# Patient Record
Sex: Male | Born: 1940 | Race: White | Hispanic: No | Marital: Married | Smoking: Former smoker
Health system: Southern US, Community
[De-identification: ages and names within clinical notes are randomized; demographics above are authoritative.]

## PROBLEM LIST (undated history)

## (undated) DIAGNOSIS — K219 Gastro-esophageal reflux disease without esophagitis: Secondary | ICD-10-CM

## (undated) DIAGNOSIS — Z85828 Personal history of other malignant neoplasm of skin: Secondary | ICD-10-CM

## (undated) DIAGNOSIS — Z9109 Other allergy status, other than to drugs and biological substances: Secondary | ICD-10-CM

## (undated) DIAGNOSIS — E785 Hyperlipidemia, unspecified: Secondary | ICD-10-CM

## (undated) DIAGNOSIS — H269 Unspecified cataract: Secondary | ICD-10-CM

## (undated) DIAGNOSIS — F329 Major depressive disorder, single episode, unspecified: Secondary | ICD-10-CM

## (undated) DIAGNOSIS — F32A Depression, unspecified: Secondary | ICD-10-CM

## (undated) DIAGNOSIS — I2510 Atherosclerotic heart disease of native coronary artery without angina pectoris: Secondary | ICD-10-CM

## (undated) DIAGNOSIS — M5430 Sciatica, unspecified side: Secondary | ICD-10-CM

## (undated) DIAGNOSIS — T7840XA Allergy, unspecified, initial encounter: Secondary | ICD-10-CM

## (undated) DIAGNOSIS — D649 Anemia, unspecified: Secondary | ICD-10-CM

## (undated) DIAGNOSIS — G587 Mononeuritis multiplex: Secondary | ICD-10-CM

## (undated) DIAGNOSIS — I10 Essential (primary) hypertension: Secondary | ICD-10-CM

## (undated) DIAGNOSIS — C61 Malignant neoplasm of prostate: Secondary | ICD-10-CM

## (undated) DIAGNOSIS — H3530 Unspecified macular degeneration: Secondary | ICD-10-CM

## (undated) DIAGNOSIS — F419 Anxiety disorder, unspecified: Secondary | ICD-10-CM

## (undated) DIAGNOSIS — G4730 Sleep apnea, unspecified: Secondary | ICD-10-CM

## (undated) DIAGNOSIS — M1990 Unspecified osteoarthritis, unspecified site: Secondary | ICD-10-CM

## (undated) HISTORY — PX: BACK SURGERY: SHX140

## (undated) HISTORY — PX: COLONOSCOPY: SHX174

## (undated) HISTORY — DX: Essential (primary) hypertension: I10

## (undated) HISTORY — DX: Anxiety disorder, unspecified: F41.9

## (undated) HISTORY — DX: Atherosclerotic heart disease of native coronary artery without angina pectoris: I25.10

## (undated) HISTORY — DX: Mononeuritis multiplex: G58.7

## (undated) HISTORY — DX: Hyperlipidemia, unspecified: E78.5

## (undated) HISTORY — DX: Unspecified macular degeneration: H35.30

## (undated) HISTORY — DX: Anemia, unspecified: D64.9

## (undated) HISTORY — DX: Allergy, unspecified, initial encounter: T78.40XA

## (undated) HISTORY — DX: Other allergy status, other than to drugs and biological substances: Z91.09

## (undated) HISTORY — PX: POLYPECTOMY: SHX149

## (undated) HISTORY — DX: Unspecified cataract: H26.9

---

## 1996-04-10 HISTORY — PX: ELBOW SURGERY: SHX618

## 1997-09-10 ENCOUNTER — Ambulatory Visit (HOSPITAL_COMMUNITY): Admission: RE | Admit: 1997-09-10 | Discharge: 1997-09-10 | Payer: Self-pay | Admitting: Neurosurgery

## 1997-11-09 ENCOUNTER — Inpatient Hospital Stay (HOSPITAL_COMMUNITY): Admission: RE | Admit: 1997-11-09 | Discharge: 1997-11-12 | Payer: Self-pay | Admitting: Neurosurgery

## 1997-11-18 ENCOUNTER — Inpatient Hospital Stay (HOSPITAL_COMMUNITY): Admission: EM | Admit: 1997-11-18 | Discharge: 1997-11-21 | Payer: Self-pay | Admitting: Emergency Medicine

## 1998-02-01 ENCOUNTER — Encounter: Admission: RE | Admit: 1998-02-01 | Discharge: 1998-05-02 | Payer: Self-pay | Admitting: Neurosurgery

## 2000-04-10 DIAGNOSIS — I2510 Atherosclerotic heart disease of native coronary artery without angina pectoris: Secondary | ICD-10-CM

## 2000-04-10 HISTORY — PX: ANGIOPLASTY: SHX39

## 2000-04-10 HISTORY — DX: Atherosclerotic heart disease of native coronary artery without angina pectoris: I25.10

## 2000-09-24 ENCOUNTER — Encounter: Payer: Self-pay | Admitting: Emergency Medicine

## 2000-09-24 ENCOUNTER — Inpatient Hospital Stay (HOSPITAL_COMMUNITY): Admission: EM | Admit: 2000-09-24 | Discharge: 2000-09-26 | Payer: Self-pay | Admitting: Emergency Medicine

## 2000-09-27 ENCOUNTER — Observation Stay (HOSPITAL_COMMUNITY): Admission: EM | Admit: 2000-09-27 | Discharge: 2000-09-28 | Payer: Self-pay | Admitting: Emergency Medicine

## 2000-09-27 ENCOUNTER — Encounter: Payer: Self-pay | Admitting: Emergency Medicine

## 2000-12-15 ENCOUNTER — Encounter: Payer: Self-pay | Admitting: Internal Medicine

## 2000-12-15 ENCOUNTER — Inpatient Hospital Stay (HOSPITAL_COMMUNITY): Admission: EM | Admit: 2000-12-15 | Discharge: 2000-12-18 | Payer: Self-pay | Admitting: Emergency Medicine

## 2001-01-01 ENCOUNTER — Encounter (HOSPITAL_COMMUNITY): Admission: RE | Admit: 2001-01-01 | Discharge: 2001-04-01 | Payer: Self-pay | Admitting: Cardiology

## 2001-01-02 ENCOUNTER — Ambulatory Visit (HOSPITAL_COMMUNITY): Admission: RE | Admit: 2001-01-02 | Discharge: 2001-01-02 | Payer: Self-pay | Admitting: Cardiology

## 2001-02-05 ENCOUNTER — Encounter: Payer: Self-pay | Admitting: Emergency Medicine

## 2001-02-05 ENCOUNTER — Observation Stay (HOSPITAL_COMMUNITY): Admission: EM | Admit: 2001-02-05 | Discharge: 2001-02-06 | Payer: Self-pay | Admitting: Emergency Medicine

## 2001-07-10 ENCOUNTER — Encounter: Payer: Self-pay | Admitting: Neurosurgery

## 2001-07-10 ENCOUNTER — Encounter: Admission: RE | Admit: 2001-07-10 | Discharge: 2001-07-10 | Payer: Self-pay | Admitting: Neurosurgery

## 2001-07-24 ENCOUNTER — Ambulatory Visit (HOSPITAL_COMMUNITY): Admission: RE | Admit: 2001-07-24 | Discharge: 2001-07-25 | Payer: Self-pay | Admitting: Neurosurgery

## 2001-07-24 ENCOUNTER — Encounter: Payer: Self-pay | Admitting: Neurosurgery

## 2001-08-28 ENCOUNTER — Encounter: Admission: RE | Admit: 2001-08-28 | Discharge: 2001-09-12 | Payer: Self-pay | Admitting: Neurosurgery

## 2001-12-01 ENCOUNTER — Emergency Department (HOSPITAL_COMMUNITY): Admission: EM | Admit: 2001-12-01 | Discharge: 2001-12-01 | Payer: Self-pay | Admitting: Emergency Medicine

## 2001-12-01 ENCOUNTER — Encounter: Payer: Self-pay | Admitting: Emergency Medicine

## 2002-03-13 ENCOUNTER — Ambulatory Visit (HOSPITAL_COMMUNITY): Admission: RE | Admit: 2002-03-13 | Discharge: 2002-03-13 | Payer: Self-pay | Admitting: Family Medicine

## 2002-03-13 ENCOUNTER — Encounter: Payer: Self-pay | Admitting: Family Medicine

## 2002-03-29 ENCOUNTER — Encounter: Payer: Self-pay | Admitting: Emergency Medicine

## 2002-03-29 ENCOUNTER — Inpatient Hospital Stay (HOSPITAL_COMMUNITY): Admission: EM | Admit: 2002-03-29 | Discharge: 2002-03-30 | Payer: Self-pay | Admitting: Emergency Medicine

## 2002-03-31 ENCOUNTER — Ambulatory Visit (HOSPITAL_COMMUNITY): Admission: RE | Admit: 2002-03-31 | Discharge: 2002-03-31 | Payer: Self-pay | Admitting: Neurology

## 2002-03-31 ENCOUNTER — Encounter: Payer: Self-pay | Admitting: Neurology

## 2002-04-20 ENCOUNTER — Inpatient Hospital Stay (HOSPITAL_COMMUNITY): Admission: EM | Admit: 2002-04-20 | Discharge: 2002-04-21 | Payer: Self-pay

## 2003-01-09 ENCOUNTER — Emergency Department (HOSPITAL_COMMUNITY): Admission: EM | Admit: 2003-01-09 | Discharge: 2003-01-09 | Payer: Self-pay | Admitting: Emergency Medicine

## 2003-10-23 ENCOUNTER — Ambulatory Visit (HOSPITAL_COMMUNITY): Admission: RE | Admit: 2003-10-23 | Discharge: 2003-10-23 | Payer: Self-pay | Admitting: Family Medicine

## 2003-11-11 ENCOUNTER — Inpatient Hospital Stay (HOSPITAL_COMMUNITY): Admission: RE | Admit: 2003-11-11 | Discharge: 2003-11-14 | Payer: Self-pay | Admitting: Neurosurgery

## 2004-03-08 ENCOUNTER — Observation Stay (HOSPITAL_COMMUNITY): Admission: EM | Admit: 2004-03-08 | Discharge: 2004-03-09 | Payer: Self-pay | Admitting: Emergency Medicine

## 2005-01-08 ENCOUNTER — Ambulatory Visit (HOSPITAL_COMMUNITY): Admission: RE | Admit: 2005-01-08 | Discharge: 2005-01-08 | Payer: Self-pay | Admitting: *Deleted

## 2005-01-17 ENCOUNTER — Ambulatory Visit (HOSPITAL_COMMUNITY): Admission: RE | Admit: 2005-01-17 | Discharge: 2005-01-18 | Payer: Self-pay | Admitting: Neurosurgery

## 2006-10-08 ENCOUNTER — Inpatient Hospital Stay (HOSPITAL_COMMUNITY): Admission: EM | Admit: 2006-10-08 | Discharge: 2006-10-10 | Payer: Self-pay | Admitting: Emergency Medicine

## 2006-10-08 ENCOUNTER — Ambulatory Visit: Payer: Self-pay | Admitting: *Deleted

## 2009-09-09 ENCOUNTER — Emergency Department (HOSPITAL_COMMUNITY): Admission: EM | Admit: 2009-09-09 | Discharge: 2009-09-09 | Payer: Self-pay | Admitting: Emergency Medicine

## 2010-05-01 ENCOUNTER — Encounter: Payer: Self-pay | Admitting: Family Medicine

## 2010-08-23 NOTE — H&P (Signed)
NAME:  Mark [name], Mark [name] NO.:  [id number]   MEDICAL RECORD NO.:  [id number]          PATIENT TYPE:  EMS   LOCATION:  MAJO                         FACILITY:  [facility]   PHYSICIAN:  Elmore Guise., M.D.DATE OF BIRTH:  05-06-1940   DATE OF ADMISSION:  10/08/2006  DATE OF DISCHARGE:                              HISTORY & PHYSICAL   INDICATION FOR ADMISSION:  Chest pain.   PRIMARY CARDIOLOGIST:  Armanda Magic, M.D.   HISTORY OF PRESENT ILLNESS:  [name] is a very pleasant 70 year old  white male with a past medical history of coronary artery disease,  status post PCI of his mid LAD in 2002 (hypertension, dyslipidemia); who  presents with chest pain.  The patient reports that his symptoms started  this morning and continued throughout the day.  He initially describes  it as an ache in the left chest area, associated with shortness of  breath and diaphoresis.  This progressed to a burning in the left chest,  also associated with shortness of breath, diaphoresis and fatigue.  He  states that the burning was similar to what he has had in the past.   He denies any orthopnea or PND; no palpitations.  He typically tries to  exercise daily, by either walking on the treadmill or exercise bike.  However, lately he has been too tired and too short of breath to do his  normal activities.  He does report increased stress at home.  He denies  any problems with his medications.  No recent fever.  He has had a  nonproductive cough.  He also reports mild cramping in his lower  extremities, and he thinks this is secondary to decreased fluid intake.  He has had no nausea, vomiting or diarrhea.  Review of systems are  otherwise negative.   CURRENT MEDICATIONS:  1. Simcor once daily.  2. Diovan once daily.  3. Aspirin once daily.  4. Zetia once daily.  5. Prozac 40 mg daily.  6. Xanax p.r.n.  7. Hydrochlorothiazide 25 mg daily.   ALLERGIES:  CODEINE.   FAMILY HISTORY:   Positive for heart disease, diabetes mellitus and  hypertension -- all in his mother and brother.   SOCIAL HISTORY:  He is married.  He has a remote history of tobacco,  quit 28 years ago.  He has an occasional alcohol use.   PHYSICAL EXAMINATION:  VITAL SIGNS:  He is afebrile, temperature 97.3;  blood pressure 105/60, heart rate 76 and regular.  He is satting 96% on  room air.  GENERAL:  He is a very pleasant middle-aged white male; alert and  oriented x4 and in no acute distress.  HEENT:  Appears normal  NECK:  Supple, no lymphadenopathy.  Two-plus carotids.  No JVD and no  bruits.  LUNGS:  Clear.  HEART:  Regular with a normal S1, S2.  No significant murmur, gallop or  rub.  ABDOMEN:  Soft and nontender, nondistended.  No rebound or guarding.  EXTREMITIES:  Warm with 2+ pulses and no edema.   ECHOCARDIOGRAM:  Shows normal sinus rhythm.  Normal axis.  Normal  intervals, with no ST or T-wave changes.  This is unchanged from his  prior tracings.   CHEST X-RAY:  Pending at the time of dictation.   LABORATORY STUDIES:  Coags are normal, with a PT 14.1, INR 1.1, PTT 29.  White count 7.1, hemoglobin 13.4, platelet count 269.  Myoglobin 79, MB  1.2, troponin I less than 0.05.  Potassium 3.7, BUN 23, creatinine 1.2.  His last stress test was done over 3 years ago, per patient.  At that  time he was referred for catheterization.   His last catheterization was done April 21, 2002.  The left main was  normal.  The LAD is large and wraps around the left ventricular apex.  There is 20% narrowing of the proximal third of the LAD stent.  The  stent, however, is widely patent.  There is a small diagonal which  arises proximal to the stent and contains an ostial 30-40% narrowing.  Circumflex is widely patent, giving an origin of 4 obtuse marginals;  without any significant obstructive disease.  His RCA has no significant  obstructive disease, just with mild luminal irregularities.  His EF  was  calculated greater than 60%.   IMPRESSION:  1. Chest pain, with typical and atypical qualities.  2. Fatigue.  3. Shortness of breath.  4. Dyslipidemia.  5. History of coronary artery disease, status post percutaneous      coronary intervention of his mid left anterior descending artery.   PLAN:  The patient will be admitted to telemetry bed for rule out  myocardial infarction.  He will be given Lovenox 1 mg/kg subcutaneously  b.i.d.  Continue nitroglycerin drip.  Start low-dose metoprolol, as  blood pressure and heart rate tolerate; as well as continue his statin  at this time.  We will check a TSH, D-dimer and chest x-ray.  Further  measures per Dr. Mayford Knife.      Elmore Guise., M.D.  Electronically Signed     TWK/MEDQ  D:  10/08/2006  T:  10/08/2006  Job:  [id number]   cc:   Armanda Magic, M.D.

## 2010-08-26 NOTE — Discharge Summary (Signed)
NAME:  Mark [name], Mark [name]             ACCOUNT NO.:  [id number]   MEDICAL RECORD NO.:  [id number]          PATIENT TYPE:  INP   LOCATION:  3709                         FACILITY:  [facility]   PHYSICIAN:  Armanda Magic, M.D.     DATE OF BIRTH:  1940/11/30   DATE OF ADMISSION:  10/08/2006  DATE OF DISCHARGE:  10/10/2006                               DISCHARGE SUMMARY   DISCHARGE DIAGNOSES:  Dictation ended at this point.      Guy Franco, P.A.      Armanda Magic, M.D.  Electronically Signed    LB/MEDQ  D:  11/21/2006  T:  11/21/2006  Job:  [id number]

## 2010-08-26 NOTE — Discharge Summary (Signed)
NAME:  Mark [name], Mark [name]                       ACCOUNT NO.:  [id number]   MEDICAL RECORD NO.:  [id number]                   PATIENT TYPE:  INP   LOCATION:  2020                                 FACILITY:  [facility]   PHYSICIAN:  Armanda Magic, M.D.                  DATE OF BIRTH:  1940/04/26   DATE OF ADMISSION:  04/19/2002  DATE OF DISCHARGE:  04/21/2002                                 DISCHARGE SUMMARY   ADMISSION DIAGNOSES:  1. Atypical chest pain, rule out myocardial infarction.  2. Known coronary artery disease, status post LAD stenting 2002.  3. Anxiety disorder.  4. Hypertension.  5. Benign prostatic hypertrophy.  6. Gastroesophageal reflux disease with negative EGD August 2002.  7. Hypertriglyceridemia.   DISCHARGE DIAGNOSES:  1. Atypical chest pain resolved, myocardial infarction ruled out with     negative enzymes.  2. Cardiac catheterization on 04/21/2002 -  widely patent coronary arteries,     patent LAD stent.  3. Known coronary artery disease, status post LAD stenting 2002.  4. Anxiety disorder.  5. Hypertension.  6. Benign prostatic hypertrophy.  7. Gastroesophageal reflux disease with negative EGD August 2002.  8. Hypertriglyceridemia.   HISTORY OF PRESENT ILLNESS:  Mark [name] is 70 year old gentleman with a  history of coronary artery disease, status post mid-LAD stenting 12/17/00 by  Dr. Katrinka Blazing.  He has done well since that time.  However, shortly before  Christmas, he began experiencing left precordial aching discomfort which is  almost continuous.  He saw Dr. Mayford Knife and a Cardiolite was performed at  [facility] which was reportedly abnormal.  As best can be told from  the discussion, it demonstrated abnormality in the bottom wall of the heart.  He had improved again and scheduled to have a cardiac catheterization by Dr.  Fraser Din on 04/22/02.   The patient came to the emergency room early this morning because of  continued and worsening discomfort in the  chest.  He was admitted to rule  out MI.  He was still having some mild discomfort although he states it has  improved on nitroglycerin.  There is no nausea, vomiting or diaphoresis.  Mild aching in the left arm.   The patient will be admitted for atypical chest discomfort.  EKG  unremarkable.  He was placed on heparin per pharmacy and nitroglycerin.   PROCEDURES:  Cardiac catheterization 04/21/02 by Dr. Katrinka Blazing.   COMPLICATIONS:  None.   CONSULTATIONS:  None.   COURSE IN THE HOSPITAL:  Mark [name] was admitted to [facility]  through the emergency room on 04/20/02 for symptoms of atypical chest pain.  EKG was negative.  Initial cardiac enzymes negative.  The only significant  laboratory finding was a potassium of 3.5 which was repleted.   Early on the morning of 04/21/02 the patient had a drop in heart rate to 34 -  not sustained and returned to the  60s.  The patient was asymptomatic and was  in the bathroom at this time.  No further workup was recommended at this  point.   Lipid profile showed a total cholesterol of 164, triglycerides 233, HDL 45  and LDL 72.  Dr. Mayford Knife recommended increasing omega-3 fatty acids to 3  grams t.i.d.  Recheck liver profile  in eight weeks.   On 04/21/02 Dr. Katrinka Blazing performed a cardiac catheterization.  This revealed a  normal LV.  Left main normal, LAD with a 25% in-stent restenosis, circumflex  normal, and RCA without any significant disease.  He recommended continued  medical therapy.  Noncardiac etiology of chest pain may be reflux or  musculoskeletal.  Recommend follow up with primary care Viridiana Spaid.   On 04/21/02 the patient was felt to able to be discharged to home  postcatheterization.  Groin remained stable.   MEDICATIONS:  1. Omega-3 fatty acids 3 grams t.i.d.  2. Zocor 20 mg.  3. Norvasc 7.5 mg.  4. Prozac 20 mg.  5. Diovan.  6. HCT 12.5 mg.  7. Bextra 10 mg.  8. Xanax as needed.  Take as directed.  9. Multivitamins.  10.       Fibercon.  11.      Vitamin E.  12.      Coated aspirin 81 mg a day.   DISCHARGE ACTIVITIES:  No strenuous activity, lifting more than 5 pounds or  driving for two days.  May return to work Thursday.   DIET:  Low-fat, low-cholesterol, low-salt, low-sugar, low-carb diet.   DISCHARGE INSTRUCTIONS:  No shower.   He is asked to call the office with any problems or questions.   FOLLOW UP:  We would like to have the patient see Dr. Mayford Knife back in 2-3  weeks and this will be scheduled before the patient is discharged.  He will  need an eight week followup with a profile.     Georgiann Cocker Jernejcic, P.A.                   Armanda Magic, M.D.    TCJ/MEDQ  D:  04/21/2002  T:  04/21/2002  Job:  [id number]

## 2010-08-26 NOTE — H&P (Signed)
NAME:  Mark [name], Mark [name]                       ACCOUNT NO.:  [id number]   MEDICAL RECORD NO.:  [id number]                   PATIENT TYPE:  INP   LOCATION:  2020                                 FACILITY:  [facility]   PHYSICIAN:  Lesleigh Noe, M.D.            DATE OF BIRTH:  [birth year]   DATE OF ADMISSION:  04/19/2002  DATE OF DISCHARGE:                                HISTORY & PHYSICAL   REASON FOR ADMISSION:  Chest pain.   HISTORY OF PRESENT ILLNESS:  The patient is a very pleasant 70 year old  gentleman who has a history of coronary artery disease. He is status post  mid LAD stent on December 17, 2000, by Dr. Verdis Prime. He has done well  since that time. His symptom related to the LAD was a substernal burning  discomfort.   Since shortly before Christmas he has been experiencing left precordial  aching discomfort that is nearly continuous. He saw Dr. Mayford Knife, his primary  cardiologist about this, and a Cardiolite was performed in the [facility] office was abnormal. As best I can tell from discussion with the  patient, it demonstrated abnormality on the bottom wall of his heart.  He  has been previously scheduled to have cardiac catheterization by Dr. Meade Maw on April 22, 2002.   He came to the emergency room earlier this morning became continued and  worsening discomfort in his chest. He was admitted to rule out myocardial  infarction. He is still having some mild discomfort, although he states it  is improved on nitroglycerin. There is no nausea or vomiting or diaphoresis.  There is some mild aching into his left arm.   ALLERGIES:  None.   PAST MEDICAL HISTORY:  1. Coronary atherosclerotic heart disease, status post mid LAD stent in     September 2002.  2. Anxiety disorder.  3. Hypertension.  4. History of back surgery in 1998 and also in April 2003.  5. Benign prostatic hypertrophy.  6. Gastroesophageal reflux disease with unremarkable  esophagogastroduodenoscopy in August 2002.   CURRENT MEDICATIONS:  1. Norvasc 7.5 mg q.d.  2. Prozac 20 mg q.d.  3. Diovan dose unknown; I believe it is 80 mg q.d.  4. Hydrochlorothiazide 12.5 mg q.d.  5. Zocor 20 mg q.d.  6. Bextra 10 mg q.d.  7. Xanax 0.25 mg p.o. b.i.d. p.r.n. anxiety.  8. Vitamin E.  9. Multiple vitamins.  10.      Fibercon.  11.      Fish oil tablets over-the-counter.   FAMILY HISTORY:  Father died of lung cancer. His mother has multiple medical  problems, including COPD, hypertension, breast cancer and thyroid disease.  His brother had a heart attack at age 31.   SOCIAL HISTORY:  The patient is married. He has 2 sons. He is retired from  [employer].   HABITS:  He discontinued smoking 25 years ago. He drinks alcohol socially.  REVIEW OF SYSTEMS:  He has noticed no impairment in his exertional  tolerance. He states that when he was  on the treadmill in our office, there  was no real change in the discomfort he had been having in his chest. He has  not had orthopnea or PND, no neurological symptoms, no claudication. There  has been no pleuritic component to the chest pain. His appetite has been  good. No hemoptysis. No abdominal discomfort. No blood in his urine or  surgical. Neurological review of systems is also negative.   PHYSICAL EXAMINATION:  GENERAL:  The patient was in no acute distress.  VITAL SIGNS:  Blood pressure 108/60, heart rate 60, respirations 16.  SKIN:  Clear.  HEENT:  Unremarkable.  NECK:  No JVD or carotid bruits.  LUNGS:  Clear.  CARDIAC:  No click, no gallop, no rub, no murmur.  ABDOMEN:  Soft, no tenderness. The liver is not palpable.  EXTREMITIES:  No edema. Pulses 2+ but symmetric in upper and lower  extremities.  NEUROLOGIC:  Examination was normal.   LABORATORY DATA:  An EKG is normal. Laboratory data is basically normal. A  chest x-ray has not yet been reviewed personally.   ASSESSMENT:  Atypical chest discomfort in  this patient with a history of an  anxiety disorder. Recent Cardiolite demonstrates a perfusion abnormality.  Considering his prior history of coronary artery disease, repeat diagnostic  cardiac catheterization seems indicated.   PLAN:  Diagnostic cardiac catheterization April 21, 2002. Continue home  medications, IV nitroglycerin, IV heparin.                                               Lesleigh Noe, M.D.    HWS/MEDQ  D:  04/20/2002  T:  04/20/2002  Job:  [id number]   cc:   Armanda Magic, M.D.  [address]  [place], [place] [address]  Fax: [phone]

## 2010-08-26 NOTE — Op Note (Signed)
NAME:  Mark [name], Mark [name]                       ACCOUNT NO.:  [id number]   MEDICAL RECORD NO.:  [id number]                   PATIENT TYPE:  INP   LOCATION:  3734                                 FACILITY:  [facility]   PHYSICIAN:  Coletta Memos, M.D.                  DATE OF BIRTH:  1940-08-11   DATE OF PROCEDURE:  11/11/2003  DATE OF DISCHARGE:                                 OPERATIVE REPORT   PREOPERATIVE DIAGNOSES:  1. Recurrent disk herniation, left L4-5.  2. Left L5 radiculopathy.   POSTOPERATIVE DIAGNOSES:  1. Recurrent disk herniation, left L4-5.  2. Left L5 radiculopathy.   OPERATION PERFORMED:  1. Redo diskectomy L4-5.  2. Posterolateral arthrodesis, L4-5 with morcelized autograft and morcelized     allograft.  3. Pedicle screw fixation, L4-5, three 6.5 x 45 mm Legacy screws, one 6.5 x     50 mm screw.  4. Transverse lumbar interbody fusion with 9 mm Synthes allograft.  Infuse     also used for posterolateral arthrodesis.   SURGEON:  Coletta Memos, M.D.   ASSISTANT:  Hewitt Shorts, M.D.   ANESTHESIA:  General endotracheal.   INDICATIONS FOR PROCEDURE:  Mark [name] is a 70 year old gentleman who  is a patient well known to me.  He has had two previous operations by me at  L4-5 for disk herniations.  He has also had one at L3-4.  He has a recurrent  disk at L4-5 two years after his last operation.  I therefore recommended  and he agreed to undergo a fusion secondary to the multiple recurrences.  He  has no history of back pain.  He did develop a foot drop on the left side  after an epidural injection prior to his second operation.   DESCRIPTION OF PROCEDURE:  Mark [name] was brought to the operating room  intubated and placed under general anesthetic without difficulty.  He was  rolled prone on a Wilson frame after Foley catheter placement under sterile  conditions.  All pressure points were properly padded on body rolls. His  back was prepped and he was draped  in a sterile fashion.  I infiltrated 20  mL 0.5% lidocaine 1:200,000 strength epinephrine  into the lumbar region and  paraspinous musculature.  I opened the old incision using that as a guide  and identified the L4-L5 interlaminar space.  I then exposed the transverse  processes of L4 and L5 bilaterally.  Working from the left side, we already  had a foot drop.  I performed a diskectomy and inferior facetectomy of L4 on  the left.  I drilled out a good portion of the superior facet of L5 in order  to gain lateral access to the disk space.  Both the L4 and L5 nerve roots  were very well and aggressively decompressed.  I sized the disk space after  preparing it with rasps, curets and other instruments  for a 9 mm graft.  I  placed a 9 mm graft without difficulty.  I then took an AP fluoroscopic film  that showed the plug in good position.  I then started with the pedicle  screw fixation using fluoroscopic guidance.  Each screw was done in a  similar fashion.  First a small drill hole was performed.  Then I probed the  pedicle with a pedicle probe and then I tapped and placed a screw in between  each instrument going into the pedicle.  I used a small pedicle finder to  ensure that I had no breech of the pedicle itself.  Two screws were placed  at L5, two screws were placed at L4 without difficulty.  With Dr. Earl Gala  assistance, we placed the rods and locked the screws into position.  Allograft mixed with InFuse was placed laterally over the transverse  processes after they had been decorticated at L4 and L5 and also the lamina  of L4 and L5 on the right side since no laminectomy had been performed.  I  then reapproximated the thoracolumbar fascia, subcutaneous tissue, skin  edges.  Dermabond used for a sterile dressing.  The patient tolerated the  procedure well, moving all extremities postoperatively.                                               Coletta Memos, M.D.    KC/MEDQ  D:   11/11/2003  T:  11/12/2003  Job:  [id number]

## 2010-08-26 NOTE — Cardiovascular Report (Signed)
[place]  Patient:    Mark, [name] Visit Number: [id number] MRN: [id number]          Service Type: MED Location: 3700 [phone] Attending Physician:  Armanda Magic Dictated by:   Armanda Magic, M.D. Proc. Date: 12/17/00 Admit Date:  12/15/2000                          Cardiac Catheterization  PRIMARY CARDIOLOGIST:  Armanda Magic, M.D.  PROCEDURE PERFORMED:  A left heart catheterization, coronary angiography and left ventriculography.  INDICATIONS: Chest pain and coronary disease.  COMPLICATIONS: None.  INTRAVENOUS ACCESS: Via right femoral artery, 6 French sheath.  HISTORY OF PRESENT ILLNESS:  This is a 70 year old, white male, with a previous history of atypical chest pain, status post cardiac catheterization in June of 2002 revealing a 60-70% mid LAD at the takeoff of the first diagonal.  Cardiolite revealed no inducible ischemia and the patient was discharged to home, felt to be secondary to a combination of anxiety, as well as GI etiology. He underwent endoscopy later with no evidence of esophagitis or gastritis and no GI etiology of chest pain. He now presents with recurrent chest pain and ruled out for myocardial infarction with serial enzymes.  He now presents for cardiac catheterization.  DESCRIPTION OF PROCEDURE: The patient is brought to the cardiac catheterization laboratory in a fasting, nonsedated state.  Informed consent was obtained.  The patient was connected to continuous heart rate and pulse oximetry monitoring, and intermittent blood pressure monitoring.  The right groin was prepped and draped in a sterile fashion.  Lidocaine 1% was used for local anesthesia.  Using the modified Seldinger technique, a 6 French sheath was placed in the right femoral artery.  Under fluoroscopic guidance, a 6 French Judkins JL4 catheter was placed in the left coronary artery.  Multiple cine films were taken in the RAO 30 degree, LAO 40  degree views.  This catheter was then exchanged out over a guide wire for a 6 [place] JR4 catheter which was placed in the right coronary artery under fluoroscopic guidance. Multiple cine films were taken in a 30 degree, 40 degree LAO views.  This catheter was then exchanged out over a guide wire for a 6 French angled pigtail catheter which was placed in the left ventricular cavity.  Left ventriculography was performed in a 30 degree RAO view using a total of 30 cc of contrast at 13 cc/sec. The catheter was then pulled back across the aortic valve with no significant gradient noted.  The catheter was then removed over a guide wire.  The sheath was then placed to arterial pressure monitoring. The patient subsequently went on to PCI per Dr. Katrinka Blazing.   RESULTS: 1. The left main coronary artery is widely patent throughout its course and    bifurcates into the left anterior descending artery and left circumflex    artery. 2. The left anterior descending artery is widely patent in its proximal    portion and then gives rise to a first diagonal branch which is widely    patent.  Immediately after the takeoff of the first diagonal branch there    is an 80-90% narrowing of the LAD.  The rest of the LAD is widely patent    throughout its course. 3. Left circumflex is widely patent throughout its course and gives rise to a    first large obtuse marginal branch which is widely patent and  a second    bifurcating large obtuse marginal branch which is widely patent as well. 4. Right coronary artery is widely patent throughout its course and terminates    into a posterior descending artery and posterolateral artery, both of which    are widely patent.  LEFT VENTRICULOGRAM: The left ventriculography performed in a 30 degree RAO view using a total of 30 cc of contrast at 13 cc/sec. showed normal left ventricular size and systolic function.  No mitral regurgitation. Aortic pressure was 160/89, LV pressure  149/8.  ASSESSMENT: 1. One-vessel obstructive coronary disease. 2. Normal left ventricular function.  PLAN: PCI per Dr. Katrinka Blazing. Start Zocor 20 mg a day for LDL of 108.  PLAN: Dictated by:   Armanda Magic, M.D. Attending Physician:  Armanda Magic DD:  12/17/00 TD:  12/17/00 Job: [id number]

## 2010-08-26 NOTE — H&P (Signed)
NAME:  Mark [name], Mark [name] NO.:  [id number]   MEDICAL RECORD NO.:  [id number]          PATIENT TYPE:  INP   LOCATION:  1829                         FACILITY:  [facility]   PHYSICIAN:  Ulyses Amor, MD DATE OF BIRTH:  1940-05-30   DATE OF ADMISSION:  03/08/2004  DATE OF DISCHARGE:                                HISTORY & PHYSICAL   Royalty Domagala is a 70 year old white male who is 70 year old white male  who is admitted to [facility] for further evaluation of chest pain.   The patient has a history of coronary artery disease which dates back to  2002. At that time he underwent stenting of the mid LAD. This was performed  because of presentation with chest pain. The patient has not experienced any  further cardiac events since then. He has been hospitalized on two occasions  since the chest pain. An acute myocardial infarction was excluded on each  occasion. His last cardiac catheterization was during an admission in  January 2004. This demonstrated a patent LAD stent, though approximately 25%  in-stent re-stenosis was noted. Left ventricular function  was normal. There  was no other significant coronary artery disease. It was felt that his chest  pain was noncardiac in origin.   The patient presented to the emergency department this evening after  experiencing chest discomfort which began this morning. It was described as  an ache in his left upper anterolateral chest and it was associated with  numbness in his left arm. It began while he was eating brunch. It continued  throughout the course of the day. He took a total of five nitroglycerin  tablets over the course of the day, but they had no impact on his chest  pain. The chest pain appeared not to be related to position, activity,  meals, or respirations. There were no exacerbating or ameliorating factors.  The chest discomfort eventually resolved after approximately five hours. He  is free of chest  discomfort at this time.  He notes that his chest pain was  different in quality from the chest pain which preceded his stent.   There is no history of myocardial infarction, congestive heart failure, or  arrhythmia.   The patient has a history of hypertension and dyslipidemia. There is no  history of diabetes mellitus. He discontinued smoking approximately 25 years  ago. There is a strong family history of early coronary artery disease; a  brother suffered from coronary artery disease in his 65s.   The patient's past medical history is otherwise notable for gastroesophageal  reflux, benign prostatic hypertrophy, anxiety, and multiple back operations.   The patient's medications include Diovan, nitroglycerin, Norvasc, potassium  chloride, hydrochlorothiazide, Xanax, fluoxetine, aspirin, Advicor, and  Zetia.   ALLERGIES:  None.   PAST SURGICAL HISTORY:  Multiple prior back operations.   SIGNIFICANT INJURIES:  None.   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 He lives  with his wife. He drinks occasionally. He does not smoke.   FAMILY HISTORY:  Early coronary artery disease in a brother as described  above.  REVIEW OF SYSTEMS:  No new problems related to his head, eyes, ears, nose,  mouth, throat, lung, gastrointestinal system, genitourinary system, or  extremities. There is no history of neurologic or psychiatric disorder.  There is no history of fever, chills, or weight loss.   PHYSICAL EXAMINATION:  VITAL SIGNS: Blood pressure is 187/63, pulse 64 and  regular, respirations 20, temperature 97.0.  GENERAL: The patient is a middle-age white male in no discomfort. He is  alert, oriented, appropriate, and responsive.  HEENT: Normal. The neck is without thyromegaly or adenopathy.  NECK: Carotid pulses are palpable bilaterally and without bruits.  CARDIAC: Normal S1 and S2. There is no S3, S4, murmur, rub, or click.  Cardiac rhythm is regular. No chest wall  tenderness noted.  LUNGS: Clear.  ABDOMEN: Soft and nontender. There is no mass, hepatosplenomegaly, bruits,  distention, rebound, guarding, or rigidity. Bowel sounds are normal.  RECTAL/GENITAL EXAM: Not performed as they are not pertinent to the reason  for acute care  hospitalization.  EXTREMITIES: Without edema, deviation, or deformity. Radial and dorsalis  pedis pulses are palpable bilaterally.  NEUROLOGIC: Re-screening neurologic survey is unremarkable.   Electrocardiogram is normal. Chest radiographic had not yet been performed  at the time of this dictation.   The initial set of cardiac markers revealed a myoglobin of 88.0, CK-MB of  1.3, troponin less than 0.05. The second set of cardiac markers revealed a  myoglobin of 64.9, CK-MB 1.2, and troponin less than 0.05.  White count 6.3  with hemoglobin of 13.3, hematocrit 37.5. Potassium is 3.6, BUN 20, and  creatinine 1.0. The remaining studies are pending at the time of this  dictation.   IMPRESSION:  1.  Chest pain; rule out angina. The pain is different in quality for his      pre-stent chest pain.  2.  Coronary artery disease. Status post left anterior descending stent in      2002. Last cardiac catheterization in January 2004 revealed a patent      stent, albeit with a 25% in-stent re-stenosis.  3.  Hypertension.  4.  Dyslipidemia.  5.  Gastroesophageal reflux disease.  6.  Benign prostatic hypertrophy.  7.  Anxiety.  8.  Status post multiple back operations.   PLAN:  1.  Telemetry.  2.  Serial cardiac enzymes.  3.  Aspirin.  4.  Lovenox.  5.  Intravenous nitroglycerin.  6.  Further measures per Dr. Mayford Knife.      Mitc   MSC/MEDQ  D:  03/08/2004  T:  03/09/2004  Job:  [id number]   cc:   Armanda Magic, M.D.  [address], [address]  [place], [place] [address]  Fax: [phone]

## 2010-08-26 NOTE — Discharge Summary (Signed)
[place]. [facility]  Patient:    Mark [name], Mark [name] Visit Number: [id number] MRN: [id number]          Service Type: MED Location: [phone] Attending Physician:  Armanda Magic Dictated by:   Anselm Lis, N.P. Admit Date:  12/15/2000 Discharge Date: 12/18/2000   CC:         Meredith Staggers, M.D., [facility]   Discharge Summary  DATE OF BIRTH:  [birth year].  PROCEDURES: 1. December 17, 2000, cardiac catheterization per Armanda Magic, M.D.,    revealing single vessel CAD with 80% to 90% mid LAD. 2. December 17, 2000, stent LAD by Darci Needle, M.D.  [facility] DIAGNOSIS/BRIEF HOSPITAL COURSE:  #1 - CORONARY ATHEROSCLEROTIC HEART DISEASE:  Single vessel.  The patient admitted with prolonged chest discomfort, some radiation to left arm and jaw, also some moving around in the chest as well.  Improvement of discomfort on IV nitrate and with morphine.  States symptoms similar to presentation in June of 2002, at which time he underwent cardiac catheterization which revealed borderline significant 70% left anterior descending.   A follow-up stress Cardiolite was negative.  The patient ruled out for myocardial infarction by negative serial cardiac enzymes.  His EKG and chest x-ray were negative.  The patient continued with complaint of chest discomfort, thus was taken for cardiac catheterization December 17, 2000, revealing 80 to 90% mid left anterior descending with normal left ventricular function.  Subsequent stent successfully performed on that culprit area by Dr. Verdis Prime.  The following day, the patient noted chest discomfort significantly improved, although there was still some slight twinge pressure.  It is questioned whether the left anterior descending was the true culprit of the patients atypical discomfort.  The patient was discharged home after ambulating up and about with good tolerance.  He was started on Zocor  with plans for fasting Statin profile in about six weeks. His baseline liver function tests were within normal range.  #2 - HISTORY OF ANXIETY:  On Prozac.  #3 - HISTORY OF HYPERTENSION:  Good control on current medical regimen.  #4 - HISTORY OF BACK SURGERY IN 1998, LEFT ELBOW TENDINITIS IN 1997.  #5 - BENIGN PROSTATIC HYPERTROPHY.  #6 - RECENT ESOPHAGOGASTRODUODENOSCOPY:  This was done by Barbette Hair. Arlyce Dice, M.D., which was unremarkable in August of 2002.  PLAN: 1. The patient discharged home in stable condition. 2. Discharge medications:    a. Glucosamine chondroitin once daily.    b. (New) Plavix 75 mg one p.o. q.d. x 3 weeks, take with food.    c. (New) Zocor 20 mg p.o. q.h.s.    d. Prozac 20 mg capsule q.d.    e. Enteric coated aspirin 325 mg once daily.    f. Norvasc 7.5 mg; one half of a 5 mg tablet once daily.    g. Multivitamin and vitamin E once daily.    h. Nitroglycerin tablet 0.4 mg sublingual p.r.n. chest pain q.5       minutes, up to three tablets in 15 minutes.    iSena Slate once daily two tablets. 3. Activity:  No heavy lifting or pushing greater than 10 to 15 pounds    or driving for two days then as before. 4. Diet:  Low fat, low cholesterol. 5. Wound care:  May shower. 6. Special instructions:  The patient to call our clinic if he develops a    large amount of swelling or bruising in the  groin area. 7. Follow-up:  Armanda Magic, M.D., Thursday, December 27, 2000, at 10:30    a.m.  The patient will have a fasting Statin profile Monday,    January 28, 2001, in the morning.  Follow-up of cholesterol/LFTs with    initiation of Zocor therapy.  LABORATORY TESTS AND DATA:  Admission sodium 136, potassium 3.6, subsequent measurement 3.8.  Chloride 105, CO2 23, glucose 118, BUN 16, creatinine 1.1; subsequently 16 and 0.9, respectively.  Calcium and LFTs within normal range. Serial cardiac enzymes were negative x 4 sets.  Admission wbc 6.1, hemoglobin 14.2,  hematocrit 40.2, platelets 291.  Admission coags were within normal range.  Chest x-ray revealed no active disease.  Admission chest x-ray revealed sinus rhythm without ischemic changes. Dictated by:   Anselm Lis, N.P. Attending Physician:  Armanda Magic DD:  01/23/01 TD:  01/24/01 Job: [id number]

## 2010-08-26 NOTE — Cardiovascular Report (Signed)
[place]. [facility]  Patient:    Mark [name], Mark [name]                    MRN: [id number] Proc. Date: 09/25/00 Adm. Date:  [id number] Attending:  Anastasio Auerbach CC:         Anastasio Auerbach, M.D.  Meredith Staggers, M.D.   Cardiac Catheterization  REFERRING PHYSICIAN:  Anastasio Auerbach, M.D.  CHIEF COMPLAINT:  Chest pain.  PROCEDURE:  Left heart catheterization.  OPERATOR:  Armanda Magic, M.D.  INDICATIONS:  Chest pain with positive cardiac risk factors.  COMPLICATIONS:  None.  IV ACCESS:  Via right femoral artery.  HISTORY:  This is a 70 year old white male with no previous cardiac history, but with a strong family history of coronary disease at an early age, who presents with typical angina.  He ruled out for myocardial infarction by serial enzymes.  He now presents for cardiac catheterization.  DESCRIPTION OF PROCEDURE:  The patient was taken to the cardiac catheterization laboratory in the fasting nonsedated state.  Informed consent was obtained.  The patient was connected to continuous heart rate and pulse oximetry monitoring and intermittent blood pressure monitoring.  The right and left groins were prepped and draped in a sterile fashion.  One percent Xylocaine was used for local anesthesia.  Using the modified Seldinger technique, a 6-French sheath was placed in the right femoral artery.  Under fluoroscopic guidance a 6-French JL-4 catheter was placed in the left coronary artery.  Multiple cine films were taken in 30 degree LAO and 40 degree RAO views.  This catheter was then exchanged out over a guide wire for a 6-French angled pigtail catheter, which was placed in the left ventricular cavity. Left ventriculography was performed in the 30 degree RAO view.  The catheter was then pulled back across the aortic valve.  No significant pressure gradient was obtained.  The catheter was then exchanged out over a guide wire. At the end of the procedure, all  catheters and sheaths were removed.  Manual compression was performed until adequate hemostasis was obtained.  The patient was transferred back to his room in stable condition.  RESULTS: 1. The left main coronary artery is widely patent throughout its course and    bifurcates into a left anterior descending and left circumflex artery. 2. The left anterior descending artery gives rise to one large diagonal branch    which is widely patent.  The mid LAD has a 70% narrowing and then is patent    throughout the rest of its course. 3. The left circumflex artery is widely patent throughout its course and gives    rise to a first large obtuse marginal branch which is widely patent and    then a second bifurcating very large obtuse marginal branch which is widely    patent.  The left circumflex is widely patent throughout its course. 4. The right coronary artery is widely patent throughout its course, giving    rise to an acute marginal branch and then bifurcating into a posterior    descending artery and posterolateral artery. 5. Left ventriculography performed in the 30 degree RAO view showed normal    left ventricular function.  Left ventricular pressure was 194/21 mmHg.    Aortic pressure was 194/107 mmHg.  IMPRESSION: 1. One-vessel obstructive coronary artery disease. 2. Normal left ventricular function.  PLAN:  Review of films by Dr. Amil Amen in the morning with probable PCI stent in the morning.  Continue Plavix, aspirin, heparin, nitroglycerin, and oxygen. We will start Norvasc 5 mg a day for blood pressure control. DD:  09/25/00 TD:  09/25/00 Job: [id number]

## 2010-08-26 NOTE — Discharge Summary (Signed)
[place]. [facility]  Patient:    Mark [name], Mark [name]                    MRN: [id number] Adm. Date:  [id number] Disc. Date: [id number] Attending:  Armanda Magic CC:         Meredith Staggers, M.D.  Armanda Magic, M.D.   Discharge Summary  DATE OF BIRTH:  24-Mar-1941  DISCHARGE DIAGNOSES: 1. Atypical chest pain.    a. Cardiac catheterization, 60-70% left anterior descending.    b. Exercise Cardiolite, no evidence of ischemia or scar, normal ejection       fraction. 2. Questionable gastroesophageal reflux disease. 3. Hyperglycemia, on D-5 intravenous fluids.    a. Glycohemoglobin 5.5%. 4. Mild dyslipidemia.    a. Total cholesterol 187, LDL 108. 5. Chronic low back pain.    a. Radiating symptoms to the left leg.    b. Status post surgery by Dr. Franky Macho, 1999. 6. Left elbow tendinitis. 7. Benign prostatic hypertrophy. 8. Former smoker, quit 22 years ago.  DISCHARGE MEDICATIONS: 1. Enteric-coated aspirin 325 mg q.d. 2. Protonix 40 mg 1 p.o. b.i.d. x 2 weeks, then 1 p.o. q.d. 3. Imdur 30 mg q.d. 4. Norvasc 5 mg q.d. 5. Nitroglycerin 0.4 sublingual p.r.n. chest pain. 6. Multivitamin daily. 7. Tylenol as needed for pain. *  No Bextra until follow-up with Dr. Dayton Scrape (COX-2 inhibitor).  CONDITION ON DISCHARGE:  Stable.  DISPOSITION:  Home with family.  ACTIVITY:  As tolerated.  DIET:  Low fat, low salt.  FOLLOW-UP: 1. Dr. Arsenio Loader on Wednesday, October 03, 2000, at 9:10.  Reassess epigastric    burning.  If persistent, consider GI referral.  If burning has subsided,    consider restarting Bextra for chronic arthritis pains.  Check blood    pressure. 2. The patient is to contact Dr. Fernanda Drum office to schedule an    appointment in approximately two weeks.  She will be following along for    his mild coronary artery disease.  CONSULTATIONS:  Dr. Armanda Magic.  PROCEDURES: 1. Chest x-ray:  Normal. 2. Cardiac catheterization:  With  60-70% LAD. 3. Exercise Cardiolite:  No ischemia.  No evidence of scar.  Normal ejection    fraction.  HOSPITAL COURSE: #1 - ATYPICAL CHEST PAIN:  [name] is a 70 year old Caucasian gentleman with no history of heart disease who presents with a burning sensation in the anterior part of his chest.  It began on the afternoon prior to presentation. It awakened him early this morning with increased symptoms.  He felt a fullness in his left arm this morning and in his neck.  He had no associated nausea, shortness of breath, or diaphoresis.  His wife noted him to be pale and somewhat fatigued.  On presentation his pain was a 3/10 and, after two nitroglycerin and a GI cocktail, it was pretty much the same.  In the emergency room it was not aggravated by movement, and I could not reproduce it with palpation.  Because of his remote history of tobacco use and the fact that his brother had a heart attack at 18, we felt like he needed to be admitted for serial cardiac enzymes.  EKG and enzymes remained normal. Dr. Mayford Knife was consulted and proceeded with cardiac catheterization.  He did have a 60-70% LAD.  It was unclear as to whether or not this was causing his symptoms, so we proceeded with a stress Cardiolite.  [name] performed  well into capacity, and images revealed no evidence of ischemia or previous infarction and a normal ejection fraction.  [name] was still having mild symptoms of chest discomfort, and I suspected this may be due to some acid reflux after being started on Bextra.  I would use proton pump inhibitors twice a day for two weeks and then once daily.  He is going to see Dr. Dayton Scrape in one week and, if he is still having persistent symptoms, would consider GI referral.  #2 - HYPERTENSION:  [name] presented with a blood pressure of 161/92, with a heart rate of 66.  He was obviously in some discomfort.  He was placed on Norvasc and ultimately Imdur with his mild  coronary artery disease.  I would also encourage him to continue on an aspirin daily.  #3 - HYPERGLYCEMIA:  [name] had elevated sugars, in the 140-150 range on D-5 IV fluids.  We stopped this, and his CBGs were normal, and his glycohemoglobin was normal at 5.5%.  #4 - DYSLIPIDEMIA:  [name] has mild dyslipidemia with a total cholesterol of 187 and an LDL of 108.  I would start with diet therapy unless cardiology feels strongly otherwise.  Follow up per primary care and cardiology.  PERTINENT AND DISCHARGE LABORATORY DATA:  TSH 1.395.  Hemoglobin 14.8, WBC 8300, platelet count 232.  Sodium 139, potassium 3.6, chloride 109, bicarbonate 28, BUN 14, creatinine 0.9, glucose 106.  LFTs normal. DD:  09/27/00 TD:  09/29/00 Job: [id number]

## 2010-08-26 NOTE — Discharge Summary (Signed)
NAME:  Mark [name], Mark [name]                       ACCOUNT NO.:  [id number]   MEDICAL RECORD NO.:  [id number]                   PATIENT TYPE:  INP   LOCATION:  3734                                 FACILITY:  [facility]   PHYSICIAN:  Coletta Memos, M.D.                  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   PROCEDURE:  1.  L4-L5 ________.  2.  L4-L5 posterolateral arthrodesis, L4-L5 pedicle screw fixation      morselized auto and allograft.   DISCHARGE STATUS:  Alive and well.   DISCHARGE DISPOSITION:  Home.   DISCHARGE MEDICATIONS:  Percocet for pain.   HOSPITAL COURSE:  Mr. Mark [name] is a gentleman who presented with  recurrent disk herniation.  I recommended and he agreed to undergo operative  decompression and fusion secondary to that.  He was brought to the operating  room and had an uncomplicated procedure.  Postoperatively, he was doing  quite well. Neurologic examination was unchanged.  His wound was clean and  dry without signs of infection at discharge.  He was given instructions and  is to call for a return appointment.                                                Coletta Memos, M.D.    KC/MEDQ  D:  12/11/2003  T:  12/12/2003  Job:  [id number]

## 2010-08-26 NOTE — Cardiovascular Report (Signed)
[place]. [facility]  Patient:    Mark [name], Mark [name] Visit Number: [id number] MRN: [id number]          Service Type: MED Location: 2000 2030 01 Attending Physician:  Armanda Magic Dictated by:   Armanda Magic, M.D. Proc. Date: 02/05/01 Admit Date:  02/05/2001 Discharge Date: 02/06/2001   CC:         Meredith Staggers, M.D.   Cardiac Catheterization  PRIMARY CARE PHYSICIAN: Meredith Staggers, M.D.  PROCEDURE PERFORMED: Coronary angiography.  INDICATIONS: Chest pain and known coronary disease with previous PTCA stent of the LAD approximately 2 months ago.  INTERVENOUS ACCESS: Via right femoral artery, 6 French sheath.  COMPLICATIONS: None.  HISTORY OF PRESENT ILLNESS: This is a 70 year old white male with a history of coronary disease, status post PTCA stenting of the LAD on December 17, 2000, who now presents with recurrent chest pain identical to what he had prior to his stent. Initial cardiac enzymes were negative. ECG shows no ischemia.  DESCRIPTION OF PROCEDURE: The patient is brought to the cardiac catheterization laboratory in a fasting, nonsedated state.  Informed consent was obtained.  The patient was connected to continuous heart rate and pulse oximetry monitoring, and intermittent blood pressure monitoring. The right groin was prepped and draped in a sterile fashion.  Xylocaine 1% was used for local anesthesia.  Using the modified Seldinger technique, a 6 French sheath was placed in the right femoral artery.  Under fluoroscopic guidance, a 6 [place] JL4 catheter was placed in the left coronary artery. Multiple cine films were taken in 30 degree RAO, 40 degree LAO views. This catheter was then exchanged out over a guide wire for a 6 [place] JR4 catheter, which was placed under fluoroscopic guidance into the right coronary artery. Multiple cine films were taken in a 30 degree RAO, 40 degree LAO views. Left ventriculography was not  performed since the patient had recently had this done and his coronary arteries were normal at this catheterization.  At the of the procedure all catheters and sheaths were removed. Manual compression was performed until adequate hemostasis was obtained. The patient was transferred back to her room in stable condition.  RESULTS: 1. The left main coronary artery is widely patent and bifurcates into a    left anterior descending artery. 2. The left anterior descending artery is widely patent throughout its    course and gives rise to two diagonal branches, both of which are    widely patent. The left anterior descending artery stent is widely patent. 3. Left circumflex is widely patent throughout its course and actually    gives rise to a very large branching obtuse marginal which has three    branches, all of which are widely patent. 4. Right coronary artery is widely patent throughout its course. It gives    rise to two acute marginal branches then bifurcates distally in the    posterior descending artery and posterolateral artery both of which are    widely patent.  IMPRESSION: 1. History of coronary disease, status post percutaneous transluminal coronary    angioplasty and stenting of the left anterior descending in September of    2002. 2. Recurrent atypical chest pain identical to previous chest pain prior to    a stent with negative cardiac enzymes x1 and normal ECG. 3. Normal coronary arteries with patent stent of the left anterior descending.  PLAN: Discharge to home after bed rest, IV fluid, hydration, continue current medications. Protonix 40 mg  a day.  Followup with primary MD to discuss treatment of anxiety which is most likely the etiology of his chest pain since he has had an EGD as well which was negative. Dictated by:   Armanda Magic, M.D. Attending Physician:  Armanda Magic DD:  02/05/01 TD:  02/06/01 Job: [id number]

## 2010-08-26 NOTE — Op Note (Signed)
. [facility]  Patient:    Mark [name], Mark [name] Visit Number: [id number] MRN: [id number]          Service Type: SUR Location: 3000 3014 01 Attending Physician:  Coletta Memos Dictated by:   Mena Goes. Franky Macho, M.D. Proc. Date: 07/24/01 Admit Date:  07/24/2001 Discharge Date: 07/25/2001                             Operative Report  PREOPERATIVE DIAGNOSIS: 1. 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 2. Left L5 radiculopathy.  POSTOPERATIVE DIAGNOSIS: 1. 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 2. Left L5 radiculopathy.  OPERATION PERFORMED:  Left L4-5 redo semihemilaminectomy and diskectomy with microdissection.  COMPLICATIONS:  None.  SURGEON:  Kyle L. Franky Macho, M.D.  ASSISTANT:  Tanya Nones. Jeral Fruit, M.D.  ANESTHESIA:  General endotracheal.  INDICATIONS FOR PROCEDURE:  The patient is a 70 year old gentleman who started having pain in his left buttocks approximately March of 2002.  This got worse and an MRI showed a recurrent disk.  He underwent epidural injection and that may have though it is not clear how prompted progression of weakness in his left dorsiflexors.  He had had a normal exam prior to the steroid injections but it is not at all clear that the steroid injections caused this.  This was only after that time that he noticed the foot drop on the left.  I therefore recommended that he undergo urgent decompression and he has agreed.  DESCRIPTION OF PROCEDURE:  The patient was brought to the operating room, intubated and placed under general anesthesia without difficulty.  He was rolled prone onto a Wilson frame and all pressure points were properly padded. His back was prepped with DuraPrep.  I made a skin incision after infiltrating 6 cc of 0.5% lidocaine 1:200,000 strength epinephrine.  I then exposed the lamina of what I believe to be L4 and 5.  I took an x-ray and they were indeed the lamina of L4 and 5.  Knowing that I was at the correct space, I  then used curets to free the bony edges from soft tissue.  The thecal sac was then clearly identified.  I was then able to go into the lateral gutter and again free any soft tissue attachments.  I retracted the thecal sac medially and took another x-ray to identify the disk space.  Once that was done, Dr. Jeral Fruit assisted and dissecting the L4 nerve root up over what was a very large disk herniation.  We opened that and removed a significant amount of disk material with pituitary rongeurs and nerve hooks and Epstein curets.  After adequate decompression inspected again and another chunk of disk was removed. Inspected one more time and this nerve was felt to be free of any pressure. Irrigated well.  I then reapproximated the wound in layered fashion using Vicryl sutures.  The skin covered with Dermabond for a sterile dressing.  The patient tolerated the procedure without difficulty. Dictated by:   Mena Goes. Franky Macho, M.D. Attending Physician:  Coletta Memos DD:  07/24/01 TD:  07/25/01 Job: [id number]

## 2010-08-26 NOTE — Consult Note (Signed)
. [facility]  Patient:    Mark [name], Mark [name]                    MRN: [id number] Proc. Date: 09/24/00 Adm. Date:  [id number] Attending:  Anastasio Auerbach CC:         Meredith Staggers, M.D.  Anastasio Auerbach, M.D.   Consultation Report  REFERRING PHYSICIAN:  Anastasio Auerbach, M.D.  PRIMARY PHYSICIAN:  Meredith Staggers, M.D.  CHIEF COMPLAINT:  Chest burning.  HISTORY OF PRESENT ILLNESS:  This is a very pleasant 70 year old white male with no previous cardiac history but a strong family history of MI at an early age.  He presented with complaints of chest burning.  Apparently, over the past several weeks, he has had intermittent chest burning with exertion, specifically when mowing the yard.  Yesterday, he developed midsternal chest burning.  It occurred pretty much all through the day.  This morning, he awoke early with worsening chest burning and fullness in his left arm and neck.  He became concerned and presented to the emergency room.  He denied any shortness of breath, nausea or vomiting associated with the symptoms.  Apparently, he was pale and fatigued at the time of the chest burning.  He was given nitroglycerin and GI cocktail without any relief.  PAST MEDICAL HISTORY:  Essentially significant for chronic low back pain.  He has had surgery in the past.  He has left elbow tendonitis and BPH.  MEDICATIONS:  Aspirin q.d., multivitamin, and chondroitin.  SOCIAL HISTORY:  He is married.  He is a nonsmoker.  He used to smoke but quit 22 years ago.  He has occasional beer and wine.  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  FAMILY HISTORY:  Significant for a brother who had a MI at 69.  He also had a dad who died of lung cancer.  His mother has a history of hypertension, COPD, history of breast cancer, and thyroid disease.  REVIEW OF SYSTEMS:  Other than what is stated in the HPI is essentially negative.  PHYSICAL  EXAMINATION:  VITAL SIGNS:  Blood pressure is 166/105 with a heart rate of 55.  GENERAL:  Well-developed, well-nourished white male in no acute distress.  HEENT:  Benign.  NECK:  Supple without lymphadenopathy.  No bruits.  Carotid upstrokes +2 bilaterally.  LUNGS:  Clear to auscultation throughout.  HEART:  Regular rate and rhythm.  No murmurs, rubs, or gallops.  Normal S1 and S2.  ABDOMEN:  Soft, nontender, and nondistended with active bowel sounds.  No hepatosplenomegaly.  EXTREMITIES:  No clubbing, cyanosis, or edema.  Good distal pulses bilaterally.  LABORATORY DATA:  EKG shows normal sinus rhythm.  No ST or T-wave abnormalities.  Chest x-ray shows no active disease.  CK 83 and MB 1.7.  Troponin less than 0.01.  BMET is within normal limits.  IMPRESSION:  Chest pain, very worrisome for unstable angina with normal electrocardiogram and initial enzymes.  He does have multiple cardiac risk factors including a low HDL, strong family history of coronary disease at an early age, remote tobacco use, age, and sex.  Given his cardiac risk factors and symptoms, we will plan on cardiac catheterization in the morning.  Agree with continuing IV heparin, nitroglycerin, and aspirin.  Would hold the beta blocker secondary to decreased heart rate. DD:  09/24/00 TD:  09/25/00 Job: [id number]

## 2010-08-26 NOTE — H&P (Signed)
[place]  Patient:    Mark [name], Mark [name] Visit Number: [id number] MRN: [id number]          Service Type: MED Location: 2000 2038 01 Attending Physician:  Eleanora Neighbor Dictated by:   Jennet Maduro Earl Gala, R.N., A.N.P. Admit Date:  12/15/2000   CC:         Meredith Staggers, M.D.  Armanda Magic, M.D.   History and Physical  CHIEF COMPLAINT: Chest pain.  HISTORY OF PRESENT ILLNESS: Mr. Santone is a 70 year old white male, who presents to the emergency room this morning after a prolonged episode of chest discomfort that has not been relieved with three sublingual nitroglycerin, as well as morphine and as well as the initiation of IV nitroglycerin.  Due to the lingering nature of the discomfort this was the actual reason that made him present on to the emergency room.  Here he was given morphine and subsequently started on IV nitroglycerin and now rates his discomfort approximately 2-3 on a scale of 0/10.  There has been some radiation to the left arm and jaw and it has been moving around in his chest as well.  There has been no associated nausea, vomiting, or diaphoresis, and no exertional symptoms.  His symptoms are somewhat similar to his presentation back in June 2002.  PAST MEDICAL HISTORY:  1. Atherosclerotic cardiovascular disease.  He has known single-vessel     disease of a 70% LAD, followed by negative stress Cardiolite.  2. Anxiety, on Prozac.  3. Hypertension.  4. History of back surgery in 1998.  5. Left elbow tendonitis in 1997.  6. BPH.  7. Recent EGD by Dr. Arlyce Dice which was unremarkable in August 2002.  ALLERGIES: None.  CURRENT MEDICATIONS:  1. Vitamin q.d.  2. Vitamin E q.d.  3. Prozac 20 mg q.d.  4. Norvasc 7.5 mg q.d.  5. Aspirin q.d.  6. He has recently started himself back on Bextra.  FAMILY HISTORY: His father died of lung cancer.  Mother is alive and in poor health, with a history of hypertension, COPD,  breast cancer, and thyroid disease.  He had a brother who has had a heart attack at the age of 63.  SOCIAL HISTORY: He is married.  He has two sons.  He is retired from [employer].  He is a former smoker and does have occasional alcohol use, but not on a routine basis.  REVIEW OF SYSTEMS: He has had no recent fever, flu, or cough-like symptoms. No trouble with constipation or diarrhea.  No shortness of breath.  No palpitations.  No complaints of edema.  He has had some generalized arthritic discomfort and recently placed himself back on Bextra.  He notes that he previously was on Protonix, which was subsequently switched over to Nexium at the time of his EGD.  Those studies were unremarkable.  He has had some burning on urination and has some BPH, and is followed by Dr. [facility] Service, and there was a suggestion of starting Cardura.  Otherwise the Review Of Systems is unremarkable except as stated above.  PHYSICAL EXAMINATION:  GENERAL: He is somewhat anxious.  He is pleasant and quite conversant. Appears to be in no acute distress.  VITAL SIGNS: Blood pressure 140/92, heart rate in the 60s, respirations 18. He is afebrile.  SKIN: Warm and dry.  Color is unremarkable.  HEENT: Unremarkable.  NECK: Supple.  No bruits, no thyromegaly.  No JVD.  LUNGS: Clear to auscultation anteriorly as well as  posteriorly.  There is no chest wall tenderness.  CARDIAC: Regular rhythm.  There is no murmur, rub, or gallop appreciated.  ABDOMEN: Soft, with somewhat hyperactive bowel sounds, and nontender.  There is no organomegaly.  EXTREMITIES: Without edema.  Distal pulses are excellent.  NEUROLOGIC: Intact, with no gross focal deficits.  LABORATORY DATA: Hematocrit 40, WBC 6, platelets 291,000.  CK and troponin are negative.  Chemistries are within normal limits.  EKG shows no acute changes.  Chest x-ray is negative as well.  OVERALL IMPRESSION:  1. Atypical chest pain.  2.  Known single-vessel coronary disease.  3. Hypertension.  4. Anxiety.  PLAN: Will proceed on with admission.  Will check serial enzymes.  Will place him on IV nitroglycerin as well as heparin.  Will give consideration for repeat coronary angiography by Dr. Mayford Knife on Monday. Dictated by:   Jennet Maduro Earl Gala, R.N., A.N.P. Attending Physician:  Eleanora Neighbor DD:  12/15/00 TD:  12/16/00 Job: [id number]

## 2010-08-26 NOTE — Op Note (Signed)
NAME:  Mark [name], Mark [name]                       ACCOUNT NO.:  [id number]   MEDICAL RECORD NO.:  [id number]                   PATIENT TYPE:  INP   LOCATION:  3734                                 FACILITY:  [facility]   PHYSICIAN:  Coletta Memos, M.D.                  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   PROCEDURES:  1.  L4-5 transverse lumbar interbody fusion.  2.  L4-5 posterolateral arthrodesis with Infuse.  3.  L4-5 pedicle screw fixation using Legacy screws.  4.  Morselized autograft and allograft for arthrodesis.   COMPLICATIONS:  None.   SURGEON:  Coletta Memos, M.D.   ASSISTANT:  Hewitt Shorts, M.D.   INDICATIONS FOR PROCEDURE:  This is a patient of mine who has had a previous  recurrent disk herniation.  With this recurrence, I recommended and he  agreed to undergo fusion to obviate the need for further surgery in the  future.   DESCRIPTION OF PROCEDURE:  The patient was brought to the operating room,  intubated and placed under general anesthetic without difficulty.  He was  rolled prone onto body rolls and all pressure points were properly padded.  His back was prepped and he was draped in a sterile fashion.  Using his old  incision as a guide, I opened that and took my initial incision down to the  thoracolumbar fascia.  I then exposed the lamina of L4, L5 and L3.  I took  localizing x-rays to confirm my location and then when that was done  proceeded with a left sided semi-hemilaminectomy and diskectomy.  I would  not disturb the right side as he had never had problems on the right side  and the TLIF could be done simply from the left.  I identified the recurrent  disk herniation and removed that.  Then using  specialized tools from the  Synthes set, I removed disk material from the disk space at L4-5.  This was  done aggressively to remove as much disk as possible.  When I felt that I  had achieved a good bony interface, I then placed a TLIF piece of bone into  the space with that being a 9 mm Synthes allograft.                                               Coletta Memos, M.D.    KC/MEDQ  D:  12/11/2003  T:  12/13/2003  Job:  [id number]

## 2010-08-26 NOTE — Cardiovascular Report (Signed)
NAME:  Mark [name], Mark [name]                       ACCOUNT NO.:  [id number]   MEDICAL RECORD NO.:  [id number]                   PATIENT TYPE:  INP   LOCATION:  2020                                 FACILITY:  [facility]   PHYSICIAN:  Lesleigh Noe, M.D.            DATE OF BIRTH:  1941-03-26   DATE OF PROCEDURE:  04/21/2002  DATE OF DISCHARGE:                              CARDIAC CATHETERIZATION   INDICATIONS FOR PROCEDURE:  Recurrent chest pain, recent abnormal Cardiolite  with inferior abnormality. Prior history of LAD stent. Admitted with  prolonged chest pain without significant change with nitroglycerin.   PROCEDURE:  1. Left heart catheterization.  2. Selective coronary angiography.  3. Left ventriculography.   DESCRIPTION OF PROCEDURE:  After informed consent, a 6 French sheath was  placed in the right femoral artery using the modified Seldinger technique.  1% Xylocaine was used for local anesthesia. A 6 French A2 multipurpose  catheter was used for hemodynamic recordings. Left ventriculography by hand  injection with selective left and right coronary angiography. The patient  tolerated the procedure without complications.   RESULTS:   HEMODYNAMIC DATA:  1. Aortic pressure 123/71.  2. Left ventricular pressure 123/15.   LEFT VENTRICULOGRAPHY:  The left ventricle was normal in contractility. No  regional wall motion abnormalities were noted. EF greater than 60%.   ANGIOGRAPHIC DATA:  1. Left main coronary artery.  Normal.  2. Left anterior descending coronary artery.  The LAD is large and wraps     around the left ventricular apex. There is 20% narrowing of the proximal     third of the LAD stent. The stent, however, is widely patent without any     evidence of significant obstruction.  The small diagonal which arises     proximal to the stent contains ostial 30 to 40% narrowing.  LAD wraps the     apex.  3. Circumflex artery.  Widely patent. Giving origin to four obtuse  marginal     branches. No significant obstruction is noted.  4. Right coronary artery.  The right coronary artery contains proximal and     mid luminal irregularities. No significant obstruction.   CONCLUSION:  1. Widely patent coronaries.  2.     No evidence of left anterior descending restenosis.  3. Normal left ventricular function.   PLAN:  No further cardiac evaluation.  Convert her to outpatient status and  discharge later this afternoon.                                               Lesleigh Noe, M.D.    HWS/MEDQ  D:  04/21/2002  T:  04/21/2002  Job:  [id number]   cc:   Meredith Staggers, M.D.  [address], [address]  [place]  [place] [address]  Fax: [phone]

## 2010-08-26 NOTE — Discharge Summary (Signed)
NAME:  Mark [name], Mark [name] NO.:  [id number]   MEDICAL RECORD NO.:  [id number]          PATIENT TYPE:  INP   LOCATION:  3709                         FACILITY:  [facility]   PHYSICIAN:  Armanda Magic, M.D.     DATE OF BIRTH:  09-19-1940   DATE OF ADMISSION:  10/08/2006  DATE OF DISCHARGE:  10/10/2006                               DISCHARGE SUMMARY   Mark Franco, PA dictating for Armanda Magic, MD.   DISCHARGE DIAGNOSES:  1. Atypical chest pain, nonischemic nuclear study.  2. Coronary artery disease, history of percutaneous intervention to      mid-left anterior descending in 2002.  3. Hypokalemia, repleted.  4. Dyslipidemia, treated.   HOSPITAL COURSE:  Mark [name] is a 70 year old male patient with a known  history of coronary artery disease who underwent a percutaneous  intervention of his mid LAD in 2002.  His other cardiac risk factors  including age, sex, hypertension, and dyslipidemia.  He was admitted on  10/08/2006, with substernal chest pain.  Studies during the patient's  hospitalization include white count of 6.8, hemoglobin 13.2, hematocrit  38, platelets 285.  Sodium 137.  Potassium 3.1, repleted.  BUN 21,  creatinine 1.82.  LFTs normal.  Cardiographs/enzymes negative.  TSH  2.179.  EKG sinus bradycardia rate of 51 with no acute ST-T wave  changes.   The patient underwent a Cardiolite that was felt to be nonischemic in  nature and on 10/10/2006 was ready for discharge to home.   DISCHARGE MEDICATIONS:  Include coated aspirin 325 mg a day, Diovan 160  mg a day, sublingual nitroglycerin p.r.n. chest pain, Fibercon as per  admission, hydrochlorothiazide 25 mg a day, potassium 10 mEq two tablets  daily, fluoxetine 40 mg daily, Omega 3 daily, resume 80 mg Simcor daily  as prior to admission, metoprolol ER 25 mg a day, Nexium 40 mg one  tablet daily.   DISCHARGE INSTRUCTIONS:  Increase activity slowly.  Remain on a low-  sodium Heart Healthy Diet.  Follow up with  laboratory studies at Dr.  Norris Cross office at 10/15/2006 at 9 a.m. then follow up with Dr. Brennan Bailey  on 10/25/2006 at 12:45 p.m.      Armanda Magic, M.D.  Electronically Signed     TT/MEDQ  D:  11/09/2006  T:  11/10/2006  Job:  [id number]

## 2010-08-26 NOTE — H&P (Signed)
[place]. Mark [name]  Patient:    Mark [name], Mark [name] Visit Number: [id number] MRN: [id number]          Service Type: SUR Location: 3000 3014 01 Attending Physician:  Coletta Memos Dictated by:   Mena Goes. Franky Macho, M.D. Admit Date:  07/24/2001 Discharge Date: 07/25/2001                           History and Physical  ADMITTING DIAGNOSIS: Recurrent displaced disk, L4-5 left, and L5 radiculopathy.  HISTORY OF PRESENT ILLNESS: Mark [name] is a 70 year old gentleman, who presented to the office in March 2003 with pain in the left buttock.  I had operated on Mark [name] previously for displaced disk at L4-5.  Since the pain was similar to what he had been describing we ordered an MRI of the lumbar spine.  That MRI showed that he had a recurrent disk at L4-5.  More importantly, he had a normal neurologic examination at that time in March 2003.  He received his first epidural injection July 10, 2001 and after that he noticed that the left foot did not work as well and that he did not have full dorsiflexion.  He called my office on July 22, 2001 and I saw him yesterday for evaluation.  He indeed had weakness in the left EHL and dorsiflexors of the left foot.  That being the case, we put him on the operating room schedule today to prevent any further deterioration in the L5 innervated muscles and hopefully to preserve and possibly improve the flexion that he has now.  He did not have a complete foot-drop.  PAST MEDICAL HISTORY:  1. Hypertension.  2. Coronary artery disease.     a. Percutaneous angioplasty with stenting done this summer.  PAST SURGICAL HISTORY:  1. L4-5 laminectomy and diskectomy in August 1999.  2. Cardiac catheterization September 25, 2000.  3. Angioplasty and stenting September 2002.  REVIEW OF SYSTEMS: He denies pulmonary, hematologic, endocrinologic, gastrointestinal, genitourinary, musculoskeletal, ear/nose/throat, reproductive, and  dermatologic problems.  CURRENT MEDICATIONS:  1. Zocor.  2. Bextra.  3. Nitroglycerin.  4. Xanax.  5. Roxiam.  6. Aspirin.  7. Darvocet.  8. Glucosamine.  9. Multivitamin. 10. FiberCon. 11. Vitamin E. 12. Norvasc. 13. Hydrochlorothiazide. 14. Alprazolam.  SOCIAL HISTORY: He says he does drink rarely.  Does not use any illicit drugs. Does not smoke.  He is married and lives with his wife.  ALLERGIES: No known drug allergies.  PHYSICAL EXAMINATION:  VITAL SIGNS: Temperature 97.3 degrees, pulse 79, respiratory rate 16, blood pressure 123/79.  Height 5 feet 9 inches.  Weight 190 pounds.  NEUROLOGIC: He is alert and oriented x4, answers all questions appropriately. Memory, language, attention span, and fund of knowledge are normal.  PERRL. EOMI.  Full visual fields.  Symmetric facial movements and symmetric facial sensation.  Hearing intact to finger rub bilaterally.  Uvula elevates in the midline.  Shoulder shrug normal.  Tongue protrudes in the midline.  Strength 5/5 upper and right lower extremity weakness approximately 4- to 3/5 in the left extensor hallucis longus and left dorsiflexors.  He cannot heel walk on the left side.  BACK: Well-healed scar in lumbar region.  NECK: No cervical masses or bruits.  CHEST: Lung fields clear.  HEART: Regular rhythm and rate.  No murmurs or rubs.  EXTREMITIES: Pulses good at the wrists and feet.  LABORATORY DATA: MRI shows recurrent disk herniation at L4-5 on the left  side compromising the left L4 and L5 nerve roots.  PLAN: Mark [name] is admitted today for redo laminectomy at L4-5.  Risks of the procedure including bleeding, infection, no pain relief, bowel or bladder dysfunction, and need for further surgery were discussed.  He understands and wishes to proceed. Dictated by:   Mena Goes. Franky Macho, M.D. Attending Physician:  Coletta Memos DD:  07/24/01 TD:  07/25/01 Job: [id number]

## 2010-08-26 NOTE — Op Note (Signed)
NAME:  Mark [name], Mark [name] NO.:  [id number]   MEDICAL RECORD NO.:  [id number]          PATIENT TYPE:  OIB   LOCATION:  3014                         FACILITY:  [facility]   PHYSICIAN:  Danae Orleans. Venetia Maxon, M.D.  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   PROCEDURE:  Right L3-4 microdiskectomy with microdissection.   SURGEON:  Danae Orleans. Venetia Maxon, M.D.   ANESTHESIA:  General endotracheal.   ESTIMATED BLOOD LOSS:  50 cc.   COMPLICATIONS:  None.   DISPOSITION:  To recovery.   INDICATIONS:  Mark [name] is a 70 year old man who is an established  patient of Dr. Franky Macho.  He has previously undergone L4-5 fusion.  He now  has developed a herniated disk at the L3-4 level, which is somewhat  degenerated but not grossly unstable but has a free fragment of herniated  disk material directly beneath the L4 nerve root on the right.  We have  elected to take him to surgery for right L3-4 microdiskectomy, as the  patient is not currently complaining of back pain, it was not felt it would  be necessary to extend the fusion cephalad.   PROCEDURE:  Mark [name] was brought to the operating room.  Following  satisfactory and uncomplicated induction of general endotracheal anesthesia  and placement of intravenous lines, the patient was placed in a prone  position on the Wilson frame.  His low back was then shaved, prepped and  draped in the usual sterile fashion.  The area of plain incision was  infiltrated with 0.25% Marcaine __________ lidocaine and 1:100,000  epinephrine.  The incision was made through  the previous incision overlying  what was felt to be L3-4 interspace.  This was carried through to the dorsal  lumbar fascia on the right side of the midline.  Subperiosteal dissection  was performed.  The L3-4 interspace was identified.  The L4 pedicle screw on  the right was identified.  A marker probe was placed along the self-  retaining retractor, and this confirmed the correct level.  Subsequently, a  hemilaminectomy of L3 was performed.  Care was taken not to get into the  lateral aspect of the lamina and facet-joint complex.  There was a  significant amount of arthritic degenerative changes at this interspace  along with scar tissue.  After laminotomy was performed with a high speed  drill, this was completed with a Kerrison rongeur.  The superior aspect of  the L4 lamina was then removed with a Kerrison rongeur, and this resulted in  exposure of the thecal sac and L4 nerve root.  This was done under loop  magnification.  Subsequently, the loop microscope was brought into the  field, and after the ligamentum flavum was  detached and removed in a  piecemeal fashion, the L4 nerve root was identified and mobilized.  Just  beneath the never root as it extended at the neural foramen, five fragments  of herniated disk material were removed with the resultant significant  decompression of the right L4 nerve root.  The interspace was inspected.  There was a small annular rent on the inferior aspect of the interspace, and  the disk was bulging, but there did not appear to be significant nerve root  compression, and it was therefore elected not to instrument or further  decompress the interspace.  Micro small ball hooks were used under the nerve  root and the thecal sac to confirm that the thecal sac and nerve roots were  well decompressed.  Hemostasis was then assured with thrombin-soaked  Gelfoam.  The operative site was bathed in fentanyl and Depo-Medrol.  The  self-retaining retractors  were removed, and the lumbodorsal fascia was  closed with 0 Vicryl sutures.  Subcutaneous tissues were reapproximated with  2-0 Vicryl interrupted inverted sutures, and the skin edges were  reapproximated with interrupted 0 Vicryl subcuticular stitch.  The skin was  dressed with Dermabond.  The patient was extubated in the operating room and  taken to the recovery room in a stable, satisfactory condition, having  tolerated his operation well.  Counts were correct at the end of the case.      Danae Orleans. Venetia Maxon, M.D.  Electronically Signed     JDS/MEDQ  D:  01/17/2005  T:  01/17/2005  Job:  [id number]

## 2010-08-26 NOTE — Op Note (Signed)
[facility]  Patient:    Mark [name], Mark [name] Visit Number: [id number] MRN: [id number]          Service Type: MED Location: 3700 [phone] Attending Physician:  Armanda Magic Dictated by:   Darci Needle, M.D. Proc. Date: 12/17/00 Admit Date:  12/15/2000   CC:         Armanda Magic, M.D.  Meredith Staggers, M.D., [facility]   Operative Report  INDICATION FOR PROCEDURE: Significant mid left anterior descending disease documented by coronary angiography by Dr. Mayford Knife earlier this morning. Admission to the hospital with unstable anginal-like symptoms.  PROCEDURE PERFORMED: Percutaneous transluminal coronary angioplasty followed by stent of the mid left anterior descending.  DESCRIPTION OF PROCEDURE: Using the existing 6 French sheath that had been previously placed by Dr. Mayford Knife, we performed angioplasty followed by stenting of the mid LAD.  We used a 6 [place] #4 left Judkins guide catheter, a BMW 190 cm long wire, and a 10 mm long 2.5 mm CrossSail balloon. Pre-dilatation was performed with this balloon followed by stenting using a 8 mm long 2.75 mm diameter balloon. Peak pressure was 10 atmospheres. Two balloon inflations were performed. The stent was strategically placed just distal to the origin of a large diagonal branch and may have slightly jailed the second septal perforator.  No complications occurred during the procedure. TIMI grade flow after the procedure was 3.  He received a double bolus, followed by an infusion of Integrilin and a bolus of IV heparin to start the case.  A total of 4500 units of heparin was administered.  CONCLUSIONS: Successful percutaneous transluminal coronary angioplasty and stent of the left anterior descending with reduction in 85% stenosis to 0% stenosis following successful stenting.  PLAN: Plavix for three weeks. Aspirin for lifetime. Integrilin x18 hours. Dictated by:   Darci Needle,  M.D. Attending Physician:  Armanda Magic DD:  12/17/00 TD:  12/17/00 Job: [id number]

## 2010-09-26 ENCOUNTER — Other Ambulatory Visit: Payer: Self-pay | Admitting: Orthopedic Surgery

## 2010-09-26 DIAGNOSIS — M25572 Pain in left ankle and joints of left foot: Secondary | ICD-10-CM

## 2010-09-27 ENCOUNTER — Ambulatory Visit
Admission: RE | Admit: 2010-09-27 | Discharge: 2010-09-27 | Disposition: A | Discharge status: Home / Self Care | Payer: Worker's Compensation | Source: Ambulatory Visit | Attending: Orthopedic Surgery | Admitting: Orthopedic Surgery

## 2010-09-27 DIAGNOSIS — M25572 Pain in left ankle and joints of left foot: Secondary | ICD-10-CM

## 2010-11-09 DIAGNOSIS — G587 Mononeuritis multiplex: Secondary | ICD-10-CM

## 2010-11-09 HISTORY — DX: Mononeuritis multiplex: G58.7

## 2010-12-09 ENCOUNTER — Other Ambulatory Visit: Payer: Self-pay | Admitting: Family Medicine

## 2010-12-09 DIAGNOSIS — M5416 Radiculopathy, lumbar region: Secondary | ICD-10-CM

## 2010-12-15 ENCOUNTER — Ambulatory Visit
Admission: RE | Admit: 2010-12-15 | Discharge: 2010-12-15 | Disposition: A | Discharge status: Home / Self Care | Payer: Self-pay | Source: Ambulatory Visit | Attending: Family Medicine | Admitting: Family Medicine

## 2010-12-15 DIAGNOSIS — M5416 Radiculopathy, lumbar region: Secondary | ICD-10-CM

## 2010-12-15 MED ORDER — GADOBENATE DIMEGLUMINE 529 MG/ML IV SOLN
20.0000 mL | Freq: Once | INTRAVENOUS | Status: AC | PRN
  Administered 2010-12-15: 20 mL via INTRAVENOUS

## 2010-12-20 ENCOUNTER — Other Ambulatory Visit (HOSPITAL_COMMUNITY): Payer: Self-pay | Admitting: Neurosurgery

## 2010-12-20 DIAGNOSIS — M21371 Foot drop, right foot: Secondary | ICD-10-CM

## 2010-12-21 ENCOUNTER — Ambulatory Visit (HOSPITAL_COMMUNITY)
Admission: RE | Admit: 2010-12-21 | Discharge: 2010-12-21 | Disposition: A | Discharge status: Home / Self Care | Payer: Medicare Other | Source: Ambulatory Visit | Attending: Neurosurgery | Admitting: Neurosurgery

## 2010-12-21 DIAGNOSIS — M5137 Other intervertebral disc degeneration, lumbosacral region: Secondary | ICD-10-CM | POA: Insufficient documentation

## 2010-12-21 DIAGNOSIS — M216X9 Other acquired deformities of unspecified foot: Secondary | ICD-10-CM | POA: Insufficient documentation

## 2010-12-21 DIAGNOSIS — M48061 Spinal stenosis, lumbar region without neurogenic claudication: Secondary | ICD-10-CM | POA: Insufficient documentation

## 2010-12-21 DIAGNOSIS — M51379 Other intervertebral disc degeneration, lumbosacral region without mention of lumbar back pain or lower extremity pain: Secondary | ICD-10-CM | POA: Insufficient documentation

## 2010-12-21 DIAGNOSIS — M21371 Foot drop, right foot: Secondary | ICD-10-CM

## 2010-12-21 MED ORDER — IOHEXOL 180 MG/ML  SOLN
20.0000 mL | Freq: Once | INTRAMUSCULAR | Status: AC | PRN
  Administered 2010-12-21: 20 mL via INTRATHECAL

## 2010-12-22 ENCOUNTER — Encounter: Payer: Self-pay | Admitting: Gastroenterology

## 2011-01-24 LAB — BASIC METABOLIC PANEL
BUN: 21
CO2: 28
Calcium: 8.4
Chloride: 103
Creatinine, Ser: 1.02
GFR calc Af Amer: >60
GFR calc non Af Amer: >60
Glucose, Bld: 110 — ABNORMAL HIGH
Potassium: 3.1 — ABNORMAL LOW
Sodium: 137

## 2011-01-24 LAB — CK TOTAL AND CKMB (NOT AT ARMC)
CK, MB: 3.4
Relative Index: 2.3
Relative Index: 2.4
Total CK: 144

## 2011-01-24 LAB — CBC
HCT: 38 — ABNORMAL LOW
Hemoglobin: 13.2
MCV: 87.4
WBC: 6.8

## 2011-01-24 LAB — DIFFERENTIAL
Lymphocytes Relative: 24
Lymphs Abs: 1.6
Monocytes Relative: 7
Neutrophils Relative %: 66

## 2011-01-24 LAB — TROPONIN I
Troponin I: 0.01
Troponin I: <0.01

## 2011-01-25 ENCOUNTER — Ambulatory Visit: Payer: Medicare Other | Attending: Neurology

## 2011-01-25 DIAGNOSIS — M2550 Pain in unspecified joint: Secondary | ICD-10-CM | POA: Insufficient documentation

## 2011-01-25 DIAGNOSIS — IMO0001 Reserved for inherently not codable concepts without codable children: Secondary | ICD-10-CM | POA: Insufficient documentation

## 2011-01-25 DIAGNOSIS — R262 Difficulty in walking, not elsewhere classified: Secondary | ICD-10-CM | POA: Insufficient documentation

## 2011-01-25 DIAGNOSIS — M6281 Muscle weakness (generalized): Secondary | ICD-10-CM | POA: Insufficient documentation

## 2011-01-25 LAB — COMPREHENSIVE METABOLIC PANEL
BUN: 22
CO2: 28
Chloride: 105
Creatinine, Ser: 1.03
GFR calc non Af Amer: >60
Total Bilirubin: 1

## 2011-01-25 LAB — PROTIME-INR
INR: 1.1
Prothrombin Time: 14.1

## 2011-01-25 LAB — I-STAT 8, (EC8 V) (CONVERTED LAB)
Acid-Base Excess: 5 — ABNORMAL HIGH
Chloride: 103
Glucose, Bld: 100 — ABNORMAL HIGH
Hemoglobin: 13.6
Potassium: 3.7
Sodium: 140
TCO2: 31

## 2011-01-25 LAB — CK TOTAL AND CKMB (NOT AT ARMC): Relative Index: 2

## 2011-01-25 LAB — CBC
HCT: 39.6
Platelets: 269
RDW: 13.9
WBC: 7.1

## 2011-01-25 LAB — DIFFERENTIAL
Basophils Absolute: 0
Eosinophils Relative: 2
Lymphocytes Relative: 27
Neutro Abs: 4.4
Neutrophils Relative %: 63

## 2011-01-25 LAB — TROPONIN I: Troponin I: 0.01

## 2011-01-25 LAB — POCT CARDIAC MARKERS: Operator id: 277751

## 2011-01-25 LAB — POCT I-STAT CREATININE: Operator id: 277751

## 2011-01-25 LAB — TSH: TSH: 2.179

## 2011-02-01 ENCOUNTER — Ambulatory Visit: Payer: Medicare Other | Admitting: Physical Therapy

## 2011-02-03 ENCOUNTER — Ambulatory Visit: Payer: Medicare Other | Admitting: Physical Therapy

## 2011-02-07 ENCOUNTER — Ambulatory Visit: Payer: Medicare Other | Admitting: Physical Therapy

## 2011-02-09 ENCOUNTER — Ambulatory Visit: Payer: Medicare Other | Attending: Neurology | Admitting: Physical Therapy

## 2011-02-09 DIAGNOSIS — M2550 Pain in unspecified joint: Secondary | ICD-10-CM | POA: Insufficient documentation

## 2011-02-09 DIAGNOSIS — R262 Difficulty in walking, not elsewhere classified: Secondary | ICD-10-CM | POA: Insufficient documentation

## 2011-02-09 DIAGNOSIS — M6281 Muscle weakness (generalized): Secondary | ICD-10-CM | POA: Insufficient documentation

## 2011-02-09 DIAGNOSIS — IMO0001 Reserved for inherently not codable concepts without codable children: Secondary | ICD-10-CM | POA: Insufficient documentation

## 2011-02-14 ENCOUNTER — Ambulatory Visit: Payer: Medicare Other | Admitting: Physical Therapy

## 2011-02-16 ENCOUNTER — Ambulatory Visit: Payer: Medicare Other | Admitting: Physical Therapy

## 2011-02-20 ENCOUNTER — Ambulatory Visit: Payer: Medicare Other | Admitting: Physical Therapy

## 2011-02-22 ENCOUNTER — Ambulatory Visit: Payer: Medicare Other | Admitting: Physical Therapy

## 2011-02-22 ENCOUNTER — Ambulatory Visit: Payer: Medicare Other | Admitting: *Deleted

## 2011-02-27 ENCOUNTER — Ambulatory Visit: Payer: BC Managed Care – PPO | Admitting: Physical Therapy

## 2011-02-28 ENCOUNTER — Ambulatory Visit: Payer: Medicare Other | Admitting: Physical Therapy

## 2011-03-01 ENCOUNTER — Ambulatory Visit: Payer: Medicare Other | Admitting: Physical Therapy

## 2011-03-07 ENCOUNTER — Ambulatory Visit: Payer: Medicare Other | Admitting: Physical Therapy

## 2011-03-09 ENCOUNTER — Ambulatory Visit: Payer: Medicare Other | Admitting: Physical Therapy

## 2011-03-13 ENCOUNTER — Ambulatory Visit: Payer: Medicare Other | Attending: Neurology | Admitting: Physical Therapy

## 2011-03-13 DIAGNOSIS — M6281 Muscle weakness (generalized): Secondary | ICD-10-CM | POA: Insufficient documentation

## 2011-03-13 DIAGNOSIS — M2550 Pain in unspecified joint: Secondary | ICD-10-CM | POA: Insufficient documentation

## 2011-03-13 DIAGNOSIS — IMO0001 Reserved for inherently not codable concepts without codable children: Secondary | ICD-10-CM | POA: Insufficient documentation

## 2011-03-13 DIAGNOSIS — R262 Difficulty in walking, not elsewhere classified: Secondary | ICD-10-CM | POA: Insufficient documentation

## 2011-03-15 ENCOUNTER — Ambulatory Visit: Payer: Medicare Other | Admitting: Physical Therapy

## 2011-03-22 ENCOUNTER — Ambulatory Visit: Payer: BC Managed Care – PPO | Admitting: Physical Therapy

## 2011-03-24 ENCOUNTER — Ambulatory Visit: Payer: BC Managed Care – PPO | Admitting: Physical Therapy

## 2011-04-19 ENCOUNTER — Encounter: Payer: Self-pay | Admitting: Gastroenterology

## 2011-05-22 ENCOUNTER — Ambulatory Visit (AMBULATORY_SURGERY_CENTER): Payer: Medicare Other | Admitting: *Deleted

## 2011-05-22 ENCOUNTER — Encounter: Payer: BC Managed Care – PPO | Admitting: Gastroenterology

## 2011-05-22 VITALS — Ht 69.0 in | Wt 215.0 lb

## 2011-05-22 DIAGNOSIS — Z1211 Encounter for screening for malignant neoplasm of colon: Secondary | ICD-10-CM

## 2011-05-22 MED ORDER — PEG-KCL-NACL-NASULF-NA ASC-C 100 G PO SOLR: ORAL | Status: DC

## 2011-05-29 DIAGNOSIS — G5700 Lesion of sciatic nerve, unspecified lower limb: Secondary | ICD-10-CM | POA: Diagnosis not present

## 2011-05-29 DIAGNOSIS — E785 Hyperlipidemia, unspecified: Secondary | ICD-10-CM | POA: Diagnosis not present

## 2011-05-29 DIAGNOSIS — I10 Essential (primary) hypertension: Secondary | ICD-10-CM | POA: Diagnosis not present

## 2011-05-29 DIAGNOSIS — I2510 Atherosclerotic heart disease of native coronary artery without angina pectoris: Secondary | ICD-10-CM | POA: Diagnosis not present

## 2011-05-29 DIAGNOSIS — M5137 Other intervertebral disc degeneration, lumbosacral region: Secondary | ICD-10-CM | POA: Diagnosis not present

## 2011-06-05 DIAGNOSIS — E7800 Pure hypercholesterolemia, unspecified: Secondary | ICD-10-CM | POA: Diagnosis not present

## 2011-06-05 DIAGNOSIS — Z79899 Other long term (current) drug therapy: Secondary | ICD-10-CM | POA: Diagnosis not present

## 2011-06-06 ENCOUNTER — Other Ambulatory Visit: Payer: Self-pay | Admitting: Gastroenterology

## 2011-06-06 ENCOUNTER — Encounter: Payer: Self-pay | Admitting: Gastroenterology

## 2011-06-06 ENCOUNTER — Ambulatory Visit (AMBULATORY_SURGERY_CENTER): Payer: Medicare Other | Admitting: Gastroenterology

## 2011-06-06 DIAGNOSIS — D126 Benign neoplasm of colon, unspecified: Secondary | ICD-10-CM

## 2011-06-06 DIAGNOSIS — Z1211 Encounter for screening for malignant neoplasm of colon: Secondary | ICD-10-CM

## 2011-06-06 DIAGNOSIS — I10 Essential (primary) hypertension: Secondary | ICD-10-CM | POA: Diagnosis not present

## 2011-06-06 DIAGNOSIS — E785 Hyperlipidemia, unspecified: Secondary | ICD-10-CM | POA: Diagnosis not present

## 2011-06-06 MED ORDER — SODIUM CHLORIDE 0.9 % IV SOLN: 500.0000 mL | INTRAVENOUS | Status: DC

## 2011-06-06 NOTE — Op Note (Signed)
[facility] [address] [place], [place]  [address]  COLONOSCOPY PROCEDURE REPORT  PATIENT:  Mark [name], Mark [name]  MR#:  [id number] BIRTHDATE:  28-Mar-1941, 71 yrs. old  GENDER:  male ENDOSCOPIST:  Barbette Hair. Arlyce Dice, MD REF. BY: PROCEDURE DATE:  06/06/2011 PROCEDURE:  Colonoscopy with snare polypectomy, Colon with cold biopsy polypectomy ASA CLASS:  Class II INDICATIONS:  Routine Risk Screening MEDICATIONS:   MAC sedation, administered by CRNA propofol 300mg IV  DESCRIPTION OF PROCEDURE:   After the risks benefits and alternatives of the procedure were thoroughly explained, informed consent was obtained.  Digital rectal exam was performed and revealed no abnormalities.   The LB [id number]-[id number] endoscope was introduced through the anus and advanced to the cecum, which was identified by both the appendix and ileocecal valve, without limitations.  The quality of the prep was excellent, using MoviPrep.  The instrument was then slowly withdrawn as the colon was fully examined. <<PROCEDUREIMAGES>>  FINDINGS:  A diminutive polyp was found in the ascending colon. It was 2 mm in size. The polyp was removed using cold biopsy forceps (see image3).  A sessile polyp was found in the sigmoid colon. It was 2 - 3 mm in size. It was found 10 cm from the point of entry. Polyp was snared without cautery. Retrieval was successful (see image6). snare polyp  Scattered diverticula were found in the sigmoid to descending colon segments (see image4).  This was otherwise a normal examination of the colon (see image2 and image7).   Retroflexed views in the rectum revealed no abnormalities.    The time to cecum =  1) 2.25  minutes. The scope was then withdrawn in  1) 10.25  minutes from the cecum and the procedure completed. COMPLICATIONS:  None ENDOSCOPIC IMPRESSION: 1) 2 mm diminutive polyp in the ascending colon 2) 2 - 3 mm sessile polyp in the sigmoid colon 3) Diverticula, scattered in  the sigmoid to descending colon segments 4) Otherwise normal examination RECOMMENDATIONS: 1) If the polyp(s) removed today are proven to be adenomatous (pre-cancerous) polyps, you will need a repeat colonoscopy in 5 years. Otherwise you should continue to follow colorectal cancer screening guidelines for "routine risk" patients with colonoscopy in 10 years. You will receive a letter within 1-2 weeks with the results of your biopsy as well as final recommendations. Please call my office if you have not received a letter after 3 weeks. REPEAT EXAM:  You will receive a letter from Dr. Arlyce Dice in 1-2 weeks, after reviewing the final pathology, with followup recommendations.  ______________________________ Barbette Hair Arlyce Dice, MD  CC:  Johny Blamer MD  n. eSIGNED:   Barbette Hair. Mallie Linnemann at 06/06/2011 12:14 PM  Thana Ates, [phone]

## 2011-06-06 NOTE — Patient Instructions (Addendum)
Impressions/Recommendations:  Polyps (handout given) Diverticulosis (handout given) High Fiber Diet (handout given)  The results of your pathology from your polyps will determine when you will return for a colonoscopy. 5-10 years. You will receive a letter from [place] within 1-2 weeks.  YOU HAD AN ENDOSCOPIC PROCEDURE TODAY AT THE [facility]: Refer to the procedure report that was given to you for any specific questions about what was found during the examination.  If the procedure report does not answer your questions, please call your gastroenterologist to clarify.  If you requested that your care partner not be given the details of your procedure findings, then the procedure report has been included in a sealed envelope for you to review at your convenience later.  YOU SHOULD EXPECT: Some feelings of bloating in the abdomen. Passage of more gas than usual.  Walking can help get rid of the air that was put into your GI tract during the procedure and reduce the bloating. If you had a lower endoscopy (such as a colonoscopy or flexible sigmoidoscopy) you may notice spotting of blood in your stool or on the toilet paper. If you underwent a bowel prep for your procedure, then you may not have a normal bowel movement for a few days.  DIET: Your first meal following the procedure should be a light meal and then it is ok to progress to your normal diet.  A half-sandwich or bowl of soup is an example of a good first meal.  Heavy or fried foods are harder to digest and may make you feel nauseous or bloated.  Likewise meals heavy in dairy and vegetables can cause extra gas to form and this can also increase the bloating.  Drink plenty of fluids but you should avoid alcoholic beverages for 24 hours.  ACTIVITY: Your care partner should take you home directly after the procedure.  You should plan to take it easy, moving slowly for the rest of the day.  You can resume normal activity the day after the  procedure however you should NOT DRIVE or use heavy machinery for 24 hours (because of the sedation medicines used during the test).    SYMPTOMS TO REPORT IMMEDIATELY:  .A gastroenterologist can be reached at any hour.  During normal business hours, 8:30 AM to 5:00 PM Monday through Friday, call [phone].  After hours and on weekends, please call the GI answering service at [phone] who will take a message and have the physician on call contact you.   Following lower endoscopy (colonoscopy or flexible sigmoidoscopy):  Excessive amounts of blood in the stool  Significant tenderness or worsening of abdominal pains  Swelling of the abdomen that is new, acute  Fever of 100F or higher  Following upper endoscopy (EGD)  Vomiting of blood or coffee ground material  New chest pain or pain under the shoulder blades  Painful or persistently difficult swallowing  New shortness of breath  Fever of 100F or higher  Black, tarry-looking stools  FOLLOW UP: If any biopsies were taken you will be contacted by phone or by letter within the next 1-3 weeks.  Call your gastroenterologist if you have not heard about the biopsies in 3 weeks.  Our staff will call the home number listed on your records the next business day following your procedure to check on you and address any questions or concerns that you may have at that time regarding the information given to you following your procedure. This is a Research officer, political party  call and so if there is no answer at the home number and we have not heard from you through the emergency physician on call, we will assume that you have returned to your regular daily activities without incident.  SIGNATURES/CONFIDENTIALITY: You and/or your care partner have signed paperwork which will be entered into your electronic medical record.  These signatures attest to the fact that that the information above on your After Visit Summary has been reviewed and is understood.  Full  responsibility of the confidentiality of this discharge information lies with you and/or your care-partner.

## 2011-06-07 ENCOUNTER — Telehealth: Payer: Self-pay | Admitting: *Deleted

## 2011-06-07 NOTE — Telephone Encounter (Signed)
  Follow up Call-  Call back number 06/06/2011  Post procedure Call Back phone  # [phone]  Permission to leave phone message Yes     Patient questions:  Do you have a fever, pain , or abdominal swelling? no Pain Score  0 *  Have you tolerated food without any problems? yes  Have you been able to return to your normal activities? yes  Do you have any questions about your discharge instructions: Diet   no Medications  no Follow up visit  no  Do you have questions or concerns about your Care? no  Actions: * If pain score is 4 or above: No action needed, pain <4.

## 2011-06-13 ENCOUNTER — Encounter: Payer: Self-pay | Admitting: Gastroenterology

## 2011-06-13 DIAGNOSIS — G544 Lumbosacral root disorders, not elsewhere classified: Secondary | ICD-10-CM | POA: Diagnosis not present

## 2011-06-20 DIAGNOSIS — J309 Allergic rhinitis, unspecified: Secondary | ICD-10-CM | POA: Diagnosis not present

## 2011-07-12 DIAGNOSIS — R05 Cough: Secondary | ICD-10-CM | POA: Diagnosis not present

## 2011-07-12 DIAGNOSIS — R059 Cough, unspecified: Secondary | ICD-10-CM | POA: Diagnosis not present

## 2011-07-12 DIAGNOSIS — J309 Allergic rhinitis, unspecified: Secondary | ICD-10-CM | POA: Diagnosis not present

## 2011-08-31 DIAGNOSIS — H40019 Open angle with borderline findings, low risk, unspecified eye: Secondary | ICD-10-CM | POA: Diagnosis not present

## 2011-09-18 DIAGNOSIS — E7800 Pure hypercholesterolemia, unspecified: Secondary | ICD-10-CM | POA: Diagnosis not present

## 2011-09-18 DIAGNOSIS — G4733 Obstructive sleep apnea (adult) (pediatric): Secondary | ICD-10-CM | POA: Diagnosis not present

## 2011-09-18 DIAGNOSIS — I2510 Atherosclerotic heart disease of native coronary artery without angina pectoris: Secondary | ICD-10-CM | POA: Diagnosis not present

## 2011-09-18 DIAGNOSIS — K117 Disturbances of salivary secretion: Secondary | ICD-10-CM | POA: Diagnosis not present

## 2011-09-18 DIAGNOSIS — I10 Essential (primary) hypertension: Secondary | ICD-10-CM | POA: Diagnosis not present

## 2011-11-01 DIAGNOSIS — R350 Frequency of micturition: Secondary | ICD-10-CM | POA: Diagnosis not present

## 2011-11-01 DIAGNOSIS — L989 Disorder of the skin and subcutaneous tissue, unspecified: Secondary | ICD-10-CM | POA: Diagnosis not present

## 2011-12-07 DIAGNOSIS — R972 Elevated prostate specific antigen [PSA]: Secondary | ICD-10-CM | POA: Diagnosis not present

## 2011-12-12 DIAGNOSIS — E7800 Pure hypercholesterolemia, unspecified: Secondary | ICD-10-CM | POA: Diagnosis not present

## 2011-12-24 DIAGNOSIS — G4710 Hypersomnia, unspecified: Secondary | ICD-10-CM | POA: Diagnosis not present

## 2012-01-29 DIAGNOSIS — I10 Essential (primary) hypertension: Secondary | ICD-10-CM | POA: Diagnosis not present

## 2012-01-29 DIAGNOSIS — Z0000 Encounter for general adult medical examination without abnormal findings: Secondary | ICD-10-CM | POA: Diagnosis not present

## 2012-01-29 DIAGNOSIS — E7800 Pure hypercholesterolemia, unspecified: Secondary | ICD-10-CM | POA: Diagnosis not present

## 2012-01-29 DIAGNOSIS — M79609 Pain in unspecified limb: Secondary | ICD-10-CM | POA: Diagnosis not present

## 2012-03-25 DIAGNOSIS — R972 Elevated prostate specific antigen [PSA]: Secondary | ICD-10-CM | POA: Diagnosis not present

## 2012-04-04 DIAGNOSIS — I10 Essential (primary) hypertension: Secondary | ICD-10-CM | POA: Diagnosis not present

## 2012-04-04 DIAGNOSIS — R5381 Other malaise: Secondary | ICD-10-CM | POA: Diagnosis not present

## 2012-04-04 DIAGNOSIS — I2510 Atherosclerotic heart disease of native coronary artery without angina pectoris: Secondary | ICD-10-CM | POA: Diagnosis not present

## 2012-04-04 DIAGNOSIS — R5383 Other fatigue: Secondary | ICD-10-CM | POA: Diagnosis not present

## 2012-04-04 DIAGNOSIS — E7800 Pure hypercholesterolemia, unspecified: Secondary | ICD-10-CM | POA: Diagnosis not present

## 2012-04-23 DIAGNOSIS — H9190 Unspecified hearing loss, unspecified ear: Secondary | ICD-10-CM | POA: Diagnosis not present

## 2012-04-23 DIAGNOSIS — H04129 Dry eye syndrome of unspecified lacrimal gland: Secondary | ICD-10-CM | POA: Diagnosis not present

## 2012-04-23 DIAGNOSIS — E291 Testicular hypofunction: Secondary | ICD-10-CM | POA: Diagnosis not present

## 2012-05-02 DIAGNOSIS — D2360 Other benign neoplasm of skin of unspecified upper limb, including shoulder: Secondary | ICD-10-CM | POA: Diagnosis not present

## 2012-05-02 DIAGNOSIS — L538 Other specified erythematous conditions: Secondary | ICD-10-CM | POA: Diagnosis not present

## 2012-05-02 DIAGNOSIS — L570 Actinic keratosis: Secondary | ICD-10-CM | POA: Diagnosis not present

## 2012-05-02 DIAGNOSIS — L821 Other seborrheic keratosis: Secondary | ICD-10-CM | POA: Diagnosis not present

## 2012-05-02 DIAGNOSIS — D239 Other benign neoplasm of skin, unspecified: Secondary | ICD-10-CM | POA: Diagnosis not present

## 2012-05-02 DIAGNOSIS — L739 Follicular disorder, unspecified: Secondary | ICD-10-CM | POA: Diagnosis not present

## 2012-05-02 DIAGNOSIS — L819 Disorder of pigmentation, unspecified: Secondary | ICD-10-CM | POA: Diagnosis not present

## 2012-05-16 DIAGNOSIS — J209 Acute bronchitis, unspecified: Secondary | ICD-10-CM | POA: Diagnosis not present

## 2012-05-17 DIAGNOSIS — H903 Sensorineural hearing loss, bilateral: Secondary | ICD-10-CM | POA: Diagnosis not present

## 2012-05-28 DIAGNOSIS — J209 Acute bronchitis, unspecified: Secondary | ICD-10-CM | POA: Diagnosis not present

## 2012-06-06 DIAGNOSIS — G5700 Lesion of sciatic nerve, unspecified lower limb: Secondary | ICD-10-CM | POA: Diagnosis not present

## 2012-06-06 DIAGNOSIS — Z79899 Other long term (current) drug therapy: Secondary | ICD-10-CM | POA: Diagnosis not present

## 2012-06-07 DIAGNOSIS — G544 Lumbosacral root disorders, not elsewhere classified: Secondary | ICD-10-CM | POA: Diagnosis not present

## 2012-06-12 ENCOUNTER — Other Ambulatory Visit: Payer: Self-pay | Admitting: Neurology

## 2012-06-12 DIAGNOSIS — M5137 Other intervertebral disc degeneration, lumbosacral region: Secondary | ICD-10-CM

## 2012-06-13 ENCOUNTER — Ambulatory Visit
Admission: RE | Admit: 2012-06-13 | Discharge: 2012-06-13 | Disposition: A | Discharge status: Home / Self Care | Payer: Medicare Other | Source: Ambulatory Visit | Attending: Neurology | Admitting: Neurology

## 2012-06-13 DIAGNOSIS — M5137 Other intervertebral disc degeneration, lumbosacral region: Secondary | ICD-10-CM

## 2012-06-13 MED ORDER — GADOBENATE DIMEGLUMINE 529 MG/ML IV SOLN
19.0000 mL | Freq: Once | INTRAVENOUS | Status: AC | PRN
  Administered 2012-06-13: 19 mL via INTRAVENOUS

## 2012-06-16 ENCOUNTER — Other Ambulatory Visit: Payer: Medicare Other

## 2012-06-19 DIAGNOSIS — I2510 Atherosclerotic heart disease of native coronary artery without angina pectoris: Secondary | ICD-10-CM | POA: Diagnosis not present

## 2012-06-19 DIAGNOSIS — G5700 Lesion of sciatic nerve, unspecified lower limb: Secondary | ICD-10-CM | POA: Diagnosis not present

## 2012-07-02 DIAGNOSIS — E291 Testicular hypofunction: Secondary | ICD-10-CM | POA: Diagnosis not present

## 2012-08-13 ENCOUNTER — Observation Stay (HOSPITAL_COMMUNITY)
Admission: EM | Admit: 2012-08-13 | Discharge: 2012-08-14 | Disposition: A | Discharge status: Home / Self Care | Payer: Medicare Other | Attending: Cardiology | Admitting: Cardiology

## 2012-08-13 ENCOUNTER — Emergency Department (HOSPITAL_COMMUNITY): Payer: Medicare Other

## 2012-08-13 ENCOUNTER — Encounter (HOSPITAL_COMMUNITY): Payer: Self-pay | Admitting: Emergency Medicine

## 2012-08-13 DIAGNOSIS — F32A Depression, unspecified: Secondary | ICD-10-CM | POA: Diagnosis not present

## 2012-08-13 DIAGNOSIS — Z981 Arthrodesis status: Secondary | ICD-10-CM | POA: Insufficient documentation

## 2012-08-13 DIAGNOSIS — I2510 Atherosclerotic heart disease of native coronary artery without angina pectoris: Secondary | ICD-10-CM | POA: Insufficient documentation

## 2012-08-13 DIAGNOSIS — R079 Chest pain, unspecified: Secondary | ICD-10-CM | POA: Diagnosis not present

## 2012-08-13 DIAGNOSIS — E876 Hypokalemia: Secondary | ICD-10-CM | POA: Insufficient documentation

## 2012-08-13 DIAGNOSIS — E785 Hyperlipidemia, unspecified: Secondary | ICD-10-CM | POA: Diagnosis present

## 2012-08-13 DIAGNOSIS — Z79899 Other long term (current) drug therapy: Secondary | ICD-10-CM | POA: Insufficient documentation

## 2012-08-13 DIAGNOSIS — Z9861 Coronary angioplasty status: Secondary | ICD-10-CM | POA: Diagnosis not present

## 2012-08-13 DIAGNOSIS — G4733 Obstructive sleep apnea (adult) (pediatric): Secondary | ICD-10-CM | POA: Diagnosis present

## 2012-08-13 DIAGNOSIS — I10 Essential (primary) hypertension: Secondary | ICD-10-CM | POA: Diagnosis not present

## 2012-08-13 DIAGNOSIS — R072 Precordial pain: Secondary | ICD-10-CM | POA: Diagnosis not present

## 2012-08-13 DIAGNOSIS — I200 Unstable angina: Secondary | ICD-10-CM | POA: Diagnosis not present

## 2012-08-13 DIAGNOSIS — K219 Gastro-esophageal reflux disease without esophagitis: Secondary | ICD-10-CM | POA: Diagnosis not present

## 2012-08-13 DIAGNOSIS — F329 Major depressive disorder, single episode, unspecified: Secondary | ICD-10-CM | POA: Diagnosis not present

## 2012-08-13 HISTORY — DX: Depression, unspecified: F32.A

## 2012-08-13 HISTORY — DX: Gastro-esophageal reflux disease without esophagitis: K21.9

## 2012-08-13 HISTORY — DX: Major depressive disorder, single episode, unspecified: F32.9

## 2012-08-13 HISTORY — DX: Sleep apnea, unspecified: G47.30

## 2012-08-13 HISTORY — DX: Sciatica, unspecified side: M54.30

## 2012-08-13 LAB — BASIC METABOLIC PANEL
BUN: 16 mg/dL (ref 6–23)
Creatinine, Ser: 0.92 mg/dL (ref 0.50–1.35)
GFR calc Af Amer: >90 mL/min (ref >90)
GFR calc non Af Amer: 83 mL/min — ABNORMAL LOW (ref >90)

## 2012-08-13 LAB — CBC
HCT: 37.3 % — ABNORMAL LOW (ref 39.0–52.0)
MCHC: 35.7 g/dL (ref 30.0–36.0)
MCV: 81.6 fL (ref 78.0–100.0)
RDW: 13.3 % (ref 11.5–15.5)

## 2012-08-13 MED ORDER — NITROGLYCERIN 0.4 MG SL SUBL: 0.4000 mg | SUBLINGUAL_TABLET | SUBLINGUAL | Status: DC | PRN

## 2012-08-13 MED ORDER — NITROGLYCERIN 2 % TD OINT
1.0000 [in_us] | TOPICAL_OINTMENT | Freq: Once | TRANSDERMAL | Status: AC
  Administered 2012-08-13: 1 [in_us] via TOPICAL
  Filled 2012-08-13: qty 1

## 2012-08-13 MED ORDER — ASPIRIN 325 MG PO TABS: 325.0000 mg | ORAL_TABLET | Freq: Once | ORAL | Status: DC

## 2012-08-13 MED ORDER — MORPHINE SULFATE 4 MG/ML IJ SOLN
4.0000 mg | Freq: Once | INTRAMUSCULAR | Status: AC
  Administered 2012-08-13: 4 mg via INTRAVENOUS
  Filled 2012-08-13: qty 1

## 2012-08-13 MED ORDER — NITROGLYCERIN 2 % TD OINT: 1.0000 [in_us] | TOPICAL_OINTMENT | Freq: Four times a day (QID) | TRANSDERMAL | Status: DC

## 2012-08-13 MED ORDER — SODIUM CHLORIDE 0.9 % IV BOLUS (SEPSIS)
500.0000 mL | Freq: Once | INTRAVENOUS | Status: AC
  Administered 2012-08-13: 500 mL via INTRAVENOUS

## 2012-08-13 MED ORDER — ASPIRIN 81 MG PO CHEW
324.0000 mg | CHEWABLE_TABLET | Freq: Once | ORAL | Status: AC
  Administered 2012-08-13: 324 mg via ORAL
  Filled 2012-08-13: qty 4

## 2012-08-13 MED ORDER — HEPARIN BOLUS VIA INFUSION
4000.0000 [IU] | Freq: Once | INTRAVENOUS | Status: AC
  Administered 2012-08-13: 4000 [IU] via INTRAVENOUS

## 2012-08-13 MED ORDER — HEPARIN BOLUS VIA INFUSION: 5000.0000 [IU] | Freq: Once | INTRAVENOUS | Status: DC

## 2012-08-13 MED ORDER — HEPARIN (PORCINE) IN NACL 100-0.45 UNIT/ML-% IJ SOLN: 12.0000 [IU]/kg/h | INTRAMUSCULAR | Status: DC

## 2012-08-13 MED ORDER — POTASSIUM CHLORIDE CRYS ER 20 MEQ PO TBCR
20.0000 meq | EXTENDED_RELEASE_TABLET | Freq: Once | ORAL | Status: AC
  Administered 2012-08-13: 20 meq via ORAL
  Filled 2012-08-13: qty 1

## 2012-08-13 MED ORDER — HEPARIN (PORCINE) IN NACL 100-0.45 UNIT/ML-% IJ SOLN
1300.0000 [IU]/h | INTRAMUSCULAR | Status: DC
  Administered 2012-08-13: 1000 [IU]/h via INTRAVENOUS
  Filled 2012-08-13: qty 250

## 2012-08-13 MED ORDER — MORPHINE SULFATE 2 MG/ML IJ SOLN
2.0000 mg | INTRAMUSCULAR | Status: DC | PRN
  Administered 2012-08-13 – 2012-08-14 (×3): 2 mg via INTRAVENOUS
  Filled 2012-08-13 (×3): qty 1

## 2012-08-13 NOTE — ED Provider Notes (Signed)
History     CSN: [id number]  Arrival date & time 08/13/12  1554   First MD Initiated Contact with Patient 08/13/12 1640      Chief Complaint  Patient presents with  . Chest Pain    (Consider location/radiation/quality/duration/timing/severity/associated sxs/prior treatment) HPI Comments:  72 Y M with PMH of HTN, HLD and CAD s/p MI with LAD stenting in 2002 on daily ASA here for CP, non-radiating, described as pressure, similar pain to prior MI, onset while sitting, no diaphoresis/nausea/vomiting/cough/rash/fevers.  6/10 at onset.  3/10 currently after NTG x4.  No provocative testing per pt in 5-6 years.   Patient is a 72 y.o. male presenting with chest pain. The history is provided by the patient.  Chest Pain Pain location:  Substernal area and L chest Pain quality: pressure   Pain radiates to:  Does not radiate Pain radiates to the back: no   Pain severity:  Moderate Onset quality:  Sudden Timing:  Constant Progression:  Improving Chronicity:  New Context: at rest   Context: not breathing, not eating, no movement and not raising an arm   Relieved by:  Nitroglycerin (partially) Associated symptoms: no abdominal pain, no cough, no dysphagia, no fever and no shortness of breath   Risk factors: coronary artery disease (s/p stent to LAD after MI in 2002), high cholesterol, hypertension and smoking (former, quit in 80s)   Risk factors: no diabetes mellitus and no prior DVT/PE     Past Medical History  Diagnosis Date  . Environmental allergies   . Hypertension   . Hyperlipidemia   . 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 CAD (coronary artery disease) 2002    stent placement  . Macular degeneration     Past Surgical History  Procedure Laterality Date  . Angioplasty  2002    stent placement  . Elbow surgery  1998    left  . Back surgery  1998, 2002, 2005    lumbar fusion with hardware    Family History  Problem Relation Age of Onset  . Colon cancer Neg Hx    . Stomach cancer Neg Hx   . Heart disease Brother   . Lung cancer Brother     History  Substance Use Topics  . Smoking status: Former Smoker    Types: Cigarettes    Quit date: 05/21/1978  . Smokeless tobacco: Never Used  . Alcohol Use: 0.6 oz/week    1 Glasses of wine per week     Comment: occasional      Review of Systems  Constitutional: Negative for fever.  HENT: Negative for trouble swallowing.   Respiratory: Negative for cough and shortness of breath.   Cardiovascular: Positive for chest pain.  Gastrointestinal: Negative for abdominal pain.  All other systems reviewed and are negative.    Allergies  Percocet  Home Medications   Current Outpatient Rx  Name  Route  Sig  Dispense  Refill  . aspirin 325 MG tablet   Oral   Take 325 mg by mouth daily.         . calcium citrate-vitamin D 200-200 MG-UNIT TABS   Oral   Take 1 tablet by mouth daily.         . cholecalciferol (VITAMIN D) 1000 UNITS tablet   Oral   Take 1,000 Units by mouth daily.         Marland Kitchen docusate sodium (COLACE) 100 MG capsule   Oral   Take 100 mg by  mouth 2 (two) times daily.         . hydrochlorothiazide (HYDRODIURIL) 25 MG tablet   Oral   Take 25 mg by mouth daily.         . Multiple Vitamins-Minerals (ICAPS PO)   Oral   Take 2 tablets by mouth daily.         . Niacin-Simvastatin [facility]) 1000-40 MG TB24   Oral   Take 1 tablet by mouth daily.          . Omega-3 Fatty Acids (FISH OIL) 1200 MG CAPS   Oral   Take 2 capsules by mouth 2 (two) times daily.         . polycarbophil (FIBERCON) 625 MG tablet   Oral   Take 625 mg by mouth daily. Takes 2 tablets daily         . potassium chloride SA (K-DUR,KLOR-CON) 20 MEQ tablet   Oral   Take 20 mEq by mouth daily.         . pregabalin (LYRICA) 50 MG capsule   Oral   Take 50 mg by mouth 2 (two) times daily.         . valsartan (DIOVAN) 160 MG tablet   Oral   Take 160 mg by mouth daily.           BP  99/65  Pulse 70  Temp(Src) 97.4 F (36.3 C) (Oral)  Resp 16  SpO2 95%  Physical Exam  Vitals reviewed. Constitutional: He is oriented to person, place, and time. He appears well-developed and well-nourished. No distress.  HENT:  Head: Normocephalic.  Right Ear: External ear normal.  Left Ear: External ear normal.  Nose: Nose normal.  Mouth/Throat: Oropharynx is clear and moist. No oropharyngeal exudate.  Eyes: Conjunctivae and EOM are normal. Pupils are equal, round, and reactive to light.  Neck: Normal range of motion. Neck supple.  Cardiovascular: Normal rate, regular rhythm, normal heart sounds and intact distal pulses.  Exam reveals no gallop and no friction rub.   No murmur heard. Pulmonary/Chest: Effort normal and breath sounds normal. He exhibits no tenderness.  Abdominal: Soft. Bowel sounds are normal. He exhibits no distension. There is no tenderness.  Musculoskeletal: Normal range of motion. He exhibits no edema and no tenderness.  Neurological: He is alert and oriented to person, place, and time. No cranial nerve deficit.  Skin: Skin is warm and dry. He is not diaphoretic.  Psychiatric: He has a normal mood and affect.    ED Course  Procedures (including critical care time)  Labs Reviewed  CBC - Abnormal; Notable for the following:    HCT 37.3 (*)    All other components within normal limits  BASIC METABOLIC PANEL - Abnormal; Notable for the following:    Potassium 3.4 (*)    Glucose, Bld 140 (*)    GFR calc non Af Amer 83 (*)    All other components within normal limits  TROPONIN I  POCT I-STAT TROPONIN I   Dg Chest 2 View  08/13/2012  *RADIOLOGY REPORT*  Clinical Data: Chest pain, hypertension  CHEST - 2 VIEW  Comparison: 05/28/2012  Findings: Normal heart size, mediastinal contours, and pulmonary vascularity. Lungs clear. No pleural effusion or pneumothorax. Multilevel endplate spur formation thoracic spine. Old fracture lateral left seventh rib.  IMPRESSION:  No acute abnormalities.   Original Report Authenticated By: Ulyses Southward, M.D.      1. Unstable angina   2. CAD (coronary artery disease)  3. Chest pain   4. Dyslipidemia   5. Hypertension       MDM   72 Y M with PMH of HTN, HLD and CAD s/p MI with LAD stenting in 2002 on daily ASA here for CP, non-radiating, described as pressure, similar pain to prior MI, onset while sitting, no diaphoresis/nausea/vomiting/cough/rash/fevers.  6/10 at onset.  3/10 currently after NTG x4.  No provocative testing per pt in 5-6 years.  AFVSS, BPs soft.  No abd TTP.  No rash.  Pain not reproducible.  Diff Dx: ACS/unstable angina, MSK, GERD, esophageal spasm, PNA,   EKG and CXR WNLs.  Concern for unstable angina given story.  Heparin bolus/gtt.  324 mg ASA chewable.  NTGx3, 4 mg Morhpine.  Will place paste if able to get CP free.  5:40 PM Cardiology consulted for admission.  Pt reports CP still at 3.  Disposition: Admit  Condition: Stable  Pt seen in conjunction with my attending, Dr. Bebe Shaggy.  Oleh Genin, MD PGY-II [facility] Emergency Medicine Resident      Oleh Genin, MD 08/14/12 [phone]

## 2012-08-13 NOTE — ED Notes (Signed)
Phlebotomy at the bedside  

## 2012-08-13 NOTE — H&P (Signed)
Admit date: 08/13/2012 Referring Physician Dr. Bebe Shaggy Primary Cardiologist Dr. Mayford Knife Chief complaint/reason for admission: Chest pain  HPI: This is a 72yo WM with a history of CAD with PCI in the past who was in his USOH until today when he was sitting and noticed some discomfort in his chest.  He had done some work in the yard the day before and thought it was muscular but it continued and he took a SL NTG with some improvement but it did not go away and so he came to the ER.  In the ER he was given another NTG with improvement and currently his pain is a 3/10.  He denies any SOB, diaphoresis or nausea but says this discomfort is similar to the pain he had prior with his cath.  Cardiology is now asked to admit for further treatment.    PMH:    Past Medical History  Diagnosis Date  . Environmental allergies   . Hypertension   . Hyperlipidemia   . 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 CAD (coronary artery disease) 2002    stent placement  . Macular degeneration   . Sleep apnea   . Esophageal reflux   . Sciatica   . Depression     PSH:    Past Surgical History  Procedure Laterality Date  . Angioplasty  2002    stent placement  . Elbow surgery  1998    left  . Back surgery  1998, 2002, 2005    lumbar fusion with hardware    ALLERGIES:   Percocet  Prior to Admit Meds:   (Not in a hospital admission) Family HX:    Family History  Problem Relation Age of Onset  . Colon cancer Neg Hx   . Stomach cancer Neg Hx   . Heart disease Brother   . Lung cancer Brother    Social HX:    History   Social History  . Marital Status: Married    Spouse Name: N/A    Number of Children: N/A  . Years of Education: N/A   Occupational History  . Not on file.   Social History Main Topics  . Smoking status: Former Smoker    Types: Cigarettes    Quit date: 05/21/1978  . Smokeless tobacco: Never Used  . Alcohol Use: 0.6 oz/week    1 Glasses of wine per week     Comment:  occasional  . Drug Use: No  . Sexually Active: Not on file   Other Topics Concern  . Not on file   Social History Narrative  . No narrative on file     ROS:  All 11 ROS were addressed and are negative except what is stated in the HPI  PHYSICAL EXAM Filed Vitals:   08/13/12 1813  BP: 106/70  Pulse:   Temp:   Resp: 18   General: Well developed, well nourished, in no acute distress Head: Eyes PERRLA, No xanthomas.   Normal cephalic and atramatic  Lungs:   Clear bilaterally to auscultation and percussion. Heart:   HRRR S1 S2 Pulses are 2+ & equal.            No carotid bruit. No JVD.  No abdominal bruits. No femoral bruits. Abdomen: Bowel sounds are positive, abdomen soft and non-tender without masses  Extremities:   No clubbing, cyanosis or edema.  DP +1 Neuro: Alert and oriented X 3. Psych:  Good affect, responds appropriately   Labs:  Lab Results  Component Value Date   WBC 5.6 08/13/2012   HGB 13.3 08/13/2012   HCT 37.3* 08/13/2012   MCV 81.6 08/13/2012   PLT 224 08/13/2012    Recent Labs Lab 08/13/12 1605  NA 138  K 3.4*  CL 101  CO2 27  BUN 16  CREATININE 0.92  CALCIUM 9.1  GLUCOSE 140*   Lab Results  Component Value Date   CKTOTAL 144 10/09/2006   CKMB 3.4 10/09/2006   TROPONINI <0.30 08/13/2012   No results found for this basename: PTT   Lab Results  Component Value Date   INR 1.11 08/13/2012   INR 1.1 10/08/2006         Radiology:  *RADIOLOGY REPORT*  Clinical Data: Chest pain, hypertension  CHEST - 2 VIEW  Comparison: 05/28/2012  Findings:  Normal heart size, mediastinal contours, and pulmonary vascularity.  Lungs clear.  No pleural effusion or pneumothorax.  Multilevel endplate spur formation thoracic spine.  Old fracture lateral left seventh rib.  IMPRESSION:  No acute abnormalities.  Original Report Authenticated By: Ulyses Southward, M.D.   EKG:  NSR  ASSESSMENT:  1.  Chest pain similar to prior angina in past.  EKG with no ischemia and  initial enzymes negative. 2.  CAD s/p remote PCI of LAD 3.  HTN 4.  GERD 5.  OSA  6.  Dyslipidemia 7.  Depression 8.  Hypokalemia  PLAN:   1.  Admit to tele bed 23 hours obs 2.  Cycle cardiac enzymes 3.  IV Heparin gtt 4.  NTP as BP tolerates 5.  ASA/statin/ARB 6.  No beta blocker secondary to borderline BP 7.  NPO after midnight 8.  Nuclear stress test in am if enzymes all negative 9.  Replete potassium  Quintella Reichert, MD  08/13/2012  7:44 PM

## 2012-08-13 NOTE — Progress Notes (Signed)
ANTICOAGULATION CONSULT NOTE - Initial Consult  Pharmacy Consult for Heparin Indication: chest pain/ACS  Allergies  Allergen Reactions  . Percocet (Oxycodone-Acetaminophen) Itching    Patient Measurements:   Ht: 68 in Wt: 195 lbs (88.6 kg) IBW: 68.4kg  Heparin Dosing Weight: 86.2kg  Vital Signs: Temp: 97.4 F (36.3 C) (05/06 1617) Temp src: Oral (05/06 1617) BP: 99/65 mmHg (05/06 1617) Pulse Rate: 70 (05/06 1617)  Labs:  Recent Labs  08/13/12 1605  HGB 13.3  HCT 37.3*  PLT 224  CREATININE 0.92    The CrCl is unknown because both a height and weight (above a minimum accepted value) are required for this calculation.   Medical History: Past Medical History  Diagnosis Date  . Environmental allergies   . Hypertension   . Hyperlipidemia   . 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 CAD (coronary artery disease) 2002    stent placement  . Macular degeneration   . Sleep apnea   . Esophageal reflux   . Sciatica   . Depression     Medications:   (Not in a hospital admission)  Assessment: 72 y/o with CP to start heparin. CBC stable. No bleeding noted per patient. Renal function stable with Scr 0.92. INR and PTT will be ordered, but do not anticipate out of range, so will not delay heparin start. No AC meds at home per patient, states ASA 325mg /day as well.    Goal of Therapy:  Heparin level 0.3-0.7 units/ml Monitor platelets by anticoagulation protocol: Yes   Plan:  -Heparin 4000 units BOLUS x 1  -Start heparin infusion at 1000 units/hr -6 hour HL at 0100 -Daily CBC/HL -f/u INR, PTT  Abran Duke, PharmD Clinical Pharmacist Phone: [phone] Pager: [phone] 08/13/2012 5:51 PM

## 2012-08-13 NOTE — ED Notes (Signed)
Dr. Wickline at the bedside.  

## 2012-08-13 NOTE — ED Notes (Addendum)
Pt c/o midsternal CP starting approx 1.5 hour ago; pt denies SOB; pt sts pain across chest

## 2012-08-13 NOTE — ED Provider Notes (Signed)
Patient seen/examined in the Emergency Department in conjunction with Resident Physician Provider Beverely Pace Patient reports chest pressure Exam : awake/alert, no distress, reports current pain 2/10 Plan: admit to cardiology for likely unstable angina   Joya Gaskins, MD 08/13/12 1840

## 2012-08-14 ENCOUNTER — Observation Stay (HOSPITAL_COMMUNITY): Payer: Medicare Other

## 2012-08-14 ENCOUNTER — Encounter (HOSPITAL_COMMUNITY): Payer: Self-pay | Admitting: *Deleted

## 2012-08-14 DIAGNOSIS — R079 Chest pain, unspecified: Secondary | ICD-10-CM | POA: Diagnosis not present

## 2012-08-14 DIAGNOSIS — I10 Essential (primary) hypertension: Secondary | ICD-10-CM | POA: Diagnosis not present

## 2012-08-14 DIAGNOSIS — E876 Hypokalemia: Secondary | ICD-10-CM | POA: Diagnosis present

## 2012-08-14 DIAGNOSIS — I200 Unstable angina: Secondary | ICD-10-CM | POA: Diagnosis not present

## 2012-08-14 DIAGNOSIS — I2510 Atherosclerotic heart disease of native coronary artery without angina pectoris: Secondary | ICD-10-CM | POA: Diagnosis not present

## 2012-08-14 DIAGNOSIS — E785 Hyperlipidemia, unspecified: Secondary | ICD-10-CM | POA: Diagnosis not present

## 2012-08-14 LAB — CBC WITH DIFFERENTIAL/PLATELET
Basophils Relative: 1 % (ref 0–1)
Eosinophils Absolute: 0.3 10*3/uL (ref 0.0–0.7)
Eosinophils Relative: 6 % — ABNORMAL HIGH (ref 0–5)
HCT: 34.7 % — ABNORMAL LOW (ref 39.0–52.0)
Hemoglobin: 12.1 g/dL — ABNORMAL LOW (ref 13.0–17.0)
Lymphs Abs: 2.1 10*3/uL (ref 0.7–4.0)
MCH: 28.4 pg (ref 26.0–34.0)
MCHC: 34.9 g/dL (ref 30.0–36.0)
MCV: 81.5 fL (ref 78.0–100.0)
Monocytes Absolute: 0.5 10*3/uL (ref 0.1–1.0)
Monocytes Relative: 9 % (ref 3–12)
Neutrophils Relative %: 44 % (ref 43–77)
RBC: 4.26 MIL/uL (ref 4.22–5.81)

## 2012-08-14 LAB — BASIC METABOLIC PANEL
BUN: 12 mg/dL (ref 6–23)
Chloride: 105 mEq/L (ref 96–112)
Creatinine, Ser: 0.88 mg/dL (ref 0.50–1.35)
GFR calc Af Amer: >90 mL/min (ref >90)
GFR calc non Af Amer: 85 mL/min — ABNORMAL LOW (ref >90)
Potassium: 3.7 mEq/L (ref 3.5–5.1)

## 2012-08-14 LAB — HEPARIN LEVEL (UNFRACTIONATED)
Heparin Unfractionated: 0.29 IU/mL — ABNORMAL LOW (ref 0.30–0.70)
Heparin Unfractionated: 0.34 IU/mL (ref 0.30–0.70)

## 2012-08-14 LAB — COMPREHENSIVE METABOLIC PANEL
ALT: 16 U/L (ref 0–53)
BUN: 13 mg/dL (ref 6–23)
CO2: 29 mEq/L (ref 19–32)
Calcium: 8.4 mg/dL (ref 8.4–10.5)
Creatinine, Ser: 0.91 mg/dL (ref 0.50–1.35)
GFR calc Af Amer: >90 mL/min (ref >90)
GFR calc non Af Amer: 84 mL/min — ABNORMAL LOW (ref >90)
Glucose, Bld: 116 mg/dL — ABNORMAL HIGH (ref 70–99)
Total Protein: 5.9 g/dL — ABNORMAL LOW (ref 6.0–8.3)

## 2012-08-14 LAB — TROPONIN I
Troponin I: <0.3 ng/mL (ref <0.30)
Troponin I: <0.3 ng/mL (ref <0.30)

## 2012-08-14 LAB — CBC
HCT: 35.7 % — ABNORMAL LOW (ref 39.0–52.0)
Hemoglobin: 12.4 g/dL — ABNORMAL LOW (ref 13.0–17.0)
MCHC: 34.7 g/dL (ref 30.0–36.0)
WBC: 7.3 10*3/uL (ref 4.0–10.5)

## 2012-08-14 LAB — MRSA PCR SCREENING: MRSA by PCR: NEGATIVE

## 2012-08-14 LAB — POCT I-STAT TROPONIN I

## 2012-08-14 LAB — PROTIME-INR: INR: 1.19 (ref 0.00–1.49)

## 2012-08-14 MED ORDER — POTASSIUM CHLORIDE CRYS ER 20 MEQ PO TBCR
20.0000 meq | EXTENDED_RELEASE_TABLET | Freq: Every day | ORAL | Status: DC
  Filled 2012-08-14: qty 1

## 2012-08-14 MED ORDER — SIMVASTATIN 40 MG PO TABS
40.0000 mg | ORAL_TABLET | Freq: Every evening | ORAL | Status: DC
  Administered 2012-08-14: 40 mg via ORAL
  Filled 2012-08-14: qty 1

## 2012-08-14 MED ORDER — REGADENOSON 0.4 MG/5ML IV SOLN
INTRAVENOUS | Status: AC
  Administered 2012-08-14: 0.4 mg via INTRAVENOUS
  Filled 2012-08-14: qty 5

## 2012-08-14 MED ORDER — POTASSIUM CHLORIDE CRYS ER 20 MEQ PO TBCR: 20.0000 meq | EXTENDED_RELEASE_TABLET | Freq: Two times a day (BID) | ORAL | Status: DC

## 2012-08-14 MED ORDER — POTASSIUM CHLORIDE CRYS ER 20 MEQ PO TBCR: 40.0000 meq | EXTENDED_RELEASE_TABLET | Freq: Two times a day (BID) | ORAL | Status: DC

## 2012-08-14 MED ORDER — TECHNETIUM TC 99M SESTAMIBI GENERIC - CARDIOLITE: 10.0000 | Freq: Once | INTRAVENOUS | Status: DC | PRN

## 2012-08-14 MED ORDER — TECHNETIUM TC 99M SESTAMIBI GENERIC - CARDIOLITE
10.0000 | Freq: Once | INTRAVENOUS | Status: AC | PRN
  Administered 2012-08-14: 10 via INTRAVENOUS

## 2012-08-14 MED ORDER — REGADENOSON 0.4 MG/5ML IV SOLN
0.4000 mg | Freq: Once | INTRAVENOUS | Status: AC
  Filled 2012-08-14: qty 5

## 2012-08-14 MED ORDER — TECHNETIUM TC 99M SESTAMIBI GENERIC - CARDIOLITE
30.0000 | Freq: Once | INTRAVENOUS | Status: AC | PRN
  Administered 2012-08-14: 30 via INTRAVENOUS

## 2012-08-14 MED ORDER — CALCIUM CITRATE-VITAMIN D 200-200 MG-UNIT PO TABS
1.0000 | ORAL_TABLET | Freq: Every day | ORAL | Status: DC
Start: 2012-08-14 — End: 2012-08-14

## 2012-08-14 MED ORDER — ASPIRIN 325 MG PO TABS
325.0000 mg | ORAL_TABLET | Freq: Every day | ORAL | Status: DC
  Administered 2012-08-14: 325 mg via ORAL
  Filled 2012-08-14: qty 1

## 2012-08-14 MED ORDER — CALCIUM CARBONATE-VITAMIN D 500-200 MG-UNIT PO TABS
1.0000 | ORAL_TABLET | Freq: Every day | ORAL | Status: DC
  Administered 2012-08-14: 1 via ORAL
  Filled 2012-08-14: qty 1

## 2012-08-14 MED ORDER — NITROGLYCERIN 0.4 MG SL SUBL: 0.4000 mg | SUBLINGUAL_TABLET | SUBLINGUAL | Status: DC | PRN

## 2012-08-14 MED ORDER — ONDANSETRON HCL 4 MG/2ML IJ SOLN: 4.0000 mg | Freq: Four times a day (QID) | INTRAMUSCULAR | Status: DC | PRN

## 2012-08-14 MED ORDER — POTASSIUM CHLORIDE CRYS ER 20 MEQ PO TBCR: 40.0000 meq | EXTENDED_RELEASE_TABLET | Freq: Every day | ORAL | Status: DC

## 2012-08-14 MED ORDER — ASPIRIN 325 MG PO TABS: 325.0000 mg | ORAL_TABLET | Freq: Every day | ORAL | Status: DC

## 2012-08-14 MED ORDER — VITAMIN D3 25 MCG (1000 UNIT) PO TABS
1000.0000 [IU] | ORAL_TABLET | Freq: Every day | ORAL | Status: DC
Start: 2012-08-14 — End: 2012-08-14
  Administered 2012-08-14: 1000 [IU] via ORAL
  Filled 2012-08-14: qty 1

## 2012-08-14 MED ORDER — ZOLPIDEM TARTRATE 5 MG PO TABS: 5.0000 mg | ORAL_TABLET | Freq: Every evening | ORAL | Status: DC | PRN

## 2012-08-14 MED ORDER — ACETAMINOPHEN 325 MG PO TABS: 650.0000 mg | ORAL_TABLET | ORAL | Status: DC | PRN

## 2012-08-14 MED ORDER — NIACIN ER (ANTIHYPERLIPIDEMIC) 500 MG PO TBCR
1000.0000 mg | EXTENDED_RELEASE_TABLET | Freq: Every day | ORAL | Status: DC
  Administered 2012-08-14: 1000 mg via ORAL
  Filled 2012-08-14 (×2): qty 2

## 2012-08-14 MED ORDER — HYDROCHLOROTHIAZIDE 25 MG PO TABS
25.0000 mg | ORAL_TABLET | Freq: Every day | ORAL | Status: DC
  Administered 2012-08-14: 25 mg via ORAL
  Filled 2012-08-14: qty 1

## 2012-08-14 MED ORDER — IRBESARTAN 150 MG PO TABS
150.0000 mg | ORAL_TABLET | Freq: Every day | ORAL | Status: DC
  Administered 2012-08-14: 150 mg via ORAL
  Filled 2012-08-14: qty 1

## 2012-08-14 MED ORDER — DOCUSATE SODIUM 100 MG PO CAPS
100.0000 mg | ORAL_CAPSULE | Freq: Two times a day (BID) | ORAL | Status: DC
  Administered 2012-08-14 (×2): 100 mg via ORAL
  Filled 2012-08-14 (×3): qty 1

## 2012-08-14 MED ORDER — POTASSIUM CHLORIDE CRYS ER 20 MEQ PO TBCR
20.0000 meq | EXTENDED_RELEASE_TABLET | Freq: Once | ORAL | Status: AC
  Administered 2012-08-14: 20 meq via ORAL

## 2012-08-14 NOTE — Discharge Summary (Signed)
Patient ID: Mark [name] MRN: [id number] DOB/AGE: [birth year] 72 y.o.  Admit date: 08/13/2012 Discharge date: 08/14/2012  Primary Discharge Diagnosis: Chest pain Secondary Discharge Diagnosis: Coronary artery disease status post previously placed LAD stent, hyperlipidemia, hypertension  Significant Diagnostic Studies: Nuclear stress test 08/14/12-low risk, no ischemia, normal EF 71%   Hospital Course: HPI from H&P:  This is [name] 72yo WM with [name] history of CAD with PCI to LAD in the past who was in his USOH until today when he was sitting and noticed some discomfort in his chest. He had done some work in the yard the day before and thought it was muscular but it continued and he took [name] SL NTG with some improvement but it did not go away and so he came to the ER. In the ER he was given another NTG with improvement and currently his pain is [name] 3/10. He denies any SOB, diaphoresis or nausea but says this discomfort is similar to the pain he had prior with his cath. Cardiology is now asked to admit for further treatment.  Cardiac biomarkers were all normal. He underwent nuclear stress test earlier today that showed no evidence of ischemia. He is currently chest pain-free. Ambulating well. He is eager to be discharged home.    Discharge Exam: Blood pressure 116/67, pulse 60, temperature 97.9 F (36.6 C), temperature source Oral, resp. rate 14, height 5\' 8"  (1.727 m), weight 92.7 kg (204 lb 5.9 oz), SpO2 97.00%.   General: Alert and oriented x3 in no acute distress Cardiovascular: Regular rate and rhythm no murmurs rubs or gallops Lungs: Clear to auscultation bilaterally Abdomen: Soft, nontender positive bowel sounds Extremities: No edema. Labs:   Lab Results  Component Value Date   WBC 7.3 08/14/2012   HGB 12.4* 08/14/2012   HCT 35.7* 08/14/2012   MCV 82.3 08/14/2012   PLT 172 08/14/2012    Recent Labs Lab 08/14/12 0049 08/14/12 0841  NA 141 139  K 2.9* 3.7  CL 106 105  CO2 29 29  BUN 13  12  CREATININE 0.91 0.88  CALCIUM 8.4 8.5  PROT 5.9*  --   BILITOT 0.7  --   ALKPHOS 64  --   ALT 16  --   AST 21  --   GLUCOSE 116* 108*   Lab Results  Component Value Date   CKTOTAL 144 10/09/2006   CKMB 3.4 10/09/2006   TROPONINI <0.30 08/14/2012       Radiology: Nuclear stress test low risk, no ischemia EKG: Normal sinus rhythm/sinus bradycardia with no ST segment changes  FOLLOW UP PLANS AND APPOINTMENTS Discharge Orders   Future Orders Complete By Expires     Diet - low sodium heart healthy  As directed     Increase activity slowly  As directed         Medication List    TAKE these medications       aspirin 325 MG tablet  Take 1 tablet (325 mg total) by mouth daily.  Start taking on:  08/15/2012     aspirin 325 MG tablet  Take 325 mg by mouth [name] evening.     Calcium Citrate-Vitamin D 250-200 MG-UNIT Tabs  Take 1 tablet by mouth daily.     docusate sodium 100 MG capsule  Commonly known as:  COLACE  Take 100 mg by mouth daily.     Fish Oil 1200 MG Caps  Take 2,400 mg by mouth 2 (two) times daily.  hydrochlorothiazide 25 MG tablet  Commonly known as:  HYDRODIURIL  Take 25 mg by mouth daily.     ICAPS PO  Take 2 capsules by mouth daily.     niacin 1000 MG CR tablet  Commonly known as:  NIASPAN  Take 1,000 mg by mouth at bedtime.     nitroGLYCERIN 0.4 MG SL tablet  Commonly known as:  NITROSTAT  Place 0.4 mg under the tongue [name] 5 (five) minutes as needed for chest pain.     polycarbophil 625 MG tablet  Commonly known as:  FIBERCON  Take 1,250 mg by mouth daily.     potassium chloride SA 20 MEQ tablet  Commonly known as:  K-DUR,KLOR-CON  Take 1 tablet (20 mEq total) by mouth 2 (two) times daily.     simvastatin 40 MG tablet  Commonly known as:  ZOCOR  Take 40 mg by mouth [name] evening.     testosterone 50 MG/5GM Gel  Commonly known as:  ANDROGEL  Place 5 g onto the skin daily.     valsartan 320 MG tablet  Commonly known as:  DIOVAN   Take 160 mg by mouth daily.           Follow-up Information   Follow up with Mark A, NP On 08/28/2012. (at 9:00am)    Contact information:   [name] [address], [address] [place] [address] [phone]       BRING ALL MEDICATIONS WITH YOU TO FOLLOW UP APPOINTMENTS  Time spent with patient to include physician time:20 minutes. I discussed findings with patient. No changes in medicines. I discussed with him, nurse on floor.  SignedDonato Schultz 08/14/2012, 7:10 PM

## 2012-08-14 NOTE — Progress Notes (Signed)
Report from Night RN. Chart reviewed together. Handoff complete.Introductions complete. Will continue to monitor and advise attending as needed.   

## 2012-08-14 NOTE — Progress Notes (Signed)
Discharge teaching complete. Awaiting family for transport. Report to Night RN.

## 2012-08-14 NOTE — Progress Notes (Signed)
Utilization Review Completed.Mark [name] T5/10/2012

## 2012-08-14 NOTE — Progress Notes (Signed)
SUBJECTIVE:  Feeling much better - says he has about 1/10 tightness currently  OBJECTIVE:   Vitals:   Filed Vitals:   08/13/12 2345 08/14/12 0030 08/14/12 0400 08/14/12 0800  BP: 104/62 108/63 107/70 126/77  Pulse: 62 56 60   Temp:  98.6 F (37 C) 98.3 F (36.8 C) 97.8 F (36.6 C)  TempSrc:  Oral Oral Oral  Resp: 20 15 18 17   Height:  5\' 8"  (1.727 m)    Weight:  92.7 kg (204 lb 5.9 oz)    SpO2: 95% 98% 98% 97%   I&O's:   Intake/Output Summary (Last 24 hours) at 08/14/12 0830 Last data filed at 08/14/12 0600  Gross per 24 hour  Intake     75 ml  Output    840 ml  Net   -765 ml   TELEMETRY: Reviewed telemetry pt in NSR:     PHYSICAL EXAM General: Well developed, well nourished, in no acute distress Head: Eyes PERRLA, No xanthomas.   Normal cephalic and atramatic  Lungs:   Clear bilaterally to auscultation and percussion. Heart:   HRRR S1 S2 Pulses are 2+ & equal. Abdomen: Bowel sounds are positive, abdomen soft and non-tender without masses Extremities:   No clubbing, cyanosis or edema.  DP +1 Neuro: Alert and oriented X 3. Psych:  Good affect, responds appropriately   LABS: Basic Metabolic Panel:  Recent Labs  16/10/96 1605 08/14/12 0049  NA 138 141  K 3.4* 2.9*  CL 101 106  CO2 27 29  GLUCOSE 140* 116*  BUN 16 13  CREATININE 0.92 0.91  CALCIUM 9.1 8.4   Liver Function Tests:  Recent Labs  08/14/12 0049  AST 21  ALT 16  ALKPHOS 64  BILITOT 0.7  PROT 5.9*  ALBUMIN 3.2*   No results found for this basename: LIPASE, AMYLASE,  in the last 72 hours CBC:  Recent Labs  08/13/12 1605 08/14/12 0049  WBC 5.6 5.2  NEUTROABS  --  2.3  HGB 13.3 12.1*  HCT 37.3* 34.7*  MCV 81.6 81.5  PLT 224 174   Cardiac Enzymes:  Recent Labs  08/13/12 1754 08/14/12 0049  TROPONINI <0.30 <0.30   Coag Panel:   Lab Results  Component Value Date   INR 1.19 08/14/2012   INR 1.11 08/13/2012   INR 1.1 10/08/2006    RADIOLOGY: Dg Chest 2 View  08/13/2012   *RADIOLOGY REPORT*  Clinical Data: Chest pain, hypertension  CHEST - 2 VIEW  Comparison: 05/28/2012  Findings: Normal heart size, mediastinal contours, and pulmonary vascularity. Lungs clear. No pleural effusion or pneumothorax. Multilevel endplate spur formation thoracic spine. Old fracture lateral left seventh rib.  IMPRESSION: No acute abnormalities.   Original Report Authenticated By: Ulyses Southward, M.D.    Gna Rad Results  08/02/2012  Ordered by an unspecified provider.    ASSESSMENT:  1. Chest pain similar to prior angina in past. EKG with no ischemia and enzymes negative.  2. CAD s/p remote PCI of LAD  3. HTN  4. GERD  5. OSA  6. Dyslipidemia  7. Depression  8. Hypokalemia   PLAN:  1. ASA/statin/ARB  2. No beta blocker secondary to borderline BP  3. Nuclear stress test today 9. Replete potassium        Quintella Reichert, MD  08/14/2012  8:30 AM

## 2012-08-14 NOTE — Progress Notes (Signed)
ANTICOAGULATION CONSULT NOTE Pharmacy Consult for Heparin Indication: chest pain/ACS  Allergies  Allergen Reactions  . Codeine Itching  . Percocet (Oxycodone-Acetaminophen) Itching    Patient Measurements: Height: 5\' 8"  (172.7 cm) Weight: 204 lb 5.9 oz (92.7 kg) IBW/kg (Calculated) : 68.4 Heparin Dosing Weight: 86.2kg  Vital Signs: Temp: 98.6 F (37 C) (05/07 0030) Temp src: Oral (05/07 0030) BP: 108/63 mmHg (05/07 0030) Pulse Rate: 56 (05/07 0030)  Labs:  Recent Labs  08/13/12 1605 08/13/12 1754 08/14/12 0049  HGB 13.3  --  12.1*  HCT 37.3*  --  34.7*  PLT 224  --  174  APTT  --  30 71*  LABPROT  --  14.2 14.9  INR  --  1.11 1.19  HEPARINUNFRC  --   --  0.29*  CREATININE 0.92  --  0.91  TROPONINI  --  <0.30 <0.30    Estimated Creatinine Clearance: 83.4 ml/min (by C-G formula based on Cr of 0.91).  Assessment: 72 y/o with chest pain for heparin Goal of Therapy:  Heparin level 0.3-0.7 units/ml Monitor platelets by anticoagulation protocol: Yes   Plan:  Increase Heparin 1300 units/hr Check heparin level in 8 hours.  Geannie Risen, PharmD, BCPS  -08/14/2012 1:58 AM

## 2012-08-15 NOTE — ED Provider Notes (Signed)
I have personally seen and examined the patient.  I have discussed the plan of care with the resident.  I have reviewed the documentation on PMH/FH/Soc. History.  I have reviewed the documentation of the resident and agree.   Date: 08/13/2012  Rate: 83  Rhythm: normal sinus rhythm  QRS Axis: normal  Intervals: normal  ST/T Wave abnormalities: normal  Conduction Disutrbances:none     Joya Gaskins, MD 08/15/12 [phone]

## 2012-08-18 ENCOUNTER — Other Ambulatory Visit: Payer: Self-pay

## 2012-08-18 ENCOUNTER — Emergency Department (HOSPITAL_COMMUNITY): Payer: Medicare Other

## 2012-08-18 ENCOUNTER — Emergency Department (HOSPITAL_COMMUNITY)
Admission: EM | Admit: 2012-08-18 | Discharge: 2012-08-19 | Disposition: A | Discharge status: Home / Self Care | Payer: Medicare Other | Attending: Emergency Medicine | Admitting: Emergency Medicine

## 2012-08-18 ENCOUNTER — Encounter (HOSPITAL_COMMUNITY): Payer: Self-pay

## 2012-08-18 DIAGNOSIS — Z862 Personal history of diseases of the blood and blood-forming organs and certain disorders involving the immune mechanism: Secondary | ICD-10-CM | POA: Diagnosis not present

## 2012-08-18 DIAGNOSIS — Z8639 Personal history of other endocrine, nutritional and metabolic disease: Secondary | ICD-10-CM | POA: Insufficient documentation

## 2012-08-18 DIAGNOSIS — Z79899 Other long term (current) drug therapy: Secondary | ICD-10-CM | POA: Diagnosis not present

## 2012-08-18 DIAGNOSIS — R079 Chest pain, unspecified: Secondary | ICD-10-CM

## 2012-08-18 DIAGNOSIS — I252 Old myocardial infarction: Secondary | ICD-10-CM | POA: Insufficient documentation

## 2012-08-18 DIAGNOSIS — R0789 Other chest pain: Secondary | ICD-10-CM | POA: Insufficient documentation

## 2012-08-18 DIAGNOSIS — R110 Nausea: Secondary | ICD-10-CM | POA: Insufficient documentation

## 2012-08-18 DIAGNOSIS — Z87891 Personal history of nicotine dependence: Secondary | ICD-10-CM | POA: Insufficient documentation

## 2012-08-18 DIAGNOSIS — Z8669 Personal history of other diseases of the nervous system and sense organs: Secondary | ICD-10-CM | POA: Insufficient documentation

## 2012-08-18 DIAGNOSIS — I10 Essential (primary) hypertension: Secondary | ICD-10-CM | POA: Insufficient documentation

## 2012-08-18 DIAGNOSIS — R072 Precordial pain: Secondary | ICD-10-CM | POA: Diagnosis not present

## 2012-08-18 DIAGNOSIS — R0602 Shortness of breath: Secondary | ICD-10-CM | POA: Diagnosis not present

## 2012-08-18 DIAGNOSIS — Z7982 Long term (current) use of aspirin: Secondary | ICD-10-CM | POA: Diagnosis not present

## 2012-08-18 DIAGNOSIS — Z8719 Personal history of other diseases of the digestive system: Secondary | ICD-10-CM | POA: Diagnosis not present

## 2012-08-18 DIAGNOSIS — R059 Cough, unspecified: Secondary | ICD-10-CM | POA: Diagnosis not present

## 2012-08-18 DIAGNOSIS — F329 Major depressive disorder, single episode, unspecified: Secondary | ICD-10-CM | POA: Insufficient documentation

## 2012-08-18 DIAGNOSIS — Z9861 Coronary angioplasty status: Secondary | ICD-10-CM | POA: Insufficient documentation

## 2012-08-18 DIAGNOSIS — I2510 Atherosclerotic heart disease of native coronary artery without angina pectoris: Secondary | ICD-10-CM

## 2012-08-18 DIAGNOSIS — R05 Cough: Secondary | ICD-10-CM | POA: Diagnosis not present

## 2012-08-18 DIAGNOSIS — M5430 Sciatica, unspecified side: Secondary | ICD-10-CM | POA: Diagnosis not present

## 2012-08-18 DIAGNOSIS — F3289 Other specified depressive episodes: Secondary | ICD-10-CM | POA: Insufficient documentation

## 2012-08-18 LAB — CBC
HCT: 38.6 % — ABNORMAL LOW (ref 39.0–52.0)
Hemoglobin: 13.4 g/dL (ref 13.0–17.0)
MCH: 29.2 pg (ref 26.0–34.0)
MCHC: 34.7 g/dL (ref 30.0–36.0)

## 2012-08-18 LAB — BASIC METABOLIC PANEL
BUN: 16 mg/dL (ref 6–23)
Calcium: 9.2 mg/dL (ref 8.4–10.5)
GFR calc non Af Amer: 72 mL/min — ABNORMAL LOW (ref >90)
Glucose, Bld: 110 mg/dL — ABNORMAL HIGH (ref 70–99)
Potassium: 3.6 mEq/L (ref 3.5–5.1)

## 2012-08-18 LAB — POCT I-STAT TROPONIN I: Troponin i, poc: 0 ng/mL (ref 0.00–0.08)

## 2012-08-18 MED ORDER — ASPIRIN 81 MG PO CHEW
162.0000 mg | CHEWABLE_TABLET | Freq: Once | ORAL | Status: AC
  Administered 2012-08-18: 162 mg via ORAL
  Filled 2012-08-18: qty 2

## 2012-08-18 MED ORDER — ISOSORBIDE MONONITRATE ER 30 MG PO TB24: 30.0000 mg | ORAL_TABLET | Freq: Every day | ORAL | Status: DC

## 2012-08-18 MED ORDER — NITROGLYCERIN 0.4 MG SL SUBL
0.4000 mg | SUBLINGUAL_TABLET | SUBLINGUAL | Status: DC | PRN
  Administered 2012-08-18 (×2): 0.4 mg via SUBLINGUAL

## 2012-08-18 MED ORDER — MORPHINE SULFATE 4 MG/ML IJ SOLN
4.0000 mg | Freq: Once | INTRAMUSCULAR | Status: DC
  Filled 2012-08-18: qty 1

## 2012-08-18 NOTE — ED Notes (Signed)
Per EMS pt from home, pt c/o Left side chest pain radiating into his Left shoulder and Left lateral neck starting at 1430 while at rest became progressively worse tonight at 1730, pt took nirto x2 approx 1840 and ASA 325 mg prior to arrival, pain decreased from a 4 to a 3 with the nitro. VSS BP 142/78, HR 60, RR 14, pain 3/10. Pt was seen here for the same 2 days ago. Pt had a similar episode 13 years ago and all test came back negative, d/t his symptoms not resolving they did a cardiac cath and found a 98% blockage

## 2012-08-19 NOTE — ED Notes (Signed)
Pt dc'd home with all belongings, pt alert and ambulatory upon dc, pt verbalizes understanding of following up with a cardiologist and starting the new medication, pt driven home by spouse, pt not given Morphine 4mg  d/t low SBP, pt verbalizes understand

## 2012-08-19 NOTE — ED Provider Notes (Signed)
History     CSN: [id number]  Arrival date & time 08/18/12  4098   First MD Initiated Contact with Patient 08/18/12 1948      Chief Complaint  Patient presents with  . Chest Pain    Patient is a 72 y.o. male presenting with chest pain.  Chest Pain Pain location:  Substernal area Pain quality: dull   Pain radiates to:  L arm Pain radiates to the back: no   Pain severity:  Moderate Onset quality:  Gradual Duration:  5 hours Timing:  Constant Progression since onset: initially worsening over the past few hours, but now improved after 2 NTG, SL. Chronicity:  Recurrent Context: at rest   Relieved by:  Nitroglycerin Exacerbated by: nothing. Associated symptoms: nausea and shortness of breath   Associated symptoms: no abdominal pain, no altered mental status, no back pain, no cough, no diaphoresis, no fever, no headache, no numbness, no orthopnea, no palpitations, no PND, no syncope, not vomiting and no weakness   Risk factors: coronary artery disease, high cholesterol, hypertension and smoking (former)   Risk factors: no aortic disease, no diabetes mellitus, no immobilization, no prior DVT/PE and no surgery    Patient was seen here about 5 days ago with similar symptoms.  At that time, he was admitted and had serial troponins (negative) and a low risk nuclear stress test.  States his symptoms are different today; location is different, and radiation to arm is new.  Past Medical History  Diagnosis Date  . Environmental allergies   . Hypertension   . Hyperlipidemia   . 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 CAD (coronary artery disease) 2002    stent placement  . Macular degeneration   . Sleep apnea   . Esophageal reflux   . Sciatica   . Depression     Past Surgical History  Procedure Laterality Date  . Angioplasty  2002    stent placement  . Elbow surgery  1998    left  . Back surgery  1998, 2002, 2005    lumbar fusion with hardware    Family History   Problem Relation Age of Onset  . Colon cancer Neg Hx   . Stomach cancer Neg Hx   . Heart disease Brother   . Lung cancer Brother     History  Substance Use Topics  . Smoking status: Former Smoker    Types: Cigarettes    Quit date: 05/21/1978  . Smokeless tobacco: Never Used  . Alcohol Use: 0.6 oz/week    1 Glasses of wine per week     Comment: occasional      Review of Systems  Constitutional: Negative for fever, chills and diaphoresis.  HENT: Negative for congestion, rhinorrhea, neck pain and neck stiffness.   Eyes: Negative for visual disturbance.  Respiratory: Positive for shortness of breath. Negative for cough.   Cardiovascular: Positive for chest pain. Negative for palpitations, orthopnea, leg swelling, syncope and PND.  Gastrointestinal: Positive for nausea. Negative for vomiting, abdominal pain and diarrhea.  Genitourinary: Negative for dysuria, urgency, frequency, flank pain and difficulty urinating.  Musculoskeletal: Negative for back pain.  Skin: Negative for rash.  Neurological: Negative for syncope, weakness, numbness and headaches.  Psychiatric/Behavioral: Negative for altered mental status.  All other systems reviewed and are negative.    Allergies  Codeine and Percocet  Home Medications   Current Outpatient Rx  Name  Route  Sig  Dispense  Refill  . aspirin 325  MG tablet   Oral   Take 325 mg by mouth every evening.         . Calcium Citrate-Vitamin D 250-200 MG-UNIT TABS   Oral   Take 1 tablet by mouth daily.         Marland Kitchen docusate sodium (COLACE) 100 MG capsule   Oral   Take 100 mg by mouth daily.         . hydrochlorothiazide (HYDRODIURIL) 25 MG tablet   Oral   Take 25 mg by mouth daily.         . Multiple Vitamins-Minerals (ICAPS PO)   Oral   Take 2 capsules by mouth daily.         . niacin (NIASPAN) 1000 MG CR tablet   Oral   Take 1,000 mg by mouth at bedtime.         . nitroGLYCERIN (NITROSTAT) 0.4 MG SL tablet    Sublingual   Place 0.4 mg under the tongue every 5 (five) minutes as needed for chest pain.         . Omega-3 Fatty Acids (FISH OIL) 1200 MG CAPS   Oral   Take 2,400 mg by mouth 2 (two) times daily.         . polycarbophil (FIBERCON) 625 MG tablet   Oral   Take 1,250 mg by mouth daily.         . potassium chloride SA (K-DUR,KLOR-CON) 20 MEQ tablet   Oral   Take 1 tablet (20 mEq total) by mouth 2 (two) times daily.   60 tablet   11   . testosterone (ANDROGEL) 50 MG/5GM GEL   Transdermal   Place 5 g onto the skin daily.         . valsartan (DIOVAN) 320 MG tablet   Oral   Take 160 mg by mouth daily.         . isosorbide mononitrate (IMDUR) 30 MG 24 hr tablet   Oral   Take 1 tablet (30 mg total) by mouth daily.   30 tablet   0     BP 105/59  Pulse 60  Temp(Src) 97.6 F (36.4 C) (Oral)  Resp 20  SpO2 97%  Physical Exam  Nursing note and vitals reviewed. Constitutional: He is oriented to person, place, and time. He appears well-developed and well-nourished. No distress.  HENT:  Head: Normocephalic and atraumatic.  Mouth/Throat: Oropharynx is clear and moist.  Eyes: Conjunctivae and EOM are normal. Pupils are equal, round, and reactive to light. No scleral icterus.  Neck: Normal range of motion. Neck supple. No JVD present.  Cardiovascular: Normal rate, regular rhythm, normal heart sounds and intact distal pulses.  Exam reveals no gallop and no friction rub.   No murmur heard. Pulmonary/Chest: Effort normal and breath sounds normal. No respiratory distress. He has no wheezes. He has no rales.  Abdominal: Soft. Bowel sounds are normal. He exhibits no distension. There is no tenderness. There is no rebound and no guarding.  Musculoskeletal: He exhibits no edema.  Neurological: He is alert and oriented to person, place, and time. No cranial nerve deficit. He exhibits normal muscle tone. Coordination normal.  Skin: Skin is warm and dry. He is not diaphoretic.     ED Course  Procedures (including critical care time)  Labs Reviewed  CBC - Abnormal; Notable for the following:    HCT 38.6 (*)    All other components within normal limits  BASIC METABOLIC PANEL - Abnormal; Notable for  the following:    Glucose, Bld 110 (*)    GFR calc non Af Amer 72 (*)    GFR calc Af Amer 84 (*)    All other components within normal limits  POCT I-STAT TROPONIN I   Dg Chest Port 1 View  08/18/2012  *RADIOLOGY REPORT*  Clinical Data: Chest pain, shortness of breath, cough.  PORTABLE CHEST - 1 VIEW  Comparison: 08/13/2012  Findings: Heart and mediastinal contours are within normal limits. No focal opacities or effusions.  No acute bony abnormality.  IMPRESSION: Negative.   Original Report Authenticated By: Charlett Nose, M.D.    EKG:  Date: 08/19/2012  Rate: 61  Rhythm: normal sinus rhtym  QRS Axis: normal  Intervals: normal PR, QRS, and QTc  ST/T Wave abnormalities: none  Conduction Disutrbances: none  Narrative Interpretation: normal EKG  Old EKG Reviewed: unchanged from 5/7    1. Chest pain   2. Coronary artery disease       MDM  72 yo M with h/o prior MI s/p stent in 2002 presents with chest pain.  Retrosternal with radiation to left arm.  Non-exertional.  Seen for the same about 6 days ago with negative enzymes and low risk stress test.  Pain different today.  He is well appearing, in NAD, and has a normal cardiopulmonary and pulse exam, no peripheral edema.  Exam otherwise unremarkable.  He has normal EKG and negative troponin.   Cardiology consulted and evaluated the patient in the ED.  They gave him the option of cath vs trial of medical management; they feel he is relatively low risk for medical management given normal EKG, normal troponins, normal stress test.  Patient discussed with family and cardiology and elects to try medical management.  Cardiology and myself discussed with patient that there is some risk to this approach, including  MI/death.  Gave return precautions.  Patient started on Imdur, discharged home, to have cardiology f/u appt in 1 week.        Toney Sang, MD 08/19/12 1056

## 2012-08-21 NOTE — ED Provider Notes (Signed)
I saw and evaluated the patient, reviewed the resident's note and I agree with the findings and plan.  72yM with CP. Known CAD. Just admitted for CP and r/o enzymatically and low risk stress testing. Today's symptoms different though. EKG stable. Initial w/u unremarkable. Discussed with cardiology who evaluated pt. Opted for medical management at this time. Close outpt cardiology FU.   Raeford Razor, MD 08/21/12 [phone]

## 2012-09-12 ENCOUNTER — Ambulatory Visit (INDEPENDENT_AMBULATORY_CARE_PROVIDER_SITE_OTHER): Payer: Medicare Other | Admitting: Cardiovascular Disease

## 2012-09-12 ENCOUNTER — Encounter: Payer: Self-pay | Admitting: Cardiovascular Disease

## 2012-09-12 VITALS — BP 118/82 | HR 88 | Ht 69.0 in | Wt 210.8 lb

## 2012-09-12 DIAGNOSIS — E785 Hyperlipidemia, unspecified: Secondary | ICD-10-CM | POA: Diagnosis not present

## 2012-09-12 DIAGNOSIS — I2510 Atherosclerotic heart disease of native coronary artery without angina pectoris: Secondary | ICD-10-CM

## 2012-09-12 MED ORDER — METOPROLOL TARTRATE 25 MG PO TABS: 25.0000 mg | ORAL_TABLET | Freq: Two times a day (BID) | ORAL | Status: DC

## 2012-09-12 NOTE — Assessment & Plan Note (Signed)
Will check lipids in 6 months.

## 2012-09-12 NOTE — Progress Notes (Signed)
[facility] Loleta Rose Date of Birth  1940-09-14       [facility] Office [address]  [address] [place], [place]  [address]   [place], [place]  [address] [phone]     [phone]   Fax  [phone]    Fax [phone]  Problem List: 1. CAD - PCI to LAD 2. Hypertension 3.   History of Present Illness:  Mark [name] was recently admitted to the hospital with some CP.  Stress myoview was negative.   He is back doing his normal activities.   He swims regularly ( 3 times a week)     Current Outpatient Prescriptions on File Prior to Visit  Medication Sig Dispense Refill  . aspirin 325 MG tablet Take 325 mg by mouth every evening.      . Calcium Citrate-Vitamin D 250-200 MG-UNIT TABS Take 1 tablet by mouth daily.      Marland Kitchen docusate sodium (COLACE) 100 MG capsule Take 100 mg by mouth daily.      . hydrochlorothiazide (HYDRODIURIL) 25 MG tablet Take 25 mg by mouth daily.      . isosorbide mononitrate (IMDUR) 30 MG 24 hr tablet Take 1 tablet (30 mg total) by mouth daily.  30 tablet  0  . Multiple Vitamins-Minerals (ICAPS PO) Take 2 capsules by mouth daily.      . niacin (NIASPAN) 1000 MG CR tablet Take 1,000 mg by mouth at bedtime.      . nitroGLYCERIN (NITROSTAT) 0.4 MG SL tablet Place 0.4 mg under the tongue every 5 (five) minutes as needed for chest pain.      . Omega-3 Fatty Acids (FISH OIL) 1200 MG CAPS Take 2,400 mg by mouth 2 (two) times daily.      . polycarbophil (FIBERCON) 625 MG tablet Take 1,250 mg by mouth 2 (two) times daily.       . potassium chloride SA (K-DUR,KLOR-CON) 20 MEQ tablet Take 1 tablet (20 mEq total) by mouth 2 (two) times daily.  60 tablet  11  . testosterone (ANDROGEL) 50 MG/5GM GEL Place 5 g onto the skin daily.      . valsartan (DIOVAN) 320 MG tablet Take 160 mg by mouth daily.       No current facility-administered medications on file prior to visit.    Allergies  Allergen Reactions  . Codeine  Itching  . Percocet (Oxycodone-Acetaminophen) Itching    Past Medical History  Diagnosis Date  . Environmental allergies   . Hypertension   . Hyperlipidemia   . 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 CAD (coronary artery disease) 2002    stent placement  . Macular degeneration   . Sleep apnea   . Esophageal reflux   . Sciatica   . Depression     Past Surgical History  Procedure Laterality Date  . Angioplasty  2002    stent placement  . Elbow surgery  1998    left  . Back surgery  1998, 2002, 2005    lumbar fusion with hardware    History  Smoking status  . Former Smoker  . Types: Cigarettes  . Quit date: 05/21/1978  Smokeless tobacco  . Never Used    History  Alcohol Use  . 0.6 oz/week  . 1 Glasses of wine per week    Comment: occasional    Family History  Problem Relation Age of Onset  . Colon  cancer Neg Hx   . Stomach cancer Neg Hx   . Heart disease Brother   . Lung cancer Brother     Reviw of Systems:  Reviewed in the HPI.  All other systems are negative.  Physical Exam: Blood pressure 118/82, pulse 88, height 5\' 9"  (1.753 m), weight 210 lb 12.8 oz (95.618 kg). General: Well developed, well nourished, in no acute distress.  Head: Normocephalic, atraumatic, sclera non-icteric, mucus membranes are moist,   Neck: Supple. Carotids are 2 + without bruits. No JVD   Lungs: Clear   Heart: RR, normal S1, S2.   Abdomen: Soft, non-tender, non-distended with normal bowel sounds.  Msk:  Strength and tone are normal   Extremities: No clubbing or cyanosis. No edema.  Distal pedal pulses are 2+ and equal    Neuro: CN II - XII intact.  Alert and oriented X 3.   Psych:  Normal   ECG:   Assessment / Plan:

## 2012-09-12 NOTE — Assessment & Plan Note (Signed)
Mark [name] Presents today for followup visit. He recently in the hospital for chest pain. History of coronary artery disease with a stent to his LAD.   He ruled out for myocardial infarction. He had a stress Myoview study that was normal.  He's done quite well since his hospitalization. He was started on isosorbide.   he still getting used to the isosorbide he has headaches in the morning.  I would like to start him on metoprolol 25 mg a day since he has a history of coronary artery disease. We'll discontinue the isosorbide. He had not been started on a beta blocker in the past that his blood pressure was borderline low. I would like for him to measure his blood pressure are regular basis. His blood pressure starts trending toward the low side, we will cut his Diovan or his HCTZ in half.  I will see him again in 6 months.   I'll see him again in 6 months for followup office visit and fasting labs.

## 2012-09-12 NOTE — Patient Instructions (Addendum)
Your physician wants you to follow-up in: 6 months  You will receive a reminder letter in the mail two months in advance. If you don't receive a letter, please call our office to schedule the follow-up appointment.  Your physician has recommended you make the following change in your medication:  STOP ISOSORBIDE START METOPROLOL TARTRATE 25 MG TWICE DAILY 12 HOURS APART  Your physician recommends that you return for a FASTING lipid profile: 6 MONTHS

## 2012-09-14 DIAGNOSIS — T24039A Burn of unspecified degree of unspecified lower leg, initial encounter: Secondary | ICD-10-CM | POA: Diagnosis not present

## 2012-10-10 ENCOUNTER — Other Ambulatory Visit: Payer: Self-pay | Admitting: *Deleted

## 2012-10-10 MED ORDER — POTASSIUM CHLORIDE CRYS ER 20 MEQ PO TBCR: 20.0000 meq | EXTENDED_RELEASE_TABLET | Freq: Two times a day (BID) | ORAL | Status: DC

## 2012-10-10 NOTE — Telephone Encounter (Signed)
Fax Received. Refill Completed. Mark [name] (R.M.A)   

## 2012-11-13 ENCOUNTER — Other Ambulatory Visit: Payer: Self-pay

## 2012-12-08 DIAGNOSIS — B370 Candidal stomatitis: Secondary | ICD-10-CM | POA: Diagnosis not present

## 2012-12-23 ENCOUNTER — Other Ambulatory Visit: Payer: Self-pay

## 2012-12-23 MED ORDER — SIMVASTATIN 40 MG PO TABS: 40.0000 mg | ORAL_TABLET | Freq: Every day | ORAL | Status: DC

## 2012-12-23 MED ORDER — NIACIN ER (ANTIHYPERLIPIDEMIC) 1000 MG PO TBCR: 1000.0000 mg | EXTENDED_RELEASE_TABLET | Freq: Every day | ORAL | Status: DC

## 2013-02-12 DIAGNOSIS — Z23 Encounter for immunization: Secondary | ICD-10-CM | POA: Diagnosis not present

## 2013-02-13 ENCOUNTER — Other Ambulatory Visit: Payer: Self-pay

## 2013-02-19 DIAGNOSIS — M5430 Sciatica, unspecified side: Secondary | ICD-10-CM | POA: Diagnosis not present

## 2013-02-19 DIAGNOSIS — M545 Low back pain, unspecified: Secondary | ICD-10-CM | POA: Diagnosis not present

## 2013-03-19 ENCOUNTER — Encounter: Payer: Self-pay | Admitting: Cardiovascular Disease

## 2013-03-19 ENCOUNTER — Ambulatory Visit (INDEPENDENT_AMBULATORY_CARE_PROVIDER_SITE_OTHER): Payer: Medicare Other | Admitting: Cardiovascular Disease

## 2013-03-19 ENCOUNTER — Other Ambulatory Visit: Payer: Medicare Other

## 2013-03-19 VITALS — BP 122/80 | HR 60 | Ht 69.0 in | Wt 217.8 lb

## 2013-03-19 DIAGNOSIS — I2510 Atherosclerotic heart disease of native coronary artery without angina pectoris: Secondary | ICD-10-CM

## 2013-03-19 DIAGNOSIS — I10 Essential (primary) hypertension: Secondary | ICD-10-CM | POA: Diagnosis not present

## 2013-03-19 DIAGNOSIS — E785 Hyperlipidemia, unspecified: Secondary | ICD-10-CM | POA: Diagnosis not present

## 2013-03-19 LAB — BASIC METABOLIC PANEL
CO2: 29 mEq/L (ref 19–32)
Calcium: 8.4 mg/dL (ref 8.4–10.5)
Glucose, Bld: 92 mg/dL (ref 70–99)
Sodium: 138 mEq/L (ref 135–145)

## 2013-03-19 LAB — LIPID PANEL
HDL: 37.9 mg/dL — ABNORMAL LOW (ref >39.00)
LDL Cholesterol: 55 mg/dL (ref 0–99)
Total CHOL/HDL Ratio: 3

## 2013-03-19 LAB — HEPATIC FUNCTION PANEL
ALT: 22 U/L (ref 0–53)
AST: 20 U/L (ref 0–37)
Total Bilirubin: 1.1 mg/dL (ref 0.3–1.2)

## 2013-03-19 NOTE — Patient Instructions (Signed)
Your physician recommends that you return for lab work in: today bmet lipid liver  Your physician recommends that you continue on your current medications as directed. Please refer to the Current Medication list given to you today.   Your physician wants you to follow-up in: 1 year  You will receive a reminder letter in the mail two months in advance. If you don't receive a letter, please call our office to schedule the follow-up appointment.   

## 2013-03-19 NOTE — Assessment & Plan Note (Signed)
Mark [name] is doing very well. He's not having any episodes of angina. We'll check fasting labs today including lipid profile, liver enzymes, and basic metabolic profile.  We discussed the recent study that suggested that patients who've had a stent do better on Plavix and aspirin. We had long discussion in any and he decided that he wanted to stay with aspirin alone since he's not had any clinical events. We also discussed the possibility of decreasing his aspirin 81 mg daily but he would like to stay at 325.  Seen again in one year. We'll check an EKG and fasting labs at that time.

## 2013-03-19 NOTE — Assessment & Plan Note (Signed)
We'll check fasting lipids today. 

## 2013-03-19 NOTE — Progress Notes (Signed)
[facility] Loleta Rose Date of Birth  Aug 13, 1940       [facility] Office [address]  [address] [place], [place]  [address]   [place], [place]  [address] [phone]     [phone]   Fax  [phone]    Fax [phone]  Problem List: 1. CAD - PCI to LAD 2. Hypertension 3.   History of Present Illness:  Mark [name] was recently admitted to the hospital with some CP.  Stress myoview was negative.   He is back doing his normal activities.   He swims regularly ( 3 times a week)    Dec. 10, 2014:  He has had some back pain  / sciatica.  No angina.   Able to do all of his normal activities without problems.   He is retired from [employer].     Current Outpatient Prescriptions on File Prior to Visit  Medication Sig Dispense Refill  . aspirin 325 MG tablet Take 325 mg by mouth every evening.      . Calcium Citrate-Vitamin D 250-200 MG-UNIT TABS Take 1 tablet by mouth daily.      Marland Kitchen docusate sodium (COLACE) 100 MG capsule Take 100 mg by mouth daily.      . hydrochlorothiazide (HYDRODIURIL) 25 MG tablet Take 25 mg by mouth daily.      . metoprolol tartrate (LOPRESSOR) 25 MG tablet Take 1 tablet (25 mg total) by mouth 2 (two) times daily.  180 tablet  3  . niacin (NIASPAN) 1000 MG CR tablet Take 1 tablet (1,000 mg total) by mouth at bedtime.  30 tablet  6  . nitroGLYCERIN (NITROSTAT) 0.4 MG SL tablet Place 0.4 mg under the tongue every 5 (five) minutes as needed for chest pain.      . Omega-3 Fatty Acids (FISH OIL) 1200 MG CAPS Take 2,400 mg by mouth 2 (two) times daily.      . polycarbophil (FIBERCON) 625 MG tablet Take 1,250 mg by mouth 2 (two) times daily.       . potassium chloride SA (K-DUR,KLOR-CON) 20 MEQ tablet Take 1 tablet (20 mEq total) by mouth 2 (two) times daily.  180 tablet  3  . testosterone (ANDROGEL) 50 MG/5GM GEL Place 5 g onto the skin daily.      . valsartan (DIOVAN) 320 MG tablet Take 160 mg by mouth daily.       No  current facility-administered medications on file prior to visit.    Allergies  Allergen Reactions  . Codeine Itching  . Percocet [Oxycodone-Acetaminophen] Itching    Past Medical History  Diagnosis Date  . Environmental allergies   . Hypertension   . Hyperlipidemia   . 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 CAD (coronary artery disease) 2002    stent placement  . Macular degeneration   . Sleep apnea   . Esophageal reflux   . Sciatica   . Depression     Past Surgical History  Procedure Laterality Date  . Angioplasty  2002    stent placement  . Elbow surgery  1998    left  . Back surgery  1998, 2002, 2005    lumbar fusion with hardware    History  Smoking status  . Former Smoker  . Types: Cigarettes  . Quit date: 05/21/1978  Smokeless tobacco  . Never Used    History  Alcohol Use  . 0.6  oz/week  . 1 Glasses of wine per week    Comment: occasional    Family History  Problem Relation Age of Onset  . Colon cancer Neg Hx   . Stomach cancer Neg Hx   . Heart disease Brother   . Lung cancer Brother     Reviw of Systems:  Reviewed in the HPI.  All other systems are negative.  Physical Exam: Blood pressure 122/80, pulse 60, height 5\' 9"  (1.753 m), weight 217 lb 12.8 oz (98.793 kg). General: Well developed, well nourished, in no acute distress.  Head: Normocephalic, atraumatic, sclera non-icteric, mucus membranes are moist,   Neck: Supple. Carotids are 2 + without bruits. No JVD   Lungs: Clear   Heart: RR, normal S1, S2.   Abdomen: Soft, non-tender, non-distended with normal bowel sounds.  Msk:  Strength and tone are normal   Extremities: No clubbing or cyanosis. No edema.  Distal pedal pulses are 2+ and equal    Neuro: CN II - XII intact.  Alert and oriented X 3.   Psych:  Normal   ECG:  Assessment / Plan:

## 2013-04-16 DIAGNOSIS — E291 Testicular hypofunction: Secondary | ICD-10-CM | POA: Diagnosis not present

## 2013-04-16 DIAGNOSIS — F3289 Other specified depressive episodes: Secondary | ICD-10-CM | POA: Diagnosis not present

## 2013-04-16 DIAGNOSIS — R972 Elevated prostate specific antigen [PSA]: Secondary | ICD-10-CM | POA: Diagnosis not present

## 2013-04-16 DIAGNOSIS — I10 Essential (primary) hypertension: Secondary | ICD-10-CM | POA: Diagnosis not present

## 2013-04-16 DIAGNOSIS — F329 Major depressive disorder, single episode, unspecified: Secondary | ICD-10-CM | POA: Diagnosis not present

## 2013-04-16 DIAGNOSIS — E785 Hyperlipidemia, unspecified: Secondary | ICD-10-CM | POA: Diagnosis not present

## 2013-04-21 ENCOUNTER — Encounter: Payer: Self-pay | Admitting: Cardiovascular Disease

## 2013-04-21 ENCOUNTER — Telehealth: Payer: Self-pay | Admitting: Cardiovascular Disease

## 2013-04-21 ENCOUNTER — Encounter: Payer: Self-pay | Admitting: *Deleted

## 2013-04-21 NOTE — Telephone Encounter (Signed)
Pt was given mychart number to assist him, pt accepting of plan.

## 2013-04-21 NOTE — Telephone Encounter (Signed)
New Problem:  Pt is requesting a call back to help him activate his MyChart... Needs activation code.Marland KitchenMarland KitchenMarland Kitchen

## 2013-05-12 ENCOUNTER — Other Ambulatory Visit: Payer: Self-pay | Admitting: Cardiovascular Disease

## 2013-05-19 ENCOUNTER — Other Ambulatory Visit: Payer: Self-pay | Admitting: *Deleted

## 2013-05-19 MED ORDER — HYDROCHLOROTHIAZIDE 25 MG PO TABS: 25.0000 mg | ORAL_TABLET | Freq: Every day | ORAL | Status: DC

## 2013-05-20 DIAGNOSIS — R972 Elevated prostate specific antigen [PSA]: Secondary | ICD-10-CM | POA: Diagnosis not present

## 2013-05-20 DIAGNOSIS — N401 Enlarged prostate with lower urinary tract symptoms: Secondary | ICD-10-CM | POA: Diagnosis not present

## 2013-05-20 DIAGNOSIS — E291 Testicular hypofunction: Secondary | ICD-10-CM | POA: Diagnosis not present

## 2013-05-20 DIAGNOSIS — N139 Obstructive and reflux uropathy, unspecified: Secondary | ICD-10-CM | POA: Diagnosis not present

## 2013-06-17 DIAGNOSIS — R972 Elevated prostate specific antigen [PSA]: Secondary | ICD-10-CM | POA: Diagnosis not present

## 2013-06-17 DIAGNOSIS — C61 Malignant neoplasm of prostate: Secondary | ICD-10-CM | POA: Diagnosis not present

## 2013-06-17 HISTORY — DX: Malignant neoplasm of prostate: C61

## 2013-06-17 HISTORY — PX: PROSTATE BIOPSY: SHX241

## 2013-06-24 DIAGNOSIS — C61 Malignant neoplasm of prostate: Secondary | ICD-10-CM | POA: Diagnosis not present

## 2013-06-24 DIAGNOSIS — N401 Enlarged prostate with lower urinary tract symptoms: Secondary | ICD-10-CM | POA: Diagnosis not present

## 2013-06-24 DIAGNOSIS — N139 Obstructive and reflux uropathy, unspecified: Secondary | ICD-10-CM | POA: Diagnosis not present

## 2013-07-02 DIAGNOSIS — H353190 Nonexudative age-related macular degeneration, unspecified eye, stage unspecified: Secondary | ICD-10-CM | POA: Diagnosis not present

## 2013-07-02 DIAGNOSIS — H2510 Age-related nuclear cataract, unspecified eye: Secondary | ICD-10-CM | POA: Diagnosis not present

## 2013-07-31 DIAGNOSIS — J40 Bronchitis, not specified as acute or chronic: Secondary | ICD-10-CM | POA: Diagnosis not present

## 2013-09-02 DIAGNOSIS — R5381 Other malaise: Secondary | ICD-10-CM | POA: Diagnosis not present

## 2013-09-02 DIAGNOSIS — I10 Essential (primary) hypertension: Secondary | ICD-10-CM | POA: Diagnosis not present

## 2013-09-29 ENCOUNTER — Other Ambulatory Visit: Payer: Self-pay | Admitting: Cardiovascular Disease

## 2013-09-29 DIAGNOSIS — C61 Malignant neoplasm of prostate: Secondary | ICD-10-CM | POA: Diagnosis not present

## 2013-10-07 DIAGNOSIS — R972 Elevated prostate specific antigen [PSA]: Secondary | ICD-10-CM | POA: Diagnosis not present

## 2013-10-07 DIAGNOSIS — E291 Testicular hypofunction: Secondary | ICD-10-CM | POA: Diagnosis not present

## 2013-10-07 DIAGNOSIS — N401 Enlarged prostate with lower urinary tract symptoms: Secondary | ICD-10-CM | POA: Diagnosis not present

## 2013-10-07 DIAGNOSIS — C61 Malignant neoplasm of prostate: Secondary | ICD-10-CM | POA: Diagnosis not present

## 2013-10-08 ENCOUNTER — Other Ambulatory Visit: Payer: Self-pay | Admitting: Urology

## 2013-10-08 DIAGNOSIS — R972 Elevated prostate specific antigen [PSA]: Secondary | ICD-10-CM

## 2013-10-08 DIAGNOSIS — C61 Malignant neoplasm of prostate: Secondary | ICD-10-CM

## 2013-10-22 ENCOUNTER — Ambulatory Visit (HOSPITAL_COMMUNITY)
Admission: RE | Admit: 2013-10-22 | Discharge: 2013-10-22 | Disposition: A | Discharge status: Home / Self Care | Payer: Medicare Other | Source: Ambulatory Visit | Attending: Urology | Admitting: Urology

## 2013-10-22 DIAGNOSIS — N508 Other specified disorders of male genital organs: Secondary | ICD-10-CM | POA: Diagnosis not present

## 2013-10-22 DIAGNOSIS — C61 Malignant neoplasm of prostate: Secondary | ICD-10-CM | POA: Insufficient documentation

## 2013-10-22 DIAGNOSIS — N402 Nodular prostate without lower urinary tract symptoms: Secondary | ICD-10-CM | POA: Diagnosis not present

## 2013-10-22 DIAGNOSIS — R972 Elevated prostate specific antigen [PSA]: Secondary | ICD-10-CM

## 2013-10-22 LAB — POCT I-STAT CREATININE: Creatinine, Ser: 1.2 mg/dL (ref 0.50–1.35)

## 2013-10-22 MED ORDER — GADOBENATE DIMEGLUMINE 529 MG/ML IV SOLN
20.0000 mL | Freq: Once | INTRAVENOUS | Status: AC | PRN
  Administered 2013-10-22: 20 mL via INTRAVENOUS

## 2013-11-05 DIAGNOSIS — C61 Malignant neoplasm of prostate: Secondary | ICD-10-CM | POA: Diagnosis not present

## 2013-11-05 DIAGNOSIS — R972 Elevated prostate specific antigen [PSA]: Secondary | ICD-10-CM | POA: Diagnosis not present

## 2013-11-11 DIAGNOSIS — C61 Malignant neoplasm of prostate: Secondary | ICD-10-CM | POA: Diagnosis not present

## 2013-11-11 DIAGNOSIS — E291 Testicular hypofunction: Secondary | ICD-10-CM | POA: Diagnosis not present

## 2013-11-25 ENCOUNTER — Encounter: Payer: Self-pay | Admitting: Radiation Oncology

## 2013-11-25 NOTE — Progress Notes (Signed)
GU Location of Tumor / Histology: prostate adenocarcinoma  If Prostate Cancer, Gleason Score is ( 3+4 ) and PSA is (7.53 on 09/29/13) 04/2013  PSA 5.57 01/09/2005 PSA  1.14 11/24/2002 PSA 1.06  Berton Lan presented 5 months ago with signs/symptoms of: elevated PSA, urinary freq, intermittent flow, hesitancy, nocturia, ED, positive prostate biopsy on 06/17/13  Biopsies of prostate (if applicable) revealed:  8/92/11   Volume 57.52 cc   Past/Anticipated interventions by urology, if any: biopsy  10/22/13 MRI- prostate- 1.5 cm nodule in right anterior mid gland and apex  Past/Anticipated interventions by medical oncology, if any: none  Weight changes, if any: no  Bowel/Bladder complaints, if any:  incomplete emptying, intermittent stream, urgency, nocturia x 2, IPSS 13  Nausea/Vomiting, if any: no  Pain issues, if any:  Chronic low back pain, s/p back surgery x 3  SAFETY ISSUES:  Prior radiation? no  Pacemaker/ICD? no  Possible current pregnancy? na  Is the patient on methotrexate? no  Current Complaints / other details:  Married, retired from [employer]

## 2013-11-26 ENCOUNTER — Ambulatory Visit
Admission: RE | Admit: 2013-11-26 | Discharge: 2013-11-26 | Disposition: A | Discharge status: Home / Self Care | Payer: Medicare Other | Source: Ambulatory Visit | Attending: Radiation Oncology | Admitting: Radiation Oncology

## 2013-11-26 ENCOUNTER — Encounter: Payer: Self-pay | Admitting: Radiation Oncology

## 2013-11-26 VITALS — BP 136/83 | HR 51 | Temp 97.5°F | Resp 20 | Ht 69.0 in | Wt 218.4 lb

## 2013-11-26 DIAGNOSIS — I2510 Atherosclerotic heart disease of native coronary artery without angina pectoris: Secondary | ICD-10-CM | POA: Diagnosis not present

## 2013-11-26 DIAGNOSIS — Z510 Encounter for antineoplastic radiation therapy: Secondary | ICD-10-CM | POA: Insufficient documentation

## 2013-11-26 DIAGNOSIS — Z9861 Coronary angioplasty status: Secondary | ICD-10-CM | POA: Diagnosis not present

## 2013-11-26 DIAGNOSIS — I10 Essential (primary) hypertension: Secondary | ICD-10-CM | POA: Insufficient documentation

## 2013-11-26 DIAGNOSIS — C61 Malignant neoplasm of prostate: Secondary | ICD-10-CM | POA: Insufficient documentation

## 2013-11-26 DIAGNOSIS — Z87891 Personal history of nicotine dependence: Secondary | ICD-10-CM | POA: Insufficient documentation

## 2013-11-26 DIAGNOSIS — Z7982 Long term (current) use of aspirin: Secondary | ICD-10-CM | POA: Insufficient documentation

## 2013-11-26 DIAGNOSIS — E785 Hyperlipidemia, unspecified: Secondary | ICD-10-CM | POA: Diagnosis not present

## 2013-11-26 DIAGNOSIS — Z791 Long term (current) use of non-steroidal anti-inflammatories (NSAID): Secondary | ICD-10-CM | POA: Insufficient documentation

## 2013-11-26 HISTORY — DX: Unspecified osteoarthritis, unspecified site: M19.90

## 2013-11-26 HISTORY — DX: Malignant neoplasm of prostate: C61

## 2013-11-26 NOTE — Progress Notes (Addendum)
[facility] NEW PATIENT EVALUATION  Name: Mark [name] MRN: [id number]  Date:   11/26/2013           DOB: 13-Sep-1940  Status: outpatient   CC: Shirline Frees, MD  Raynelle Bring, MD , Dr. 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 male who is seen today through the courtesy of Dr. Alinda Money and Dr. Gaynelle Arabian for evaluation of his stage T1c  intermediate risk adenocarcinoma prostate. He was noted to have a PSA of 5.57 back in January 2015. Ultrasound-guided biopsies by Dr. Gaynelle Arabian on 06/17/2013 revealed Gleason 7 (3+4) involving 20% of one core from the right apex. 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 His gland volume was 57.5 cc. Of note is that he was on  testosterone supplementation for one year and this was discontinued at the time of his diagnosis in March. He elected for close observation and a followup PSA on 09/29/2013 rose to 7.53 despite coming off testosterone supplementation 3 months earlier. Dr. Gaynelle Arabian obtained a MR of the prostate on 10/22/2013 which showed a 1.5 cm ill-defined nodule in the right anterior mid gland and apex involving both the peripheral and central zones, suspicious for high-grade carcinoma. There was no evidence for extracapsular extension. He was seen for a second opinion by Dr. Alinda Money. He is doing reasonably well from a GU and GI standpoint. His I PSS score is 13. His medical comorbidities include coronary artery disease, having had a cardiac stent placed in 2002. He is on aspirin.  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     PAST SURGICAL HISTORY:  Past Surgical History  Procedure Laterality Date  . Angioplasty  2002    stent placement  . Elbow surgery  1998    left  . Back surgery  1998, 2002, 2005    lumbar fusion with hardware  . Prostate biopsy  06/17/13    gleason 7     FAMILY HISTORY: family history includes COPD in his mother; Heart disease in his brother; Hypertension in his brother and sister; Lung cancer in his brother and father. There is no history of Colon cancer or Stomach cancer. His father died of lung cancer at 28. His mother died from complications of COPD and congestive heart failure at 20. No family history of prostate cancer.   SOCIAL HISTORY:  reports that he quit smoking about 35 years ago. His smoking use included Cigarettes. He has a 10 pack-year smoking history. He has never used smokeless tobacco. He reports that he drinks about .6 ounces of alcohol per week. He reports that he does not use illicit drugs.  Married, sons ages 44 and 32. 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   ALLERGIES: Codeine and Percocet   MEDICATIONS:  Current Outpatient Prescriptions  Medication Sig Dispense Refill  . aspirin 325 MG tablet Take 325 mg by mouth every evening.      Marland Kitchen atorvastatin (LIPITOR) 80 MG tablet Take 80 mg by mouth daily.      . Calcium Citrate-Vitamin D 250-200 MG-UNIT TABS Take 1 tablet by mouth daily.      Marland Kitchen docusate sodium (  COLACE) 100 MG capsule Take 100 mg by mouth daily.      . hydrochlorothiazide (HYDRODIURIL) 25 MG tablet Take 1 tablet (25 mg total) by mouth daily.  90 tablet  2  . HYDROcodone-acetaminophen (NORCO/VICODIN) 5-325 MG per tablet Take by mouth daily. Patient is no sure what mg  Patient is taking as needed for pain      . metoprolol tartrate (LOPRESSOR) 25 MG tablet TAKE 1 TABLET TWICE A DAY  180 tablet  2  . Multiple Vitamins-Minerals (EYE VITAMINS) CAPS Take by mouth.      . naproxen (NAPROSYN) 250 MG tablet Take  by mouth daily.      . nitroGLYCERIN (NITROSTAT) 0.4 MG SL tablet Place 0.4 mg under the tongue every 5 (five) minutes as needed for chest pain.      . Omega-3 Fatty Acids (FISH OIL) 1200 MG CAPS Take 2,400 mg by mouth 2 (two) times daily.      . polycarbophil (FIBERCON) 625 MG tablet Take 1,250 mg by mouth 2 (two) times daily.       . potassium chloride SA (K-DUR,KLOR-CON) 20 MEQ tablet Take 1 tablet (20 mEq total) by mouth 2 (two) times daily.  180 tablet  3  . testosterone (ANDROGEL) 50 MG/5GM GEL Place 5 g onto the skin daily.      . valsartan (DIOVAN) 320 MG tablet Take 160 mg by mouth daily.       No current facility-administered medications for this encounter.     REVIEW OF SYSTEMS:  Pertinent items are noted in HPI.    PHYSICAL EXAM:  height is 5\' 9"  (1.753 m) and weight is 218 lb 6.4 oz (99.066 kg). His oral temperature is 97.5 F (36.4 C). His blood pressure is 136/83 and his pulse is 51. His respiration is 20.   Alert and oriented 73 year old white male appearing his stated age. Head and neck examination: Grossly unremarkable. Nodes: Without palpable cervical or supraclavicular lymphadenopathy. Chest: Lungs clear. Abdomen: Without masses organomegaly. Genitalia: Unremarkable to inspection. Rectal: His gland is slightly enlarged and is without focal induration or nodularity.   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 I explained to the patient and his wife that his prognosis is related to his stage, PSA level, and Gleason  score. His stage and PSA are favorable while his Gleason 7 is of intermediate favorability. Of some concern is that his PSA rose to 7.53 despite coming off androgen supplementation. His MRI scan shows restricted diffusion and hypervascularity on dynamic imaging,  highly suspicious for high-grade carcinoma. His management options include surgery versus continued close surveillance versus radiation therapy. I would favor curative therapy at this time. We discussed the potential acute and late toxicities of radiation therapy. He is not an ideal candidate for seed implantation alone based on possible high-grade disease, moderate obstructive symptomatology, and slight prostatic enlargement. If he wants to pursue radiation therapy, I feel that he  would be better treated with external beam/IMRT. We discussed treatment with a comfortably full bladder to minimize urinary related toxicity. He will now go home and think things over. He can contact me if he would like to proceed with radiation therapy.   PLAN: As discussed above.   I spent 60 minutes minutes face to face with the patient and more than 50% of that time was spent in counseling and/or coordination of care.

## 2013-12-05 ENCOUNTER — Encounter: Payer: Self-pay | Admitting: *Deleted

## 2013-12-05 NOTE — Progress Notes (Signed)
[place] Psychosocial Distress Screening Clinical Social Work  Clinical Social Work was referred by distress screening protocol.  The patient scored a 7 on the Psychosocial Distress Thermometer which indicates moderate distress. Clinical Social Worker phoned pt to assess for distress and other psychosocial needs. CSW left vm for pt to return call. Pt currently does not have future appointments set up at this time.   ONCBCN DISTRESS SCREENING 11/26/2013  Screening Type Initial Screening  Mark the number that describes how much distress you have been experiencing in the past week 7  Practical problem type Insurance  Family Problem type Other (comment)  Emotional problem type Nervousness/Anxiety;Adjusting to illness  Spiritual/Religous concerns type Loss of sense of purpose  Information Concerns Type Lack of info about treatment  Physical Problem type Pain;Constipation/diarrhea  Other (No Data)     Clinical Social Worker follow up needed: No.  If yes, follow up plan: Loren Racer, [place] Worker [facility] [place] [facility] [place] Wednesday, Thursday and Friday Phone: [phone] Fax: [phone]

## 2013-12-08 NOTE — Addendum Note (Signed)
Encounter addended by: Rexene Edison, MD on: 12/08/2013  6:50 PM<BR>     Documentation filed: Notes Section

## 2013-12-18 ENCOUNTER — Encounter: Payer: Self-pay | Admitting: *Deleted

## 2013-12-18 NOTE — Progress Notes (Signed)
Mark [name]  Clinical Social [name] was referred by patient for assessment of psychosocial needs.  Patient returned call and was eager to discuss concerns with CSW. CSW provided supportive listening to offer support and assess for needs.  Pt continues to consider his treatment options and plans to talk with providers at the [place]. CSW discussed emotional coping as well and he is very interested in attending the prostate support group in a few weeks.   Clinical Social [name] interventions: Emotional support Resource education  Mark [name], [place] Worker [place]  [place] Phone: [phone] Fax: [phone]

## 2013-12-23 DIAGNOSIS — C61 Malignant neoplasm of prostate: Secondary | ICD-10-CM | POA: Diagnosis not present

## 2014-01-01 ENCOUNTER — Other Ambulatory Visit: Payer: Self-pay | Admitting: Urology

## 2014-01-01 DIAGNOSIS — H40019 Open angle with borderline findings, low risk, unspecified eye: Secondary | ICD-10-CM | POA: Diagnosis not present

## 2014-01-01 DIAGNOSIS — H353190 Nonexudative age-related macular degeneration, unspecified eye, stage unspecified: Secondary | ICD-10-CM | POA: Diagnosis not present

## 2014-01-01 DIAGNOSIS — H2510 Age-related nuclear cataract, unspecified eye: Secondary | ICD-10-CM | POA: Diagnosis not present

## 2014-01-07 ENCOUNTER — Telehealth: Payer: Self-pay | Admitting: Cardiovascular Disease

## 2014-01-07 NOTE — Telephone Encounter (Signed)
Received request from Nurse fax box, documents faxed for surgical clearance. To: [facility] Fax number: [phone] Attention: 9.30.15/km

## 2014-01-13 DIAGNOSIS — C61 Malignant neoplasm of prostate: Secondary | ICD-10-CM | POA: Diagnosis not present

## 2014-01-16 ENCOUNTER — Encounter (HOSPITAL_COMMUNITY): Payer: Self-pay | Admitting: Pharmacy Technician

## 2014-01-20 ENCOUNTER — Ambulatory Visit (INDEPENDENT_AMBULATORY_CARE_PROVIDER_SITE_OTHER): Payer: Medicare Other | Admitting: Cardiovascular Disease

## 2014-01-20 ENCOUNTER — Encounter: Payer: Self-pay | Admitting: Cardiovascular Disease

## 2014-01-20 ENCOUNTER — Encounter: Payer: Self-pay | Admitting: *Deleted

## 2014-01-20 VITALS — BP 106/76 | HR 61 | Ht 69.0 in | Wt 218.2 lb

## 2014-01-20 DIAGNOSIS — R278 Other lack of coordination: Secondary | ICD-10-CM | POA: Diagnosis not present

## 2014-01-20 DIAGNOSIS — R079 Chest pain, unspecified: Secondary | ICD-10-CM

## 2014-01-20 DIAGNOSIS — M6281 Muscle weakness (generalized): Secondary | ICD-10-CM | POA: Diagnosis not present

## 2014-01-20 DIAGNOSIS — I2510 Atherosclerotic heart disease of native coronary artery without angina pectoris: Secondary | ICD-10-CM

## 2014-01-20 DIAGNOSIS — C61 Malignant neoplasm of prostate: Secondary | ICD-10-CM | POA: Diagnosis not present

## 2014-01-20 NOTE — Patient Instructions (Signed)
Your physician recommends that you continue on your current medications as directed. Please refer to the Current Medication list given to you today.   Your physician wants you to follow-up in: [place] will receive a reminder letter in the mail two months in advance. If you don't receive a letter, please call our office to schedule the follow-up appointment.  YOU HAVE BEEN CLEARED FROM A CARDIAC STANDPOINT BY DR NAHSER- YOU MAY PROCEED WITH YOUR SCHEDULED PROSTATE SURGERY  You may hold your ASPIRIN 7 DAYS PRIOR TO YOUR SURGERY

## 2014-01-20 NOTE — Progress Notes (Signed)
Berton Lan Date of Birth  01-13-1941       [facility] Office [address], [address], [place], [place]  [address]   [place], [place]  [address] [phone]     [phone]   Fax  [phone]    Fax [phone]  Problem List: 1. CAD - PCI to LAD - 2002 Linard Millers)  2. Hypertension 3.   History of Present Illness:  Marden Noble was recently admitted to the hospital with some CP.  Stress myoview was negative.   He is back doing his normal activities.   He swims regularly ( 3 times a week)    Dec. 10, 2014:  He has had some back pain  / sciatica.  No angina.   Able to do all of his normal activities without problems.   He is retired from [employer].    Oct. 13, 2015:  Jamarius is doing well.  No CP,  On ASA alone  He was diagnosed with prostate cancer in March 2015.     He is off Testosterone supplement.   Now needs surgery.   He has elected to go with the [facility] robotic surgery.    He is splitting his metoprolol in half and taking twice a day.   He swims on a regular basis-perhaps 3 times a week. He swims for about 30-45 minutes and probably as much as one mild each day that he swims. He has no trouble clubbing 1-2 flights of stairs. He is somewhat limited by his left ankle injury.  Had a stress Myoview study last year which showed:  IMPRESSION:  1. No scintigraphic evidence of prior infarction or  pharmacologically induced ischemia.  2. Normal wall motion. Ejection fraction - 71%.      Current Outpatient Prescriptions on File Prior to Visit  Medication Sig Dispense Refill  . aspirin 325 MG tablet Take 325 mg by mouth every evening.      Marland Kitchen atorvastatin (LIPITOR) 80 MG tablet Take 80 mg by mouth at bedtime. 1/2 TAB DAILY      . Calcium Citrate-Vitamin D 250-200 MG-UNIT TABS Take 1 tablet by mouth every morning.       . docusate sodium (COLACE) 100 MG capsule Take 100 mg by mouth once as needed for mild constipation.        . hydrochlorothiazide (HYDRODIURIL) 25 MG tablet Take 25 mg by mouth every morning.      Marland Kitchen HYDROcodone-acetaminophen (NORCO/VICODIN) 5-325 MG per tablet Take 1 tablet by mouth once as needed for moderate pain.       . Iodoquinol-HC (HYDROCORTISONE-IODOQUINOL) 1-1 % CREA Apply 1 application topically daily as needed (rash).      . metoprolol tartrate (LOPRESSOR) 25 MG tablet Take 12.5 mg by mouth 2 (two) times daily. HALF IN THE AM  HALF IN THE PM      . naproxen (NAPROSYN) 500 MG tablet Take 500 mg by mouth 2 (two) times daily.      . nitroGLYCERIN (NITROSTAT) 0.4 MG SL tablet Place 0.4 mg under the tongue every 5 (five) minutes as needed for chest pain.      . Omega-3 Fatty Acids (FISH OIL) 1200 MG CAPS Take 2,400 mg by mouth every morning.       Marland Kitchen OVER THE COUNTER MEDICATION Take 2 tablets by mouth every morning. [facility] brand allergy medicine.      . polycarbophil (FIBERCON) 625  MG tablet Take 1,250 mg by mouth 2 (two) times daily.       . potassium chloride SA (K-DUR,KLOR-CON) 20 MEQ tablet Take 40 mEq by mouth every morning.      . Tetrahydrozoline HCl (VISINE OP) Place 1 drop into both eyes once as needed (dry eyes.).      Marland Kitchen valsartan (DIOVAN) 160 MG tablet Take 160 mg by mouth every morning.       No current facility-administered medications on file prior to visit.  also taking Metoprolol 12.5 BID   Allergies  Allergen Reactions  . Codeine Itching  . Percocet [Oxycodone-Acetaminophen] Itching    Past Medical History  Diagnosis Date  . Environmental allergies   . Hypertension   . Hyperlipidemia   . 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 CAD (coronary artery disease) 2002    stent placement  . Macular degeneration   . Sleep apnea   . Esophageal reflux   . Sciatica   . Depression   . Arthritis   . Prostate cancer 06/17/13    gleason 3+4=7, vol 57.52 cc    Past Surgical History  Procedure Laterality Date  . Angioplasty  2002    stent placement  . Elbow surgery   1998    left  . Back surgery  1998, 2002, 2005    lumbar fusion with hardware  . Prostate biopsy  06/17/13    gleason 7    History  Smoking status  . Former Smoker -- 0.50 packs/day for 20 years  . Types: Cigarettes  . Quit date: 05/21/1978  Smokeless tobacco  . Never Used    History  Alcohol Use  . 0.6 oz/week  . 1 Glasses of wine per week    Comment: occasional    Family History  Problem Relation Age of Onset  . Colon cancer Neg Hx   . Stomach cancer Neg Hx   . Heart disease Brother   . Lung cancer Brother   . Hypertension Brother   . COPD Mother   . Lung cancer Father   . Hypertension Sister     Reviw of Systems:  Reviewed in the HPI.  All other systems are negative.  Physical Exam: Blood pressure 106/76, pulse 61, height 5\' 9"  (1.753 m), weight 218 lb 3.2 oz (98.975 kg). General: Well developed, well nourished, in no acute distress.  Head: Normocephalic, atraumatic, sclera non-icteric, mucus membranes are moist,   Neck: Supple. Carotids are 2 + without bruits. No JVD   Lungs: Clear   Heart: RR, normal S1, S2.   Abdomen: Soft, non-tender, non-distended with normal bowel sounds.  Msk:  Strength and tone are normal   Extremities: No clubbing or cyanosis. No edema.  Distal pedal pulses are 2+ and equal    Neuro: CN II - XII intact.  Alert and oriented X 3.   Psych:  Normal   ECG: 01/20/2014: Normal sinus rhythm at 61. He has no ST or T wave changes. Assessment / Plan:

## 2014-01-20 NOTE — Assessment & Plan Note (Signed)
Mark [name] has a history of coronary artery disease. He is status post PCI of his LAD and 2002.  He's not had any symptoms. He exercises on a regular basis and never has any episodes of angina. He had a stress Myoview study last year which revealed no ischemia and revealed a normal left ventricular systolic function with an EF of 71%.    He's at low risk for his upcoming prostate surgery .  He may hold his ASA for 1 week if needed  I'll see him again in one year.

## 2014-01-23 ENCOUNTER — Other Ambulatory Visit: Payer: Self-pay

## 2014-01-29 NOTE — Patient Instructions (Addendum)
Mark [name]  01/29/2014                           YOUR PROCEDURE IS SCHEDULED ON: 02/05/14                ENTER FROM [address] -  ENTER THRU EMERGENCY ENTRANCE                                        FOLLOW  SIGNS TO SHORT STAY CENTER                 ARRIVE AT SHORT STAY AT: 5:15 AM               CALL THIS NUMBER IF ANY PROBLEMS THE DAY OF SURGERY :               [phone]                                REMEMBER:   Do not eat food or drink liquids AFTER MIDNIGHT              FOLLOW BOWEL PREP INSTRUCTIONS                  Take these medicines the morning of surgery with               A SIPS OF WATER :   METOPROLOL   / VICODINE IF NEEDED   Do not wear jewelry, make-up   Do not wear lotions, powders, or perfumes.   Do not shave legs or underarms 12 hrs. before surgery (men may shave face)  Do not bring valuables to the hospital.  Contacts, dentures or bridgework may not be worn into surgery.  Leave suitcase in the car. After surgery it may be brought to your room.  For patients admitted to the hospital more than one night, checkout time is            11:00 AM                                                         ________________________________________________________________________                                                                                                  [place]  Before surgery, you can play an important role.  Because skin is not sterile, your skin needs to be as free of germs as possible.  You can reduce the number of germs on your skin by washing with CHG (chlorahexidine gluconate) soap before surgery.  CHG is an antiseptic cleaner which kills germs and bonds with the skin to continue killing  germs even after washing. Please DO NOT use if you have an allergy to CHG or antibacterial soaps.  If your skin becomes reddened/irritated stop using the CHG and inform your nurse when you arrive at Short  Stay. Do not shave (including legs and underarms) for at least 48 hours prior to the first CHG shower.  You may shave your face. Please follow these instructions carefully:   1.  Shower with CHG Soap the night before surgery and the  morning of Surgery.   2.  If you choose to wash your hair, wash your hair first as usual with your  normal  Shampoo.   3.  After you shampoo, rinse your hair and body thoroughly to remove the  shampoo.                                         4.  Use CHG as you would any other liquid soap.  You can apply chg directly  to the skin and wash . Gently wash with scrungie or clean wascloth    5.  Apply the CHG Soap to your body ONLY FROM THE NECK DOWN.   Do not use on open                           Wound or open sores. Avoid contact with eyes, ears mouth and genitals (private parts).                        Genitals (private parts) with your normal soap.              6.  Wash thoroughly, paying special attention to the area where your surgery  will be performed.   7.  Thoroughly rinse your body with warm water from the neck down.   8.  DO NOT shower/wash with your normal soap after using and rinsing off  the CHG Soap .                9.  Pat yourself dry with a clean towel.             10.  Wear clean pajamas.             11.  Place clean sheets on your bed the night of your first shower and do not  sleep with pets.  Day of Surgery : Do not apply any lotions/deodorants the morning of surgery.  Please wear clean clothes to the hospital/surgery center.  FAILURE TO FOLLOW THESE INSTRUCTIONS MAY RESULT IN THE CANCELLATION OF YOUR SURGERY    PATIENT SIGNATURE_________________________________  ______________________________________________________________________     [name]  An incentive spirometer is a tool that can help keep your lungs clear and active. This tool measures how well you are filling your lungs with each breath. Taking long  deep breaths may help reverse or decrease the chance of developing breathing (pulmonary) problems (especially infection) following:  A long period of time when you are unable to move or be active. BEFORE THE PROCEDURE   If the spirometer includes an indicator to show your best effort, your nurse or respiratory therapist will set it to a desired goal.  If possible, sit up straight or lean slightly forward. Try not to slouch.  Hold the incentive spirometer in an upright position. INSTRUCTIONS FOR USE  1. Sit on the edge of your bed if possible, or sit up as far as you can in bed or on a chair. 2. Hold the incentive spirometer in an upright position. 3. Breathe out normally. 4. Place the mouthpiece in your mouth and seal your lips tightly around it. 5. Breathe in slowly and as deeply as possible, raising the piston or the ball toward the top of the column. 6. Hold your breath for 3-5 seconds or for as long as possible. Allow the piston or ball to fall to the bottom of the column. 7. Remove the mouthpiece from your mouth and breathe out normally. 8. Rest for a few seconds and repeat Steps 1 through 7 at least 10 times every 1-2 hours when you are awake. Take your time and take a few normal breaths between deep breaths. 9. The spirometer may include an indicator to show your best effort. Use the indicator as a goal to work toward during each repetition. 10. After each set of 10 deep breaths, practice coughing to be sure your lungs are clear. If you have an incision (the cut made at the time of surgery), support your incision when coughing by placing a pillow or rolled up towels firmly against it. Once you are able to get out of bed, walk around indoors and cough well. You may stop using the incentive spirometer when instructed by your caregiver.  RISKS AND COMPLICATIONS  Take your time so you do not get dizzy or light-headed.  If you are in pain, you may need to take or ask for pain medication  before doing incentive spirometry. It is harder to take a deep breath if you are having pain. AFTER USE  Rest and breathe slowly and easily.  It can be helpful to keep track of a log of your progress. Your caregiver can provide you with a simple table to help with this. If you are using the spirometer at home, follow these instructions: [place] IF:   You are having difficultly using the spirometer.  You have trouble using the spirometer as often as instructed.  Your pain medication is not giving enough relief while using the spirometer.  You develop fever of 100.5 F (38.1 C) or higher. SEEK IMMEDIATE MEDICAL CARE IF:   You cough up bloody sputum that had not been present before.  You develop fever of 102 F (38.9 C) or greater.  You develop worsening pain at or near the incision site. MAKE SURE YOU:   Understand these instructions.  Will watch your condition.  Will get help right away if you are not doing well or get worse. Document Released: 08/07/2006 Document Revised: 06/19/2011 Document Reviewed: 10/08/2006 Exit[employer] Patient Information 2014 [employer], [place].   ________________________________________________________________________  WHAT IS A BLOOD TRANSFUSION? Blood Transfusion Information  A transfusion is the replacement of blood or some of its parts. Blood is made up of multiple cells which provide different functions.  Red blood cells carry oxygen and are used for blood loss replacement.  White blood cells fight against infection.  Platelets control bleeding.  Plasma helps clot blood.  Other blood products are available for specialized needs, such as hemophilia or other clotting disorders. BEFORE THE TRANSFUSION  Who gives blood for transfusions?   Healthy volunteers who are fully evaluated to make sure their blood is safe. This is blood bank blood. Transfusion therapy is the safest it has ever been in the practice of medicine. Before blood is  taken from a  donor, a complete history is taken to make sure that person has no history of diseases nor engages in risky social behavior (examples are intravenous drug use or sexual activity with multiple partners). The donor's travel history is screened to minimize risk of transmitting infections, such as malaria. The donated blood is tested for signs of infectious diseases, such as HIV and hepatitis. The blood is then tested to be sure it is compatible with you in order to minimize the chance of a transfusion reaction. If you or a relative donates blood, this is often done in anticipation of surgery and is not appropriate for emergency situations. It takes many days to process the donated blood. RISKS AND COMPLICATIONS Although transfusion therapy is very safe and saves many lives, the main dangers of transfusion include:   Getting an infectious disease.  Developing a transfusion reaction. This is an allergic reaction to something in the blood you were given. Every precaution is taken to prevent this. The decision to have a blood transfusion has been considered carefully by your caregiver before blood is given. Blood is not given unless the benefits outweigh the risks. AFTER THE TRANSFUSION  Right after receiving a blood transfusion, you will usually feel much better and more energetic. This is especially true if your red blood cells have gotten low (anemic). The transfusion raises the level of the red blood cells which carry oxygen, and this usually causes an energy increase.  The nurse administering the transfusion will monitor you carefully for complications. HOME CARE INSTRUCTIONS  No special instructions are needed after a transfusion. You may find your energy is better. Speak with your caregiver about any limitations on activity for underlying diseases you may have. SEEK MEDICAL CARE IF:   Your condition is not improving after your transfusion.  You develop redness or irritation at the  intravenous (IV) site. SEEK IMMEDIATE MEDICAL CARE IF:  Any of the following symptoms occur over the next 12 hours:  Shaking chills.  You have a temperature by mouth above 102 F (38.9 C), not controlled by medicine.  Chest, back, or muscle pain.  People around you feel you are not acting correctly or are confused.  Shortness of breath or difficulty breathing.  Dizziness and fainting.  You get a rash or develop hives.  You have a decrease in urine output.  Your urine turns a dark color or changes to pink, red, or brown. Any of the following symptoms occur over the next 10 days:  You have a temperature by mouth above 102 F (38.9 C), not controlled by medicine.  Shortness of breath.  Weakness after normal activity.  The white part of the eye turns yellow (jaundice).  You have a decrease in the amount of urine or are urinating less often.  Your urine turns a dark color or changes to pink, red, or brown. Document Released: 03/24/2000 Document Revised: 06/19/2011 Document Reviewed: 11/11/2007 [facility] Patient Information 2014 [place], [place].  _______________________________________________________________________

## 2014-01-30 ENCOUNTER — Ambulatory Visit (HOSPITAL_COMMUNITY)
Admission: RE | Admit: 2014-01-30 | Discharge: 2014-01-30 | Disposition: A | Discharge status: Home / Self Care | Payer: Medicare Other | Source: Ambulatory Visit | Attending: Urology | Admitting: Urology

## 2014-01-30 ENCOUNTER — Encounter (HOSPITAL_COMMUNITY)
Admission: RE | Admit: 2014-01-30 | Discharge: 2014-01-30 | Disposition: A | Discharge status: Home / Self Care | Payer: Medicare Other | Source: Ambulatory Visit | Attending: Urology | Admitting: Urology

## 2014-01-30 ENCOUNTER — Encounter (HOSPITAL_COMMUNITY): Payer: Self-pay

## 2014-01-30 DIAGNOSIS — G4733 Obstructive sleep apnea (adult) (pediatric): Secondary | ICD-10-CM | POA: Diagnosis not present

## 2014-01-30 DIAGNOSIS — C61 Malignant neoplasm of prostate: Secondary | ICD-10-CM | POA: Insufficient documentation

## 2014-01-30 DIAGNOSIS — I10 Essential (primary) hypertension: Secondary | ICD-10-CM | POA: Diagnosis not present

## 2014-01-30 DIAGNOSIS — I2510 Atherosclerotic heart disease of native coronary artery without angina pectoris: Secondary | ICD-10-CM | POA: Diagnosis not present

## 2014-01-30 DIAGNOSIS — Z01818 Encounter for other preprocedural examination: Secondary | ICD-10-CM | POA: Diagnosis not present

## 2014-01-30 DIAGNOSIS — E291 Testicular hypofunction: Secondary | ICD-10-CM | POA: Diagnosis not present

## 2014-01-30 DIAGNOSIS — E785 Hyperlipidemia, unspecified: Secondary | ICD-10-CM | POA: Diagnosis not present

## 2014-01-30 DIAGNOSIS — Z0001 Encounter for general adult medical examination with abnormal findings: Secondary | ICD-10-CM | POA: Diagnosis not present

## 2014-01-30 DIAGNOSIS — K219 Gastro-esophageal reflux disease without esophagitis: Secondary | ICD-10-CM | POA: Diagnosis not present

## 2014-01-30 HISTORY — DX: Personal history of other malignant neoplasm of skin: Z85.828

## 2014-01-30 LAB — BASIC METABOLIC PANEL
Anion gap: 10 (ref 5–15)
BUN: 21 mg/dL (ref 6–23)
CO2: 28 mEq/L (ref 19–32)
CREATININE: 1.07 mg/dL (ref 0.50–1.35)
Calcium: 9.3 mg/dL (ref 8.4–10.5)
Chloride: 100 mEq/L (ref 96–112)
GFR, EST AFRICAN AMERICAN: 79 mL/min — AB (ref >90)
GFR, EST NON AFRICAN AMERICAN: 68 mL/min — AB (ref >90)
GLUCOSE: 92 mg/dL (ref 70–99)
Potassium: 4.6 mEq/L (ref 3.7–5.3)
Sodium: 138 mEq/L (ref 137–147)

## 2014-01-30 LAB — CBC
HCT: 41.4 % (ref 39.0–52.0)
HEMOGLOBIN: 13.9 g/dL (ref 13.0–17.0)
MCH: 28.3 pg (ref 26.0–34.0)
MCHC: 33.6 g/dL (ref 30.0–36.0)
MCV: 84.1 fL (ref 78.0–100.0)
PLATELETS: 222 10*3/uL (ref 150–400)
RBC: 4.92 MIL/uL (ref 4.22–5.81)
RDW: 13.6 % (ref 11.5–15.5)
WBC: 7.7 10*3/uL (ref 4.0–10.5)

## 2014-01-30 LAB — ABO/RH: ABO/RH(D): O POS

## 2014-02-04 NOTE — H&P (Signed)
History of Present Illness Mark [name] is a 73 year old gentleman who was noted to have an elevated PSA of 5.57 in January 2015 prompting urologic evaluation by Dr. Gaynelle Arabian. He underwent a prostate needle biopsy on 06/17/13 which demonstrated 1 out of 12 biopsy cores to be positive for Gleason 3+4=7 adenocarcinoma of the prostate. After discussing options with Dr. Gaynelle Arabian, he initially elected to proceed with active surveillance and was noted to have further elevation of his PSA to 7.53 in June 2015. He underwent an MRI of the prostate on 10/22/13 which demonstrated a 1.5 cm lesion involving the right anterior apex/mid prostate with evidence of contrast enhancement and restricted diffusion. No evidence of EPE, SVI, or LAD was noted. He was initially seen by me in consultation in August 2015.    He does take testosterone replacement therapy (Testim) which was started about one year ago by his primary care physician. His baseline symptoms included erectile dysfunction and fatigue. I am unsure of his baseline testosterone level. Therapy has significantly improved his quality of life with regard to his energy level but his erectile dysfunction has remained a major issue. He has stopped therapy as of one month ago.    His other comorbid conditions include a history of coronary artery disease s/p cardiac stent placement in 2002. He is currently treated with aspirin. His cardiologist is Dr. Liam Rogers.    Date of diagnosis: 06/17/13  TNM stage: cT1c Nx Mx  Gleason score: 3+4=7  PSA at diagnosis: 5.57  Biopsy (06/17/13): 1/12 cores positive -- R apex (20%, 3+4=7)  Prostate volume: 57.5 cc  Most recent PSA: 7.53  PSAD: 0.13    Nomogram  OC disease: 70%  EPE: 25%  SVI: 2%  LNI: 3%  PFS (surgery): 84% at 5 years, 73% at 10 years    Urinary function: He has mild to moderate lower urinary tract symptoms including nocturia, intermittency, and sense of incomplete emptying. IPSS is 8.  His symptoms are not particular bothersome.  Erectile function: He has fairly severe erectile dysfunction. SHIM score is 8. This is a relatively low priority for him.     Interval history:    Mark [name] returns today after further discussing his treatment options with Dr. Valere Dross and again with Dr. Gaynelle Arabian. He has now elected to proceed with surgery for treatment.    He follows up today with his wife in preparation for his upcoming radical prostatectomy. After her last discussion and with further discussion with Dr. Gaynelle Arabian, did decide to proceed with surgical treatment of his prostate cancer. He has received clearance from Dr. Acie Fredrickson and appears to be at low risk from a cardiac standpoint.     Past Medical History Problems  1. History of Coronary artery disease (I25.10) 2. History of arthritis (Z87.39) 3. History of hypercholesterolemia (Z86.39) 4. History of hypertension (Z86.79) 5. History of sleep apnea (Z87.09)  Surgical History Problems  1. History of Back Surgery 2. History of Cath Stent Placement 3. History of Orthopedic Surgery  Current Meds 1. Aleve TABS;  Therapy: (Recorded:10Feb2015) to Recorded 2. Aspirin 325 MG Oral Tablet;  Therapy: (Recorded:10Feb2015) to Recorded 3. Atorvastatin Calcium 80 MG Oral Tablet;  Therapy: (Recorded:30Jun2015) to Recorded 4. Citracal + D TABS;  Therapy: (Recorded:10Feb2015) to Recorded 5. CPAP;  Therapy: (Recorded:10Feb2015) to Recorded 6. Diazepam 10 MG Oral Tablet; Take tablet 1 hour prior to procedure;  Therapy: [id number] to (Last Rx:01Jul2015) Ordered 7. Diovan 320 MG Oral Tablet;  Therapy: (Recorded:10Feb2015) to Recorded 8. FiberCon  TABS;  Therapy: (Recorded:10Feb2015) to Recorded 9. Fish Oil CAPS;  Therapy: (Recorded:10Feb2015) to Recorded 10. Hydrochlorothiazide 25 MG Oral Tablet;   Therapy: [id number] to Recorded 11. ICaps MV Oral Tablet;   Therapy: (Recorded:10Feb2015) to Recorded 12. Metoprolol Tartrate  25 MG Oral Tablet;   Therapy: 22Jun2015 to Recorded 13. Naproxen TABS;   Therapy: (Recorded:30Jun2015) to Recorded 14. Nasonex 50 MCG/ACT Nasal Suspension; INSTILL SQUIRT  PRN;   Therapy: (Recorded:10Feb2015) to Recorded 15. Nitroglycerin 0.4 MG SUBL; PLACE TABLET  PRN;   Therapy: (Recorded:10Feb2015) to Recorded 16. Norco 5-325 MG Oral Tablet; TAKE TABLET  PRN;   Therapy: (Recorded:10Feb2015) to Recorded 17. Potassium Chloride Crys ER 20 MEQ Oral Tablet Extended Release;   Therapy: [id number] to Recorded 18. Stool Softener CAPS;   Therapy: (Recorded:10Feb2015) to Recorded 19. Testim 1 % GEL;   Therapy: (Recorded:10Feb2015) to Recorded 20. Vitamin D 1000 UNIT Oral Tablet;   Therapy: (Recorded:10Feb2015) to Recorded  Allergies Medication  1. Codeine Derivatives  Family History Problems  1. Family history of chronic obstructive pulmonary disease (Z82.5) : Mother 2. Family history of hypertension (Z82.49) : Sister, Brother 3. Family history of lung cancer (Z80.1) : Father, Brother  Social History Problems  1. Alcohol use   < 1-2 per mo 2. Former smoker (Z87.891)   < 1 ppd x 1yrs, quit 70yrs ago 3. Married 4. Occupation   Press photographer  Physical Exam Constitutional: Well nourished and well developed . No acute distress.  ENT:. The ears and nose are normal in appearance.  Neck: The appearance of the neck is normal and no neck mass is present.  Pulmonary: No respiratory distress, normal respiratory rhythm and effort and clear bilateral breath sounds.  Cardiovascular: Heart rate and rhythm are normal . No peripheral edema.  Abdomen: The abdomen is soft and nontender. No masses are palpated. No CVA tenderness. No hernias are palpable. No hepatosplenomegaly noted.  Rectal: Prostate size is estimated to be 50 g. The prostate has no nodularity and is not indurated.  Lymphatics: The femoral and inguinal nodes are not enlarged or tender.  Skin: Normal skin turgor, no visible rash and no  visible skin lesions.  Neuro/Psych:. Mood and affect are appropriate.    Results/Data I have reviewed his PSA results, pathology report, and records in detail today.     Assessment  Prostate cancer (185) (C61)   Plan  Prostate cancer  1. Follow-up Keep Future Appt Office  Follow-up  Status: Complete  Done: [id number] 2. PT Follow-up Office  Follow-up  Status: Hold For - Date of Service  Requested for:  [id number] 09:00AM  Prostate cancer (185) (C61)   Discussion/Summary 1. Prostate cancer: Mark [name] has elected to proceed with surgical treatment of his prostate cancer and will undergo a bilateral nerve sparing robotic-assisted laparoscopic radical prostatectomy and bilateral pelvic lymphadenectomy. We have reviewed expectations with regard to cancer control, urinary control, and erectile function. All questions were answered to his stated satisfaction.   We discussed surgical therapy for prostate cancer including the different available surgical approaches. We discussed, in detail, the risks and expectations of surgery with regard to cancer control, urinary control, and erectile function as well as the expected postoperative recovery process. Additional risks of surgery including but not limited to bleeding, infection, hernia formation, nerve damage, lymphocele formation, bowel/rectal injury potentially necessitating colostomy, damage to the urinary tract resulting in urine leakage, urethral stricture, and the cardiopulmonary risks such as myocardial infarction, stroke, death, venothromboembolism, etc. were explained. The risk of open surgical conversion  for robotic/laparoscopic prostatectomy was also discussed.     He appears to be at a low risk from a cardiac standpoint. He will decrease his dose of aspirin from 325 g to 81 mg perioperatively.    Cc: Dr. Azalia Bilis  Dr. [name]  A total of 45 minutes were spent in the overall care of the patient today with 35 minutes in  direct face to face consultation.    Signatures Electronically signed by : Raynelle Bring, M.D.; Jan 20 2014  3:25PM EST

## 2014-02-04 NOTE — Anesthesia Preprocedure Evaluation (Signed)
Anesthesia Evaluation  Patient identified by MRN, date of birth, ID band Patient awake    Reviewed: Allergy & Precautions, H&P , NPO status , Patient's Chart, lab work & pertinent test results  Airway Mallampati: II  TM Distance: >3 FB Neck ROM: Full    Dental no notable dental hx.    Pulmonary sleep apnea , former smoker,  breath sounds clear to auscultation  Pulmonary exam normal       Cardiovascular hypertension, Pt. on medications and Pt. on home beta blockers + CAD Rhythm:Regular Rate:Normal     Neuro/Psych PSYCHIATRIC DISORDERS Depression    GI/Hepatic Neg liver ROS, GERD-  ,  Endo/Other  negative endocrine ROS  Renal/GU negative Renal ROS     Musculoskeletal  (+) Arthritis -,   Abdominal   Peds  Hematology negative hematology ROS (+)   Anesthesia Other Findings   Reproductive/Obstetrics negative OB ROS                             Anesthesia Physical Anesthesia Plan  ASA: III  Anesthesia Plan: General   Post-op Pain Management:    Induction: Intravenous  Airway Management Planned: Oral ETT  Additional Equipment: None  Intra-op Plan:   Post-operative Plan: Extubation in OR  Informed Consent: I have reviewed the patients History and Physical, chart, labs and discussed the procedure including the risks, benefits and alternatives for the proposed anesthesia with the patient or authorized representative who has indicated his/her understanding and acceptance.   Dental advisory given  Plan Discussed with: CRNA  Anesthesia Plan Comments:         Anesthesia Quick Evaluation

## 2014-02-05 ENCOUNTER — Inpatient Hospital Stay (HOSPITAL_COMMUNITY)
Admission: RE | Admit: 2014-02-05 | Discharge: 2014-02-06 | DRG: 708 | Disposition: A | Discharge status: Home / Self Care | Payer: Medicare Other | Source: Ambulatory Visit | Attending: Urology | Admitting: Urology

## 2014-02-05 ENCOUNTER — Encounter (HOSPITAL_COMMUNITY): Payer: Self-pay

## 2014-02-05 ENCOUNTER — Encounter (HOSPITAL_COMMUNITY)
Admission: RE | Disposition: A | Discharge status: Home / Self Care | Payer: Self-pay | Source: Ambulatory Visit | Attending: Urology

## 2014-02-05 ENCOUNTER — Encounter (HOSPITAL_COMMUNITY): Payer: Medicare Other | Admitting: Certified Registered Nurse Anesthetist

## 2014-02-05 ENCOUNTER — Inpatient Hospital Stay (HOSPITAL_COMMUNITY): Payer: Medicare Other | Admitting: Certified Registered Nurse Anesthetist

## 2014-02-05 DIAGNOSIS — Z7982 Long term (current) use of aspirin: Secondary | ICD-10-CM | POA: Diagnosis not present

## 2014-02-05 DIAGNOSIS — C61 Malignant neoplasm of prostate: Principal | ICD-10-CM | POA: Diagnosis present

## 2014-02-05 DIAGNOSIS — Z87891 Personal history of nicotine dependence: Secondary | ICD-10-CM

## 2014-02-05 DIAGNOSIS — I2510 Atherosclerotic heart disease of native coronary artery without angina pectoris: Secondary | ICD-10-CM | POA: Diagnosis not present

## 2014-02-05 DIAGNOSIS — Z955 Presence of coronary angioplasty implant and graft: Secondary | ICD-10-CM

## 2014-02-05 DIAGNOSIS — I10 Essential (primary) hypertension: Secondary | ICD-10-CM | POA: Diagnosis not present

## 2014-02-05 HISTORY — PX: LYMPHADENECTOMY: SHX5960

## 2014-02-05 HISTORY — PX: ROBOT ASSISTED LAPAROSCOPIC RADICAL PROSTATECTOMY: SHX5141

## 2014-02-05 LAB — TYPE AND SCREEN
ABO/RH(D): O POS
ANTIBODY SCREEN: NEGATIVE

## 2014-02-05 LAB — HEMOGLOBIN AND HEMATOCRIT, BLOOD
HCT: 36 % — ABNORMAL LOW (ref 39.0–52.0)
Hemoglobin: 12.3 g/dL — ABNORMAL LOW (ref 13.0–17.0)

## 2014-02-05 SURGERY — ROBOTIC ASSISTED LAPAROSCOPIC RADICAL PROSTATECTOMY LEVEL 2
Anesthesia: General

## 2014-02-05 MED ORDER — PROPOFOL 10 MG/ML IV BOLUS
INTRAVENOUS | Status: DC | PRN
  Administered 2014-02-05: 200 mg via INTRAVENOUS

## 2014-02-05 MED ORDER — EPHEDRINE SULFATE 50 MG/ML IJ SOLN
INTRAMUSCULAR | Status: AC
  Filled 2014-02-05: qty 1

## 2014-02-05 MED ORDER — DEXAMETHASONE SODIUM PHOSPHATE 10 MG/ML IJ SOLN
INTRAMUSCULAR | Status: DC | PRN
  Administered 2014-02-05: 10 mg via INTRAVENOUS

## 2014-02-05 MED ORDER — PROPOFOL 10 MG/ML IV BOLUS
INTRAVENOUS | Status: AC
  Filled 2014-02-05: qty 20

## 2014-02-05 MED ORDER — MIDAZOLAM HCL 2 MG/2ML IJ SOLN
INTRAMUSCULAR | Status: AC
  Filled 2014-02-05: qty 2

## 2014-02-05 MED ORDER — DEXAMETHASONE SODIUM PHOSPHATE 10 MG/ML IJ SOLN
INTRAMUSCULAR | Status: AC
  Filled 2014-02-05: qty 1

## 2014-02-05 MED ORDER — LIDOCAINE HCL (CARDIAC) 20 MG/ML IV SOLN
INTRAVENOUS | Status: AC
  Filled 2014-02-05: qty 5

## 2014-02-05 MED ORDER — ATORVASTATIN CALCIUM 80 MG PO TABS
80.0000 mg | ORAL_TABLET | Freq: Every day | ORAL | Status: DC
  Administered 2014-02-05: 80 mg via ORAL
  Filled 2014-02-05: qty 1

## 2014-02-05 MED ORDER — MIDAZOLAM HCL 5 MG/5ML IJ SOLN
INTRAMUSCULAR | Status: DC | PRN
  Administered 2014-02-05: 2 mg via INTRAVENOUS

## 2014-02-05 MED ORDER — EPHEDRINE SULFATE 50 MG/ML IJ SOLN
INTRAMUSCULAR | Status: DC | PRN
  Administered 2014-02-05: 5 mg via INTRAVENOUS

## 2014-02-05 MED ORDER — FENTANYL CITRATE 0.05 MG/ML IJ SOLN
INTRAMUSCULAR | Status: AC
  Filled 2014-02-05: qty 5

## 2014-02-05 MED ORDER — LACTATED RINGERS IV SOLN
INTRAVENOUS | Status: DC | PRN
  Administered 2014-02-05 (×3): via INTRAVENOUS

## 2014-02-05 MED ORDER — KCL IN DEXTROSE-NACL 20-5-0.45 MEQ/L-%-% IV SOLN
INTRAVENOUS | Status: DC
  Administered 2014-02-05 – 2014-02-06 (×2): via INTRAVENOUS
  Filled 2014-02-05 (×4): qty 1000

## 2014-02-05 MED ORDER — PROMETHAZINE HCL 25 MG/ML IJ SOLN: 6.2500 mg | INTRAMUSCULAR | Status: DC | PRN

## 2014-02-05 MED ORDER — SODIUM CHLORIDE 0.9 % IJ SOLN
INTRAMUSCULAR | Status: AC
Start: 2014-02-05 — End: 2014-02-05
  Filled 2014-02-05: qty 10

## 2014-02-05 MED ORDER — NEOSTIGMINE METHYLSULFATE 10 MG/10ML IV SOLN
INTRAVENOUS | Status: DC | PRN
  Administered 2014-02-05: 5 mg via INTRAVENOUS

## 2014-02-05 MED ORDER — DOCUSATE SODIUM 100 MG PO CAPS
100.0000 mg | ORAL_CAPSULE | Freq: Two times a day (BID) | ORAL | Status: DC
  Administered 2014-02-05 – 2014-02-06 (×3): 100 mg via ORAL
  Filled 2014-02-05 (×3): qty 1

## 2014-02-05 MED ORDER — ROCURONIUM BROMIDE 100 MG/10ML IV SOLN
INTRAVENOUS | Status: DC | PRN
  Administered 2014-02-05: 50 mg via INTRAVENOUS
  Administered 2014-02-05 (×3): 10 mg via INTRAVENOUS

## 2014-02-05 MED ORDER — DIPHENHYDRAMINE HCL 12.5 MG/5ML PO ELIX: 12.5000 mg | ORAL_SOLUTION | Freq: Four times a day (QID) | ORAL | Status: DC | PRN

## 2014-02-05 MED ORDER — ASPIRIN EC 81 MG PO TBEC
81.0000 mg | DELAYED_RELEASE_TABLET | Freq: Every morning | ORAL | Status: DC
  Administered 2014-02-05 – 2014-02-06 (×2): 81 mg via ORAL
  Filled 2014-02-05 (×2): qty 1

## 2014-02-05 MED ORDER — LACTATED RINGERS IV SOLN
INTRAVENOUS | Status: DC | PRN
  Administered 2014-02-05: 08:00:00

## 2014-02-05 MED ORDER — SUCCINYLCHOLINE CHLORIDE 20 MG/ML IJ SOLN
INTRAMUSCULAR | Status: DC | PRN
  Administered 2014-02-05: 100 mg via INTRAVENOUS

## 2014-02-05 MED ORDER — FENTANYL CITRATE 0.05 MG/ML IJ SOLN
INTRAMUSCULAR | Status: DC | PRN
  Administered 2014-02-05 (×11): 50 ug via INTRAVENOUS

## 2014-02-05 MED ORDER — HYDROCHLOROTHIAZIDE 25 MG PO TABS
25.0000 mg | ORAL_TABLET | Freq: Every day | ORAL | Status: DC
  Administered 2014-02-05: 25 mg via ORAL
  Filled 2014-02-05 (×2): qty 1

## 2014-02-05 MED ORDER — FENTANYL CITRATE 0.05 MG/ML IJ SOLN
INTRAMUSCULAR | Status: AC
  Filled 2014-02-05: qty 2

## 2014-02-05 MED ORDER — GLYCOPYRROLATE 0.2 MG/ML IJ SOLN
INTRAMUSCULAR | Status: DC | PRN
  Administered 2014-02-05: 0.2 mg via INTRAVENOUS
  Administered 2014-02-05: .8 mg via INTRAVENOUS

## 2014-02-05 MED ORDER — CEFAZOLIN SODIUM 1-5 GM-% IV SOLN
1.0000 g | Freq: Three times a day (TID) | INTRAVENOUS | Status: AC
  Administered 2014-02-05 – 2014-02-06 (×2): 1 g via INTRAVENOUS
  Filled 2014-02-05 (×2): qty 50

## 2014-02-05 MED ORDER — MEPERIDINE HCL 50 MG/ML IJ SOLN: 6.2500 mg | INTRAMUSCULAR | Status: DC | PRN

## 2014-02-05 MED ORDER — SODIUM CHLORIDE 0.9 % IV BOLUS (SEPSIS)
1000.0000 mL | Freq: Once | INTRAVENOUS | Status: AC
  Administered 2014-02-05: 1000 mL via INTRAVENOUS

## 2014-02-05 MED ORDER — ATROPINE SULFATE 0.4 MG/ML IJ SOLN
INTRAMUSCULAR | Status: AC
  Filled 2014-02-05: qty 1

## 2014-02-05 MED ORDER — KCL IN DEXTROSE-NACL 20-5-0.45 MEQ/L-%-% IV SOLN
INTRAVENOUS | Status: AC
  Filled 2014-02-05: qty 1000

## 2014-02-05 MED ORDER — BUPIVACAINE-EPINEPHRINE 0.25% -1:200000 IJ SOLN
INTRAMUSCULAR | Status: AC
  Filled 2014-02-05: qty 1

## 2014-02-05 MED ORDER — GLYCOPYRROLATE 0.2 MG/ML IJ SOLN
INTRAMUSCULAR | Status: AC
  Filled 2014-02-05: qty 1

## 2014-02-05 MED ORDER — ROCURONIUM BROMIDE 100 MG/10ML IV SOLN
INTRAVENOUS | Status: AC
  Filled 2014-02-05: qty 1

## 2014-02-05 MED ORDER — HYDROMORPHONE HCL 2 MG/ML IJ SOLN
INTRAMUSCULAR | Status: AC
  Filled 2014-02-05: qty 1

## 2014-02-05 MED ORDER — BUPIVACAINE-EPINEPHRINE 0.25% -1:200000 IJ SOLN
INTRAMUSCULAR | Status: DC | PRN
  Administered 2014-02-05: 28 mL

## 2014-02-05 MED ORDER — GLYCOPYRROLATE 0.2 MG/ML IJ SOLN
INTRAMUSCULAR | Status: AC
  Filled 2014-02-05: qty 3

## 2014-02-05 MED ORDER — CIPROFLOXACIN HCL 500 MG PO TABS: 500.0000 mg | ORAL_TABLET | Freq: Two times a day (BID) | ORAL | Status: DC

## 2014-02-05 MED ORDER — PHENYLEPHRINE HCL 10 MG/ML IJ SOLN
INTRAMUSCULAR | Status: DC | PRN
  Administered 2014-02-05 (×3): 40 ug via INTRAVENOUS

## 2014-02-05 MED ORDER — HYDROMORPHONE HCL 1 MG/ML IJ SOLN
0.2500 mg | INTRAMUSCULAR | Status: DC | PRN
  Administered 2014-02-05 (×2): 0.5 mg via INTRAVENOUS

## 2014-02-05 MED ORDER — CEFAZOLIN SODIUM-DEXTROSE 2-3 GM-% IV SOLR
INTRAVENOUS | Status: AC
  Filled 2014-02-05: qty 50

## 2014-02-05 MED ORDER — PHENYLEPHRINE 40 MCG/ML (10ML) SYRINGE FOR IV PUSH (FOR BLOOD PRESSURE SUPPORT)
PREFILLED_SYRINGE | INTRAVENOUS | Status: AC
  Filled 2014-02-05: qty 10

## 2014-02-05 MED ORDER — MORPHINE SULFATE 2 MG/ML IJ SOLN
2.0000 mg | INTRAMUSCULAR | Status: DC | PRN
Start: 2014-02-05 — End: 2014-02-06
  Administered 2014-02-05: 2 mg via INTRAVENOUS
  Filled 2014-02-05: qty 1

## 2014-02-05 MED ORDER — METOPROLOL TARTRATE 25 MG PO TABS
12.5000 mg | ORAL_TABLET | Freq: Two times a day (BID) | ORAL | Status: DC
  Administered 2014-02-05: 12.5 mg via ORAL
  Administered 2014-02-06: 12:00:00 via ORAL
  Filled 2014-02-05 (×2): qty 1

## 2014-02-05 MED ORDER — LIDOCAINE HCL (CARDIAC) 20 MG/ML IV SOLN
INTRAVENOUS | Status: DC | PRN
  Administered 2014-02-05: 100 mg via INTRAVENOUS

## 2014-02-05 MED ORDER — HYDROMORPHONE HCL 1 MG/ML IJ SOLN
INTRAMUSCULAR | Status: AC
  Filled 2014-02-05: qty 1

## 2014-02-05 MED ORDER — HEPARIN SODIUM (PORCINE) 1000 UNIT/ML IJ SOLN
INTRAMUSCULAR | Status: AC
  Filled 2014-02-05: qty 1

## 2014-02-05 MED ORDER — NITROGLYCERIN 0.4 MG SL SUBL: 0.4000 mg | SUBLINGUAL_TABLET | SUBLINGUAL | Status: DC | PRN

## 2014-02-05 MED ORDER — SODIUM CHLORIDE 0.9 % IR SOLN
Status: DC | PRN
  Administered 2014-02-05: 1000 mL via INTRAVESICAL

## 2014-02-05 MED ORDER — HYDROCODONE-ACETAMINOPHEN 5-325 MG PO TABS: 1.0000 | ORAL_TABLET | Freq: Four times a day (QID) | ORAL | Status: DC | PRN

## 2014-02-05 MED ORDER — DIPHENHYDRAMINE HCL 50 MG/ML IJ SOLN: 12.5000 mg | Freq: Four times a day (QID) | INTRAMUSCULAR | Status: DC | PRN

## 2014-02-05 MED ORDER — ACETAMINOPHEN 325 MG PO TABS: 650.0000 mg | ORAL_TABLET | ORAL | Status: DC | PRN

## 2014-02-05 MED ORDER — CEFAZOLIN SODIUM-DEXTROSE 2-3 GM-% IV SOLR
2.0000 g | INTRAVENOUS | Status: AC
  Administered 2014-02-05: 2 g via INTRAVENOUS

## 2014-02-05 MED ORDER — NEOSTIGMINE METHYLSULFATE 10 MG/10ML IV SOLN
INTRAVENOUS | Status: AC
  Filled 2014-02-05: qty 1

## 2014-02-05 MED ORDER — KETOROLAC TROMETHAMINE 15 MG/ML IJ SOLN
15.0000 mg | Freq: Four times a day (QID) | INTRAMUSCULAR | Status: DC
  Administered 2014-02-05 – 2014-02-06 (×5): 15 mg via INTRAVENOUS
  Filled 2014-02-05 (×7): qty 1

## 2014-02-05 MED ORDER — ONDANSETRON HCL 4 MG/2ML IJ SOLN
INTRAMUSCULAR | Status: AC
  Filled 2014-02-05: qty 2

## 2014-02-05 MED ORDER — ONDANSETRON HCL 4 MG/2ML IJ SOLN
INTRAMUSCULAR | Status: DC | PRN
  Administered 2014-02-05: 4 mg via INTRAVENOUS

## 2014-02-05 MED ORDER — HYDROMORPHONE HCL 1 MG/ML IJ SOLN
INTRAMUSCULAR | Status: DC | PRN
  Administered 2014-02-05 (×4): 0.5 mg via INTRAVENOUS

## 2014-02-05 SURGICAL SUPPLY — 50 items
CABLE HIGH FREQUENCY MONO STRZ (ELECTRODE) ×3 IMPLANT
CATH FOLEY 2WAY SLVR 18FR 30CC (CATHETERS) ×3 IMPLANT
CATH ROBINSON RED A/P 16FR (CATHETERS) ×3 IMPLANT
CATH ROBINSON RED A/P 8FR (CATHETERS) ×3 IMPLANT
CATH TIEMANN FOLEY 18FR 5CC (CATHETERS) ×3 IMPLANT
CHLORAPREP W/TINT 26ML (MISCELLANEOUS) ×3 IMPLANT
CLIP LIGATING HEM O LOK PURPLE (MISCELLANEOUS) ×7 IMPLANT
CLOTH BEACON ORANGE TIMEOUT ST (SAFETY) ×3 IMPLANT
COVER SURGICAL LIGHT HANDLE (MISCELLANEOUS) ×3 IMPLANT
COVER TIP SHEARS 8 DVNC (MISCELLANEOUS) ×2 IMPLANT
COVER TIP SHEARS 8MM DA VINCI (MISCELLANEOUS) ×1
CUTTER ECHEON FLEX ENDO 45 340 (ENDOMECHANICALS) ×3 IMPLANT
DECANTER SPIKE VIAL GLASS SM (MISCELLANEOUS) ×3 IMPLANT
DRAPE SURG IRRIG POUCH 19X23 (DRAPES) ×3 IMPLANT
DRSG TEGADERM 4X4.75 (GAUZE/BANDAGES/DRESSINGS) ×3 IMPLANT
DRSG TEGADERM 6X8 (GAUZE/BANDAGES/DRESSINGS) ×6 IMPLANT
ELECT REM PT RETURN 9FT ADLT (ELECTROSURGICAL) ×3
ELECTRODE REM PT RTRN 9FT ADLT (ELECTROSURGICAL) ×2 IMPLANT
GLOVE BIO SURGEON STRL SZ 6.5 (GLOVE) ×3 IMPLANT
GLOVE BIOGEL M STRL SZ7.5 (GLOVE) ×6 IMPLANT
GOWN STRL REUS W/TWL LRG LVL3 (GOWN DISPOSABLE) ×9 IMPLANT
HEMOSTAT SURGICEL 4X8 (HEMOSTASIS) ×1 IMPLANT
HOLDER FOLEY CATH W/STRAP (MISCELLANEOUS) ×3 IMPLANT
IV LACTATED RINGERS 1000ML (IV SOLUTION) ×3 IMPLANT
KIT ACCESSORY DA VINCI DISP (KITS) ×1
KIT ACCESSORY DVNC DISP (KITS) ×2 IMPLANT
LIQUID BAND (GAUZE/BANDAGES/DRESSINGS) ×1 IMPLANT
MANIFOLD NEPTUNE II (INSTRUMENTS) ×3 IMPLANT
NDL SAFETY ECLIPSE 18X1.5 (NEEDLE) ×2 IMPLANT
NEEDLE HYPO 18GX1.5 SHARP (NEEDLE) ×3
PACK ROBOT UROLOGY CUSTOM (CUSTOM PROCEDURE TRAY) ×3 IMPLANT
RELOAD GREEN ECHELON 45 (STAPLE) ×3 IMPLANT
SET TUBE IRRIG SUCTION NO TIP (IRRIGATION / IRRIGATOR) ×3 IMPLANT
SOLUTION ELECTROLUBE (MISCELLANEOUS) ×3 IMPLANT
SUT ETHILON 3 0 PS 1 (SUTURE) ×3 IMPLANT
SUT MNCRL 3 0 RB1 (SUTURE) ×2 IMPLANT
SUT MNCRL 3 0 VIOLET RB1 (SUTURE) ×2 IMPLANT
SUT MNCRL AB 3-0 PS2 18 (SUTURE) ×1 IMPLANT
SUT MNCRL AB 4-0 PS2 18 (SUTURE) ×6 IMPLANT
SUT MONOCRYL 3 0 RB1 (SUTURE) ×4
SUT VIC AB 0 CT1 27 (SUTURE) ×3
SUT VIC AB 0 CT1 27XBRD ANTBC (SUTURE) ×2 IMPLANT
SUT VIC AB 0 UR5 27 (SUTURE) ×3 IMPLANT
SUT VIC AB 2-0 SH 27 (SUTURE) ×3
SUT VIC AB 2-0 SH 27X BRD (SUTURE) ×2 IMPLANT
SUT VICRYL 0 UR6 27IN ABS (SUTURE) ×6 IMPLANT
SYR 27GX1/2 1ML LL SAFETY (SYRINGE) ×3 IMPLANT
TOWEL OR 17X26 10 PK STRL BLUE (TOWEL DISPOSABLE) ×3 IMPLANT
TOWEL OR NON WOVEN STRL DISP B (DISPOSABLE) ×3 IMPLANT
WATER STERILE IRR 1500ML POUR (IV SOLUTION) ×6 IMPLANT

## 2014-02-05 NOTE — Progress Notes (Signed)
Utilization review completed.  

## 2014-02-05 NOTE — Op Note (Signed)
Preoperative diagnosis: Clinically localized adenocarcinoma of the prostate (clinical stage T1c N0 Mx)  Postoperative diagnosis: Clinically localized adenocarcinoma of the prostate (clinical stage T1c Nx Mx)  Procedure:  1. Robotic assisted laparoscopic radical prostatectomy (bilateral nerve sparing) 2. Bilateral robotic assisted laparoscopic pelvic lymphadenectomy  Surgeon: Pryor Curia. M.D.  Assistant: Debbrah Alar PA-C  Resident: Dr. Curt Bears  Anesthesia: General  Complications: None  EBL: 500 mL  IVF:  2500 mL crystalloid  Specimens: 1. Prostate and seminal vesicles 2. Right pelvic lymph nodes 3. Left pelvic lymph nodes  Disposition of specimens: Pathology  Drains: 1. 20 Fr coude catheter 2. # 19 Blake pelvic drain  Indication: Mark [name] is a 73 y.o. year old patient with clinically localized prostate cancer.  After a thorough review of the management options for treatment of prostate cancer, he elected to proceed with surgical therapy and the above procedure(s).  We have discussed the potential benefits and risks of the procedure, side effects of the proposed treatment, the likelihood of the patient achieving the goals of the procedure, and any potential problems that might occur during the procedure or recuperation. Informed consent has been obtained.  Description of procedure:  The patient was taken to the operating room and a general anesthetic was administered. He was given preoperative antibiotics, placed in the dorsal lithotomy position, and prepped and draped in the usual sterile fashion. Next a preoperative timeout was performed. A urethral catheter was placed into the bladder and a site was selected near the umbilicus for placement of the camera port. This was placed using a standard open Hassan technique which allowed entry into the peritoneal cavity under direct vision and without difficulty. A 12 mm port was placed and a pneumoperitoneum  established. The camera was then used to inspect the abdomen and there was no evidence of any intra-abdominal injuries or other abnormalities. The remaining abdominal ports were then placed. 8 mm robotic ports were placed in the right lower quadrant, left lower quadrant, and far left lateral abdominal wall. A 5 mm port was placed in the right upper quadrant and a 12 mm port was placed in the right lateral abdominal wall for laparoscopic assistance. All ports were placed under direct vision without difficulty. The surgical cart was then docked.   Utilizing the cautery scissors, the bladder was reflected posteriorly allowing entry into the space of Retzius and identification of the endopelvic fascia and prostate. The periprostatic fat was then removed from the prostate allowing full exposure of the endopelvic fascia. The endopelvic fascia was then incised from the apex back to the base of the prostate bilaterally and the underlying levator muscle fibers were swept laterally off the prostate thereby isolating the dorsal venous complex. The dorsal vein was then stapled and divided with a 45 mm Flex Echelon stapler. Attention then turned to the bladder neck which was divided anteriorly thereby allowing entry into the bladder and exposure of the urethral catheter. The catheter balloon was deflated and the catheter was brought into the operative field and used to retract the prostate anteriorly. The posterior bladder neck was then examined and was divided allowing further dissection between the bladder and prostate posteriorly until the vasa deferentia and seminal vessels were identified. The vasa deferentia were isolated, divided, and lifted anteriorly. The seminal vesicles were dissected down to their tips with care to control the seminal vascular arterial blood supply. These structures were then lifted anteriorly and the space between Denonvillier's fascia and the anterior rectum  was developed with a combination of  sharp and blunt dissection. This isolated the vascular pedicles of the prostate.  The lateral prostatic fascia was then sharply incised allowing release of the neurovascular bundles bilaterally. The vascular pedicles of the prostate were then ligated with Weck clips between the prostate and neurovascular bundles and divided with sharp cold scissor dissection resulting in neurovascular bundle preservation. The neurovascular bundles were then separated off the apex of the prostate and urethra bilaterally.  The urethra was then sharply transected allowing the prostate specimen to be disarticulated. The pelvis was copiously irrigated and hemostasis was ensured. There was no evidence for rectal injury.  Attention then turned to the right pelvic sidewall. The fibrofatty tissue between the external iliac vein, confluence of the iliac vessels, hypogastric artery, and Cooper's ligament was dissected free from the pelvic sidewall with care to preserve the obturator nerve. Weck clips were used for lymphostasis and hemostasis. An identical procedure was performed on the contralateral side and the lymphatic packets were removed for permanent pathologic analysis.  Attention then turned to the urethral anastomosis. A 2-0 Vicryl slip knot was placed between Denonvillier's fascia, the posterior bladder neck, and the posterior urethra to reapproximate these structures. A double-armed 3-0 Monocryl suture was then used to perform a 360 running tension-free anastomosis between the bladder neck and urethra. A new urethral catheter was then placed into the bladder and irrigated. There initially was noted to be a leak.  Additional 3-0 vicryl interrupted sutures were placed.  The catheter was replaced and irrigated and there was no leak. There were no blood clots within the bladder and the anastomosis appeared to be watertight. A #19 Blake drain was then brought through the left lateral 8 mm port site and positioned appropriately  within the pelvis. It was secured to the skin with a nylon suture. The surgical cart was then undocked. The right lateral 12 mm port site was closed at the fascial level with a 0 Vicryl suture placed laparoscopically. All remaining ports were then removed under direct vision. The prostate specimen was removed intact within the Endopouch retrieval bag via the periumbilical camera port site. This fascial opening was closed with two running 0 Vicryl sutures. 0.25% Marcaine was then injected into all port sites and all incisions were reapproximated at the skin level with 4-0 Monocryl subcuticular sutures and Dermabond. The patient appeared to tolerate the procedure well and without complications. The patient was able to be extubated and transferred to the recovery unit in satisfactory condition.   Pryor Curia MD

## 2014-02-05 NOTE — Interval H&P Note (Signed)
History and Physical Interval Note:  02/05/2014 7:19 AM  Mark [name]  has presented today for surgery, with the diagnosis of PROSTATE CANCER  The various methods of treatment have been discussed with the patient and family. After consideration of risks, benefits and other options for treatment, the patient has consented to  Procedure(s): ROBOTIC ASSISTED LAPAROSCOPIC RADICAL PROSTATECTOMY LEVEL 2 (N/A) LYMPHADENECTOMY (Bilateral) as a surgical intervention .  The patient's history has been reviewed, patient examined, no change in status, stable for surgery.  I have reviewed the patient's chart and labs.  Questions were answered to the patient's satisfaction.     Fawaz Borquez,LES

## 2014-02-05 NOTE — Discharge Instructions (Signed)

## 2014-02-05 NOTE — Transfer of Care (Signed)
Immediate Anesthesia Transfer of Care Note  Patient: Mark [name]  Procedure(s) Performed: Procedure(s) (LRB): ROBOTIC ASSISTED LAPAROSCOPIC RADICAL PROSTATECTOMY LEVEL 2 (N/A) LYMPHADENECTOMY (Bilateral)  Patient Location: PACU  Anesthesia Type: General  Level of Consciousness: sedated, patient cooperative and responds to stimulation  Airway & Oxygen Therapy: Patient Spontanous Breathing and Patient connected to face mask oxgen  Post-op Assessment: Report given to PACU RN and Post -op Vital signs reviewed and stable  Post vital signs: Reviewed and stable  Complications: No apparent anesthesia complications

## 2014-02-05 NOTE — Anesthesia Postprocedure Evaluation (Signed)
Anesthesia Post Note  Patient: Mark [name]  Procedure(s) Performed: Procedure(s) (LRB): ROBOTIC ASSISTED LAPAROSCOPIC RADICAL PROSTATECTOMY LEVEL 2 (N/A) LYMPHADENECTOMY (Bilateral)  Anesthesia type: General  Patient location: PACU  Post pain: Pain level controlled  Post assessment: Post-op Vital signs reviewed  Last Vitals: BP 143/69  Pulse 75  Temp(Src) 36.4 C (Oral)  Resp 14  Ht 5\' 9"  (1.753 m)  Wt 216 lb (97.977 kg)  BMI 31.88 kg/m2  SpO2 95%  Post vital signs: Reviewed  Level of consciousness: sedated  Complications: No apparent anesthesia complications

## 2014-02-05 NOTE — Progress Notes (Signed)
Patient ID: Mark [name], male   DOB: 12/27/1940, 73 y.o.   MRN: [id number] Post-op note  Subjective: The patient is doing well.  No complaints.  Denies N/V.  Low UO   Objective: Vital signs in last 24 hours: Temp:  [97.5 F (36.4 C)-98 F (36.7 C)] 97.5 F (36.4 C) (10/29 1400) Pulse Rate:  [56-91] 75 (10/29 1400) Resp:  [10-16] 14 (10/29 1400) BP: (122-164)/(65-92) 143/69 mmHg (10/29 1400) SpO2:  [95 %-100 %] 95 % (10/29 1400) Weight:  [97.977 kg (216 lb)-98.204 kg (216 lb 8 oz)] 97.977 kg (216 lb) (10/29 1244)  Intake/Output from previous day:   Intake/Output this shift: Total I/O In: 3050 [I.V.:2050; IV Piggyback:1000] Out: 680 [Urine:30; Drains:150; Blood:500]  Physical Exam:  General: Alert and oriented. Abdomen: Soft, Nondistended. Incisions: Clean and dry. Urine: bloody  Lab Results:  Recent Labs  02/05/14 1231  HGB 12.3*  HCT 36.0*    Assessment/Plan: POD#0   1) Continue to monitor  2) DVT prophy, clears, IS, amb, pain control  3) UO has improved slightly over past two hours; continue to monitor and give IVF bolus if needed.   LOS: 0 days   [place] 02/05/2014, 3:52 PM

## 2014-02-06 LAB — HEMOGLOBIN AND HEMATOCRIT, BLOOD
HCT: 28.9 % — ABNORMAL LOW (ref 39.0–52.0)
HCT: 30.7 % — ABNORMAL LOW (ref 39.0–52.0)
Hemoglobin: 10.1 g/dL — ABNORMAL LOW (ref 13.0–17.0)
Hemoglobin: 10.5 g/dL — ABNORMAL LOW (ref 13.0–17.0)

## 2014-02-06 MED ORDER — MENTHOL 3 MG MT LOZG
1.0000 | LOZENGE | OROMUCOSAL | Status: DC | PRN
  Filled 2014-02-06: qty 9

## 2014-02-06 MED ORDER — HYDROCODONE-ACETAMINOPHEN 5-325 MG PO TABS: 1.0000 | ORAL_TABLET | Freq: Four times a day (QID) | ORAL | Status: DC | PRN

## 2014-02-06 MED ORDER — ZOLPIDEM TARTRATE 5 MG PO TABS
5.0000 mg | ORAL_TABLET | Freq: Every evening | ORAL | Status: DC | PRN
  Administered 2014-02-06: 5 mg via ORAL
  Filled 2014-02-06: qty 1

## 2014-02-06 MED ORDER — BISACODYL 10 MG RE SUPP
10.0000 mg | Freq: Once | RECTAL | Status: AC
  Administered 2014-02-06: 10 mg via RECTAL
  Filled 2014-02-06: qty 1

## 2014-02-06 MED ORDER — PHENOL 1.4 % MT LIQD
1.0000 | OROMUCOSAL | Status: DC | PRN
  Administered 2014-02-06: 1 via OROMUCOSAL
  Filled 2014-02-06: qty 177

## 2014-02-06 NOTE — Discharge Summary (Signed)
Date of admission: 02/05/2014  Date of discharge: 02/06/2014  Admission diagnosis: Prostate Cancer  Discharge diagnosis: Prostate Cancer  History and Physical: For full details, please see admission history and physical. Briefly, Mark [name] is a 73 y.o. gentleman with localized prostate cancer.  After discussing management/treatment options, he elected to proceed with surgical treatment.  Hospital Course: ISSAIC WELLIVER was taken to the operating room on 02/05/2014 and underwent a robotic assisted laparoscopic radical prostatectomy. He tolerated this procedure well and without complications. Postoperatively, he was able to be transferred to a regular hospital room following recovery from anesthesia.  He was able to begin ambulating the night of surgery. He remained hemodynamically stable overnight.  He had excellent urine output with appropriately minimal output from his pelvic drain and his pelvic drain was removed on POD #1.  He was transitioned to oral pain medication, tolerated a clear liquid diet, and had met all discharge criteria and was able to be discharged home later on POD#1.  Laboratory values:  Recent Labs  02/05/14 1231 02/06/14 0455 02/06/14 1109  HGB 12.3* 10.1* 10.5*  HCT 36.0* 28.9* 30.7*    Disposition: Home  Discharge instruction: He was instructed to be ambulatory but to refrain from heavy lifting, strenuous activity, or driving. He was instructed on urethral catheter care.  Discharge medications:     Medication List    STOP taking these medications       Calcium Citrate-Vitamin D 250-200 MG-UNIT Tabs     Fish Oil 1200 MG Caps     naproxen 500 MG tablet  Commonly known as:  NAPROSYN     OVER THE COUNTER MEDICATION      TAKE these medications       aspirin EC 81 MG tablet  Take 81 mg by mouth every morning.     atorvastatin 80 MG tablet  Commonly known as:  LIPITOR  Take 80 mg by mouth at bedtime. 1/2 TAB DAILY     ciprofloxacin 500  MG tablet  Commonly known as:  CIPRO  Take 1 tablet (500 mg total) by mouth 2 (two) times daily. Start day prior to office visit for foley removal     docusate sodium 100 MG capsule  Commonly known as:  COLACE  Take 100 mg by mouth once as needed for mild constipation.     hydrochlorothiazide 25 MG tablet  Commonly known as:  HYDRODIURIL  Take 25 mg by mouth every morning.     HYDROcodone-acetaminophen 5-325 MG per tablet  Commonly known as:  NORCO/VICODIN  Take 1-2 tablets by mouth every 6 (six) hours as needed for moderate pain.     Hydrocortisone-Iodoquinol 1-1 % Crea  Apply 1 application topically daily as needed (rash).     metoprolol tartrate 25 MG tablet  Commonly known as:  LOPRESSOR  Take 12.5 mg by mouth 2 (two) times daily.     nitroGLYCERIN 0.4 MG SL tablet  Commonly known as:  NITROSTAT  Place 0.4 mg under the tongue every 5 (five) minutes as needed for chest pain.     polycarbophil 625 MG tablet  Commonly known as:  FIBERCON  Take 1,250 mg by mouth 2 (two) times daily.     potassium chloride SA 20 MEQ tablet  Commonly known as:  K-DUR,KLOR-CON  Take 40 mEq by mouth every morning.     valsartan 160 MG tablet  Commonly known as:  DIOVAN  Take 160 mg by mouth every morning.     VISINE  OP  Place 1 drop into both eyes once as needed (dry eyes.).        Followup: He will followup in 1 week for catheter removal and to discuss his surgical pathology results.

## 2014-02-06 NOTE — Progress Notes (Signed)
Pt left at this time with his spouse at his side. Pt alert, oriented, and without c/o. Discharge instructions/prescriptions given/explained with pt and spouse verbalizing understanding.  Discharge supplies given and Foley and leg bag teaching done with pt and spouse.  Followup appointment noted.

## 2014-02-06 NOTE — Progress Notes (Signed)
Patient ID: Mark [name], male   DOB: [birth year], 73 y.o.   MRN: [id number]  1 Day Post-Op Subjective: The patient is doing well.  No nausea or vomiting. Pain is adequately controlled.  Objective: Vital signs in last 24 hours: Temp:  [97.5 F (36.4 C)-98.7 F (37.1 C)] 97.8 F (36.6 C) (10/30 0544) Pulse Rate:  [60-91] 65 (10/30 0544) Resp:  [10-18] 18 (10/30 0544) BP: (110-164)/(54-92) 113/54 mmHg (10/30 0544) SpO2:  [95 %-100 %] 100 % (10/30 0544) Weight:  [97.977 kg (216 lb)] 97.977 kg (216 lb) (10/29 1244)  Intake/Output from previous day: 10/29 0701 - 10/30 0700 In: 6314 [P.O.:120; I.V.:3625; IV Piggyback:1000] Out: 9702 [Urine:3330; Drains:330; Blood:500] Intake/Output this shift:    Physical Exam:  General: Alert and oriented. CV: RRR Lungs: Clear bilaterally. GI: Soft, Nondistended. Incisions: Clean, dry, and intact Urine: Clear Extremities: Nontender, no erythema, no edema.  Lab Results:  Recent Labs  02/05/14 1231 02/06/14 0455  HGB 12.3* 10.1*  HCT 36.0* 28.9*      Assessment/Plan: POD# 1 s/p robotic prostatectomy.  1) SL IVF 2) Ambulate, Incentive spirometry 3) Transition to oral pain medication 4) Dulcolax suppository 5) D/C pelvic drain 6) Plan for likely discharge later today   Pryor Curia. MD   LOS: 1 day   Mark [name],LES 02/06/2014, 7:11 AM

## 2014-02-09 ENCOUNTER — Encounter (HOSPITAL_COMMUNITY): Payer: Self-pay | Admitting: Urology

## 2014-03-09 DIAGNOSIS — R278 Other lack of coordination: Secondary | ICD-10-CM | POA: Diagnosis not present

## 2014-03-09 DIAGNOSIS — N393 Stress incontinence (female) (male): Secondary | ICD-10-CM | POA: Diagnosis not present

## 2014-03-09 DIAGNOSIS — M6281 Muscle weakness (generalized): Secondary | ICD-10-CM | POA: Diagnosis not present

## 2014-03-18 DIAGNOSIS — H3531 Nonexudative age-related macular degeneration: Secondary | ICD-10-CM | POA: Diagnosis not present

## 2014-03-18 DIAGNOSIS — H35373 Puckering of macula, bilateral: Secondary | ICD-10-CM | POA: Diagnosis not present

## 2014-03-18 DIAGNOSIS — H40013 Open angle with borderline findings, low risk, bilateral: Secondary | ICD-10-CM | POA: Diagnosis not present

## 2014-03-18 DIAGNOSIS — H2513 Age-related nuclear cataract, bilateral: Secondary | ICD-10-CM | POA: Diagnosis not present

## 2014-03-20 ENCOUNTER — Telehealth: Payer: Self-pay | Admitting: Cardiovascular Disease

## 2014-03-20 DIAGNOSIS — M6281 Muscle weakness (generalized): Secondary | ICD-10-CM | POA: Diagnosis not present

## 2014-03-20 DIAGNOSIS — C61 Malignant neoplasm of prostate: Secondary | ICD-10-CM | POA: Diagnosis not present

## 2014-03-20 DIAGNOSIS — R278 Other lack of coordination: Secondary | ICD-10-CM | POA: Diagnosis not present

## 2014-03-20 DIAGNOSIS — N393 Stress incontinence (female) (male): Secondary | ICD-10-CM | POA: Diagnosis not present

## 2014-03-20 NOTE — Telephone Encounter (Signed)
New Message  Pt called to verify appt. He has yearly appts in January, but had a an appt with Dr. Acie Fredrickson in Oct, he wanted to speak with a nurse to see if this Oct appt could take the place of the yearly appt; Please call back and discuss.

## 2014-03-20 NOTE — Telephone Encounter (Signed)
Left message for patient that his last office visit with Dr. Acie Fredrickson in October can serve as his yearly office visit and his follow-up will be due October 2016.  I advised patient to call back with questions or concerns.

## 2014-03-27 DIAGNOSIS — N529 Male erectile dysfunction, unspecified: Secondary | ICD-10-CM | POA: Diagnosis not present

## 2014-03-27 DIAGNOSIS — N393 Stress incontinence (female) (male): Secondary | ICD-10-CM | POA: Diagnosis not present

## 2014-03-27 DIAGNOSIS — C61 Malignant neoplasm of prostate: Secondary | ICD-10-CM | POA: Diagnosis not present

## 2014-03-31 DIAGNOSIS — B372 Candidiasis of skin and nail: Secondary | ICD-10-CM | POA: Diagnosis not present

## 2014-03-31 DIAGNOSIS — D485 Neoplasm of uncertain behavior of skin: Secondary | ICD-10-CM | POA: Diagnosis not present

## 2014-04-06 DIAGNOSIS — N481 Balanitis: Secondary | ICD-10-CM | POA: Diagnosis not present

## 2014-04-06 DIAGNOSIS — R278 Other lack of coordination: Secondary | ICD-10-CM | POA: Diagnosis not present

## 2014-04-06 DIAGNOSIS — N393 Stress incontinence (female) (male): Secondary | ICD-10-CM | POA: Diagnosis not present

## 2014-04-06 DIAGNOSIS — M6281 Muscle weakness (generalized): Secondary | ICD-10-CM | POA: Diagnosis not present

## 2014-04-14 DIAGNOSIS — N481 Balanitis: Secondary | ICD-10-CM | POA: Diagnosis not present

## 2014-04-14 DIAGNOSIS — L570 Actinic keratosis: Secondary | ICD-10-CM | POA: Diagnosis not present

## 2014-04-29 DIAGNOSIS — R278 Other lack of coordination: Secondary | ICD-10-CM | POA: Diagnosis not present

## 2014-04-29 DIAGNOSIS — M6281 Muscle weakness (generalized): Secondary | ICD-10-CM | POA: Diagnosis not present

## 2014-04-29 DIAGNOSIS — N393 Stress incontinence (female) (male): Secondary | ICD-10-CM | POA: Diagnosis not present

## 2014-05-27 DIAGNOSIS — R278 Other lack of coordination: Secondary | ICD-10-CM | POA: Diagnosis not present

## 2014-05-27 DIAGNOSIS — N393 Stress incontinence (female) (male): Secondary | ICD-10-CM | POA: Diagnosis not present

## 2014-05-27 DIAGNOSIS — M6281 Muscle weakness (generalized): Secondary | ICD-10-CM | POA: Diagnosis not present

## 2014-06-25 DIAGNOSIS — N393 Stress incontinence (female) (male): Secondary | ICD-10-CM | POA: Diagnosis not present

## 2014-06-25 DIAGNOSIS — R278 Other lack of coordination: Secondary | ICD-10-CM | POA: Diagnosis not present

## 2014-06-25 DIAGNOSIS — M6281 Muscle weakness (generalized): Secondary | ICD-10-CM | POA: Diagnosis not present

## 2014-07-07 DIAGNOSIS — M6281 Muscle weakness (generalized): Secondary | ICD-10-CM | POA: Diagnosis not present

## 2014-07-07 DIAGNOSIS — R278 Other lack of coordination: Secondary | ICD-10-CM | POA: Diagnosis not present

## 2014-07-07 DIAGNOSIS — C61 Malignant neoplasm of prostate: Secondary | ICD-10-CM | POA: Diagnosis not present

## 2014-07-07 DIAGNOSIS — N393 Stress incontinence (female) (male): Secondary | ICD-10-CM | POA: Diagnosis not present

## 2014-07-15 DIAGNOSIS — N393 Stress incontinence (female) (male): Secondary | ICD-10-CM | POA: Diagnosis not present

## 2014-07-15 DIAGNOSIS — C61 Malignant neoplasm of prostate: Secondary | ICD-10-CM | POA: Diagnosis not present

## 2014-07-15 DIAGNOSIS — N481 Balanitis: Secondary | ICD-10-CM | POA: Diagnosis not present

## 2014-07-20 DIAGNOSIS — R278 Other lack of coordination: Secondary | ICD-10-CM | POA: Diagnosis not present

## 2014-07-20 DIAGNOSIS — N393 Stress incontinence (female) (male): Secondary | ICD-10-CM | POA: Diagnosis not present

## 2014-07-20 DIAGNOSIS — M6281 Muscle weakness (generalized): Secondary | ICD-10-CM | POA: Diagnosis not present

## 2014-07-30 DIAGNOSIS — N393 Stress incontinence (female) (male): Secondary | ICD-10-CM | POA: Diagnosis not present

## 2014-07-30 DIAGNOSIS — R278 Other lack of coordination: Secondary | ICD-10-CM | POA: Diagnosis not present

## 2014-07-30 DIAGNOSIS — M6281 Muscle weakness (generalized): Secondary | ICD-10-CM | POA: Diagnosis not present

## 2014-08-04 ENCOUNTER — Encounter: Payer: Self-pay | Admitting: Gastroenterology

## 2014-08-05 DIAGNOSIS — M6281 Muscle weakness (generalized): Secondary | ICD-10-CM | POA: Diagnosis not present

## 2014-08-05 DIAGNOSIS — R278 Other lack of coordination: Secondary | ICD-10-CM | POA: Diagnosis not present

## 2014-08-05 DIAGNOSIS — N393 Stress incontinence (female) (male): Secondary | ICD-10-CM | POA: Diagnosis not present

## 2014-08-11 DIAGNOSIS — R300 Dysuria: Secondary | ICD-10-CM | POA: Diagnosis not present

## 2014-08-17 DIAGNOSIS — N393 Stress incontinence (female) (male): Secondary | ICD-10-CM | POA: Diagnosis not present

## 2014-08-17 DIAGNOSIS — M6281 Muscle weakness (generalized): Secondary | ICD-10-CM | POA: Diagnosis not present

## 2014-08-17 DIAGNOSIS — R278 Other lack of coordination: Secondary | ICD-10-CM | POA: Diagnosis not present

## 2014-08-27 DIAGNOSIS — R278 Other lack of coordination: Secondary | ICD-10-CM | POA: Diagnosis not present

## 2014-08-27 DIAGNOSIS — N393 Stress incontinence (female) (male): Secondary | ICD-10-CM | POA: Diagnosis not present

## 2014-08-27 DIAGNOSIS — M6281 Muscle weakness (generalized): Secondary | ICD-10-CM | POA: Diagnosis not present

## 2014-09-09 DIAGNOSIS — L570 Actinic keratosis: Secondary | ICD-10-CM | POA: Diagnosis not present

## 2014-09-09 DIAGNOSIS — E785 Hyperlipidemia, unspecified: Secondary | ICD-10-CM | POA: Diagnosis not present

## 2014-09-09 DIAGNOSIS — R5382 Chronic fatigue, unspecified: Secondary | ICD-10-CM | POA: Diagnosis not present

## 2014-09-09 DIAGNOSIS — I10 Essential (primary) hypertension: Secondary | ICD-10-CM | POA: Diagnosis not present

## 2014-09-09 DIAGNOSIS — E291 Testicular hypofunction: Secondary | ICD-10-CM | POA: Diagnosis not present

## 2014-09-24 DIAGNOSIS — M6281 Muscle weakness (generalized): Secondary | ICD-10-CM | POA: Diagnosis not present

## 2014-09-24 DIAGNOSIS — R278 Other lack of coordination: Secondary | ICD-10-CM | POA: Diagnosis not present

## 2014-09-24 DIAGNOSIS — N393 Stress incontinence (female) (male): Secondary | ICD-10-CM | POA: Diagnosis not present

## 2014-10-05 ENCOUNTER — Other Ambulatory Visit: Payer: Self-pay

## 2014-10-21 DIAGNOSIS — N393 Stress incontinence (female) (male): Secondary | ICD-10-CM | POA: Diagnosis not present

## 2014-10-21 DIAGNOSIS — R278 Other lack of coordination: Secondary | ICD-10-CM | POA: Diagnosis not present

## 2014-10-21 DIAGNOSIS — M6281 Muscle weakness (generalized): Secondary | ICD-10-CM | POA: Diagnosis not present

## 2014-11-19 DIAGNOSIS — M6281 Muscle weakness (generalized): Secondary | ICD-10-CM | POA: Diagnosis not present

## 2014-11-19 DIAGNOSIS — R278 Other lack of coordination: Secondary | ICD-10-CM | POA: Diagnosis not present

## 2014-11-19 DIAGNOSIS — N393 Stress incontinence (female) (male): Secondary | ICD-10-CM | POA: Diagnosis not present

## 2014-11-26 DIAGNOSIS — H25813 Combined forms of age-related cataract, bilateral: Secondary | ICD-10-CM | POA: Diagnosis not present

## 2014-11-26 DIAGNOSIS — H524 Presbyopia: Secondary | ICD-10-CM | POA: Diagnosis not present

## 2014-11-26 DIAGNOSIS — H35373 Puckering of macula, bilateral: Secondary | ICD-10-CM | POA: Diagnosis not present

## 2014-12-02 DIAGNOSIS — D044 Carcinoma in situ of skin of scalp and neck: Secondary | ICD-10-CM | POA: Diagnosis not present

## 2014-12-02 DIAGNOSIS — L570 Actinic keratosis: Secondary | ICD-10-CM | POA: Diagnosis not present

## 2014-12-02 DIAGNOSIS — B078 Other viral warts: Secondary | ICD-10-CM | POA: Diagnosis not present

## 2014-12-02 DIAGNOSIS — C44319 Basal cell carcinoma of skin of other parts of face: Secondary | ICD-10-CM | POA: Diagnosis not present

## 2014-12-02 DIAGNOSIS — D485 Neoplasm of uncertain behavior of skin: Secondary | ICD-10-CM | POA: Diagnosis not present

## 2014-12-24 DIAGNOSIS — N393 Stress incontinence (female) (male): Secondary | ICD-10-CM | POA: Diagnosis not present

## 2014-12-24 DIAGNOSIS — R278 Other lack of coordination: Secondary | ICD-10-CM | POA: Diagnosis not present

## 2014-12-24 DIAGNOSIS — M6281 Muscle weakness (generalized): Secondary | ICD-10-CM | POA: Diagnosis not present

## 2014-12-25 DIAGNOSIS — C44319 Basal cell carcinoma of skin of other parts of face: Secondary | ICD-10-CM | POA: Diagnosis not present

## 2014-12-25 DIAGNOSIS — Z85828 Personal history of other malignant neoplasm of skin: Secondary | ICD-10-CM | POA: Diagnosis not present

## 2015-01-04 DIAGNOSIS — H3531 Nonexudative age-related macular degeneration: Secondary | ICD-10-CM | POA: Diagnosis not present

## 2015-01-04 DIAGNOSIS — H35373 Puckering of macula, bilateral: Secondary | ICD-10-CM | POA: Diagnosis not present

## 2015-01-04 DIAGNOSIS — H43813 Vitreous degeneration, bilateral: Secondary | ICD-10-CM | POA: Diagnosis not present

## 2015-01-04 DIAGNOSIS — H3581 Retinal edema: Secondary | ICD-10-CM | POA: Diagnosis not present

## 2015-01-20 DIAGNOSIS — N393 Stress incontinence (female) (male): Secondary | ICD-10-CM | POA: Diagnosis not present

## 2015-01-20 DIAGNOSIS — R278 Other lack of coordination: Secondary | ICD-10-CM | POA: Diagnosis not present

## 2015-01-20 DIAGNOSIS — M6281 Muscle weakness (generalized): Secondary | ICD-10-CM | POA: Diagnosis not present

## 2015-01-20 DIAGNOSIS — C61 Malignant neoplasm of prostate: Secondary | ICD-10-CM | POA: Diagnosis not present

## 2015-01-27 DIAGNOSIS — C61 Malignant neoplasm of prostate: Secondary | ICD-10-CM | POA: Diagnosis not present

## 2015-01-27 DIAGNOSIS — N393 Stress incontinence (female) (male): Secondary | ICD-10-CM | POA: Diagnosis not present

## 2015-02-03 DIAGNOSIS — H25813 Combined forms of age-related cataract, bilateral: Secondary | ICD-10-CM | POA: Diagnosis not present

## 2015-02-03 DIAGNOSIS — H524 Presbyopia: Secondary | ICD-10-CM | POA: Diagnosis not present

## 2015-02-10 ENCOUNTER — Encounter: Payer: Self-pay | Admitting: *Deleted

## 2015-02-15 ENCOUNTER — Ambulatory Visit (INDEPENDENT_AMBULATORY_CARE_PROVIDER_SITE_OTHER): Payer: Medicare Other | Admitting: Cardiovascular Disease

## 2015-02-15 ENCOUNTER — Encounter: Payer: Self-pay | Admitting: Cardiovascular Disease

## 2015-02-15 VITALS — BP 102/80 | HR 52 | Ht 69.0 in | Wt 215.0 lb

## 2015-02-15 DIAGNOSIS — I10 Essential (primary) hypertension: Secondary | ICD-10-CM

## 2015-02-15 DIAGNOSIS — I2510 Atherosclerotic heart disease of native coronary artery without angina pectoris: Secondary | ICD-10-CM

## 2015-02-15 NOTE — Progress Notes (Signed)
Mark [name] Date of Birth  [birth year]       [facility] [address], [address], [place], [place]  [address]   [place], [place]  [address] [phone]     [phone]   Fax  [phone]    Fax [phone]  Problem List: 1. CAD - PCI to LAD - 2002 Mark [name])  2. Hypertension 3. Hyperlipidemia   History of Present Illness:  Mark [name] was recently admitted to the hospital with some CP.  Stress myoview was negative.   He is back doing his normal activities.   He swims regularly ( 3 times a week)    Dec. 10, 2014:  He has had some back pain  / sciatica.  No angina.   Able to do all of his normal activities without problems.   He is retired from [employer].    Oct. 13, 2015:  Mark [name] is doing well.  No CP,  On ASA alone  He was diagnosed with prostate cancer in March 2015.     He is off Testosterone supplement.   Now needs surgery.   He has elected to go with the [facility] robotic surgery.    He is splitting his metoprolol in half and taking twice a day.   He swims on a regular basis-perhaps 3 times a week. He swims for about 30-45 minutes and probably as much as one mild each day that he swims. He has no trouble climbing  1-2 flights of stairs. He is somewhat limited by his left ankle injury.  Had a stress Myoview study last year which showed:  IMPRESSION:  1. No scintigraphic evidence of prior infarction or  pharmacologically induced ischemia.  2. Normal wall motion. Ejection fraction - 71%.  Nov. 7, 2016:  Mark [name] is doing great. Swims 3 times a week .  No CP or dyspnea  Had robotic prostate surgery last year.  Is doing well with that .     Current Outpatient Prescriptions on File Prior to Visit  Medication Sig Dispense Refill  . aspirin EC 81 MG tablet Take 81 mg by mouth every morning.    Marland Kitchen atorvastatin (LIPITOR) 80 MG tablet Take 80 mg by mouth at bedtime. 1/2 TAB DAILY    . docusate sodium (COLACE) 100 MG  capsule Take 100 mg by mouth once as needed for mild constipation.     . hydrochlorothiazide (HYDRODIURIL) 25 MG tablet Take 25 mg by mouth every morning.    Marland Kitchen HYDROcodone-acetaminophen (NORCO/VICODIN) 5-325 MG per tablet Take 1-2 tablets by mouth every 6 (six) hours as needed for moderate pain. 30 tablet 0  . Iodoquinol-HC (HYDROCORTISONE-IODOQUINOL) 1-1 % CREA Apply 1 application topically daily as needed (rash).    . metoprolol tartrate (LOPRESSOR) 25 MG tablet Take 12.5 mg by mouth 2 (two) times daily.     . nitroGLYCERIN (NITROSTAT) 0.4 MG SL tablet Place 0.4 mg under the tongue every 5 (five) minutes as needed for chest pain.    . polycarbophil (FIBERCON) 625 MG tablet Take 1,250 mg by mouth 2 (two) times daily.     . potassium chloride SA (K-DUR,KLOR-CON) 20 MEQ tablet Take 40 mEq by mouth every morning.    . Tetrahydrozoline HCl (VISINE OP) Place 1 drop into both eyes once as needed (dry eyes.).    Marland Kitchen valsartan (DIOVAN) 160 MG tablet Take 160 mg by mouth every morning.  No current facility-administered medications on file prior to visit.  also taking Metoprolol 12.5 BID   Allergies  Allergen Reactions  . Codeine Itching  . Percocet [Oxycodone-Acetaminophen] Itching    Past Medical History  Diagnosis Date  . Environmental allergies   . Hypertension   . Hyperlipidemia   . 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 CAD (coronary artery disease) 2002    stent placement  . Macular degeneration   . Sciatica   . Depression   . Arthritis   . Prostate cancer ([facility]) 06/17/13    gleason 3+4=7, vol 57.52 cc  . Sleep apnea     no longer needs c- pap per sleep study per pt  . Esophageal reflux     infrequent - no meds  . Prostate cancer ([place])   . History of skin cancer     Past Surgical History  Procedure Laterality Date  . Angioplasty  2002    stent placement  . Elbow surgery  1998      left  . Back surgery  1998, 2002, 2005    lumbar fusion with hardware  . Prostate biopsy  06/17/13    gleason 7  . Robot assisted laparoscopic radical prostatectomy N/A 02/05/2014    Procedure: ROBOTIC ASSISTED LAPAROSCOPIC RADICAL PROSTATECTOMY LEVEL 2;  Surgeon: Raynelle Bring, MD;  Location: [facility];  Service: Urology;  Laterality: N/A;  . Lymphadenectomy Bilateral 02/05/2014    Procedure: LYMPHADENECTOMY;  Surgeon: Raynelle Bring, MD;  Location: [facility];  Service: Urology;  Laterality: Bilateral;    History  Smoking status  . Former Smoker -- 0.50 packs/day for 20 years  . Types: Cigarettes  . Quit date: 05/21/1978  Smokeless tobacco  . Never Used    History  Alcohol Use  . 0.6 oz/week  . 1 Glasses of wine per week    Comment: occasional    Family History  Problem Relation Age of Onset  . Colon cancer Neg Hx   . Stomach cancer Neg Hx   . CAD Brother 93    early  . Lung cancer Brother   . Hypertension Brother   . COPD Mother   . Lung cancer Father   . Hypertension Sister   . Diabetes Brother   . Heart attack Brother 83  . Diabetes Mother   . Hypertension Mother   . Heart disease Mother   . Breast cancer Mother   . Thyroid disease Mother     Reviw of Systems:  Reviewed in the HPI.  All other systems are negative.  Physical Exam: Blood pressure 102/80, pulse 52, height 5\' 9"  (1.753 m), weight 215 lb (97.523 kg). General: Well developed, well nourished, in no acute distress.  Head: Normocephalic, atraumatic, sclera non-icteric, mucus membranes are moist,   Neck: Supple. Carotids are 2 + without bruits. No JVD   Lungs: Clear   Heart: RR, normal S1, S2.   Abdomen: Soft, non-tender, non-distended with normal bowel sounds.  Msk:  Strength and tone are normal   Extremities: No clubbing or cyanosis. No edema.  Distal pedal pulses are 2+ and equal    Neuro: CN II - XII intact.  Alert and oriented X 3.   Psych:  Normal   ECG: Nov. 7, 2016:  Sinus brady  at 52. Otherwise normal ECG  Assessment / Plan:   1. CAD - PCI to  LAD - 2002 Mark [name])  - no angina , doing   2. Hypertension - well controlled. Continue same meds   3. Hyperlipidemia - managed by Dr. Kenton Kingfisher .  Will see him in 1 year.      Mark [name], Wonda Cheng, MD  02/15/2015 3:50 PM    [place],  [place], [place]  [address] Pager [phone] Phone: [phone]; Fax: [phone]   [facility]  [address] [place], [place]  [address] [phone]   Fax [phone]

## 2015-02-15 NOTE — Patient Instructions (Signed)
Medication Instructions:  Your physician recommends that you continue on your current medications as directed. Please refer to the Current Medication list given to you today.   Labwork: None ordered  Testing/Procedures: None ordered  Follow-Up: Your physician wants you to follow-up in: 1 year with Dr. Nahser. You will receive a reminder letter in the mail two months in advance. If you don't receive a letter, please call our office to schedule the follow-up appointment.   Any Other Special Instructions Will Be Listed Below (If Applicable).     If you need a refill on your cardiac medications before your next appointment, please call your pharmacy. \  

## 2015-02-18 DIAGNOSIS — N393 Stress incontinence (female) (male): Secondary | ICD-10-CM | POA: Diagnosis not present

## 2015-02-18 DIAGNOSIS — R278 Other lack of coordination: Secondary | ICD-10-CM | POA: Diagnosis not present

## 2015-02-18 DIAGNOSIS — M6281 Muscle weakness (generalized): Secondary | ICD-10-CM | POA: Diagnosis not present

## 2015-02-19 DIAGNOSIS — F320 Major depressive disorder, single episode, mild: Secondary | ICD-10-CM | POA: Diagnosis not present

## 2015-03-01 DIAGNOSIS — H2512 Age-related nuclear cataract, left eye: Secondary | ICD-10-CM | POA: Diagnosis not present

## 2015-03-15 DIAGNOSIS — H25812 Combined forms of age-related cataract, left eye: Secondary | ICD-10-CM | POA: Diagnosis not present

## 2015-03-15 DIAGNOSIS — H2512 Age-related nuclear cataract, left eye: Secondary | ICD-10-CM | POA: Diagnosis not present

## 2015-03-18 DIAGNOSIS — R278 Other lack of coordination: Secondary | ICD-10-CM | POA: Diagnosis not present

## 2015-03-18 DIAGNOSIS — N393 Stress incontinence (female) (male): Secondary | ICD-10-CM | POA: Diagnosis not present

## 2015-03-18 DIAGNOSIS — M6281 Muscle weakness (generalized): Secondary | ICD-10-CM | POA: Diagnosis not present

## 2015-07-08 DIAGNOSIS — H35372 Puckering of macula, left eye: Secondary | ICD-10-CM | POA: Diagnosis not present

## 2016-02-25 ENCOUNTER — Ambulatory Visit (INDEPENDENT_AMBULATORY_CARE_PROVIDER_SITE_OTHER): Payer: Medicare Other | Admitting: Cardiovascular Disease

## 2016-02-25 ENCOUNTER — Encounter: Payer: Self-pay | Admitting: Cardiovascular Disease

## 2016-02-25 VITALS — BP 135/85 | HR 48 | Ht 69.0 in | Wt 215.0 lb

## 2016-02-25 DIAGNOSIS — I10 Essential (primary) hypertension: Secondary | ICD-10-CM

## 2016-02-25 DIAGNOSIS — I2510 Atherosclerotic heart disease of native coronary artery without angina pectoris: Secondary | ICD-10-CM

## 2016-02-25 DIAGNOSIS — E785 Hyperlipidemia, unspecified: Secondary | ICD-10-CM | POA: Diagnosis not present

## 2016-02-25 NOTE — Patient Instructions (Signed)
Medication Instructions:  STOP Metoprolol   Labwork: None Ordered   Testing/Procedures: None Ordered   Follow-Up: Your physician wants you to follow-up in: 1 year with Dr. Acie Fredrickson.  You will receive a reminder letter in the mail two months in advance. If you don't receive a letter, please call our office to schedule the follow-up appointment.   If you need a refill on your cardiac medications before your next appointment, please call your pharmacy.   Thank you for choosing [facility]! Christen Bame, RN [phone]

## 2016-02-25 NOTE — Progress Notes (Signed)
Mark [name] Date of Birth  11-02-1940       [place] [id number], [address], [name], [name]  [address]   [place], [place]  [address] [phone]     [phone]   Fax  [phone]    Fax [phone]  Problem List: 1. CAD - PCI to LAD - 2002 Mark [name])  2. Hypertension 3. Hyperlipidemia    Mark [name] was recently admitted to the hospital with some CP.  Stress myoview was negative.   He is back doing his normal activities.   He swims regularly ( 3 times a week)    Dec. 10, 2014:  He has had some back pain  / sciatica.  No angina.   Able to do all of his normal activities without problems.   He is retired from [employer].    Oct. 13, 2015:  Mark [name] is doing well.  No CP,  On ASA alone  He was diagnosed with prostate cancer in March 2015.     He is off Testosterone supplement.   Now needs surgery.   He has elected to go with the [facility] robotic surgery.    He is splitting his metoprolol in half and taking twice a day.   He swims on a regular basis-perhaps 3 times a week. He swims for about 30-45 minutes and probably as much as one mild each day that he swims. He has no trouble climbing  1-2 flights of stairs. He is somewhat limited by his left ankle injury.  Had a stress Myoview study last year which showed:  IMPRESSION:  1. No scintigraphic evidence of prior infarction or  pharmacologically induced ischemia.  2. Normal wall motion. Ejection fraction - 71%.  Nov. 7, 2016:  Mark [name] is doing great. Swims 3 times a week .  No CP or dyspnea  Had robotic prostate surgery last year.  Is doing well with that .  Nov. 17, 2017:  Mark [name] is seen today for follow up of his CAD, HR is slow today   Tries to avoid salt Has some occasional episodes of orthostasis   Does not check his BP at home .      Current Outpatient Prescriptions on File Prior to Visit  Medication Sig Dispense Refill  . aspirin EC 81 MG tablet  Take 81 mg by mouth every morning.    Marland Kitchen atorvastatin (LIPITOR) 80 MG tablet Take 40 mg by mouth at bedtime. 1/2 TAB DAILY    . hydrochlorothiazide (HYDRODIURIL) 25 MG tablet Take 25 mg by mouth every morning.    Marland Kitchen HYDROcodone-acetaminophen (NORCO/VICODIN) 5-325 MG per tablet Take 1-2 tablets by mouth every 6 (six) hours as needed for moderate pain. 30 tablet 0  . Iodoquinol-HC (HYDROCORTISONE-IODOQUINOL) 1-1 % CREA Apply 1 application topically daily as needed (rash).    . metoprolol tartrate (LOPRESSOR) 25 MG tablet Take 12.5 mg by mouth 2 (two) times daily.     . nitroGLYCERIN (NITROSTAT) 0.4 MG SL tablet Place 0.4 mg under the tongue every 5 (five) minutes as needed for chest pain.    . Omega-3 Fatty Acids (FISH OIL) 1000 MG CAPS Take 1 capsule by mouth daily.    . polycarbophil (FIBERCON) 625 MG tablet Take 1,250 mg by mouth 2 (two) times daily.     . potassium chloride SA (K-DUR,KLOR-CON) 20 MEQ tablet Take 40 mEq by mouth every morning.    Marland Kitchen  valsartan (DIOVAN) 160 MG tablet Take 160 mg by mouth every morning.    . docusate sodium (COLACE) 100 MG capsule Take 100 mg by mouth once as needed for mild constipation.     . Tetrahydrozoline HCl (VISINE OP) Place 1 drop into both eyes once as needed (dry eyes.).     No current facility-administered medications on file prior to visit.   also taking Metoprolol 12.5 BID   Allergies  Allergen Reactions  . Codeine Itching  . Percocet [Oxycodone-Acetaminophen] Itching    Past Medical History:  Diagnosis Date  . Arthritis   . CAD (coronary artery disease) 2002   stent placement  . Depression   . Environmental allergies   . Esophageal reflux    infrequent - no meds  . History of skin cancer   . Hyperlipidemia   . Hypertension   . Macular degeneration   . 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 Prostate cancer (Mark [name]) 06/17/13   gleason 3+4=7, vol  57.52 cc  . Prostate cancer (Mark [name])   . Sciatica   . Sleep apnea    no longer needs c- pap per sleep study per pt    Past Surgical History:  Procedure Laterality Date  . ANGIOPLASTY  2002   stent placement  . [place], 2002, 2005   lumbar fusion with hardware  . [name]   left  . LYMPHADENECTOMY Bilateral 02/05/2014   Procedure: LYMPHADENECTOMY;  Surgeon: Raynelle Bring, MD;  Location: [facility];  Service: Urology;  Laterality: Bilateral;  . PROSTATE BIOPSY  06/17/13   gleason 7  . ROBOT ASSISTED LAPAROSCOPIC RADICAL PROSTATECTOMY N/A 02/05/2014   Procedure: ROBOTIC ASSISTED LAPAROSCOPIC RADICAL PROSTATECTOMY LEVEL 2;  Surgeon: Raynelle Bring, MD;  Location: [facility];  Service: Urology;  Laterality: N/A;    History  Smoking Status  . Former Smoker  . Packs/day: 0.50  . Years: 20.00  . Types: Cigarettes  . Quit date: 05/21/1978  Smokeless Tobacco  . Never Used    History  Alcohol Use  . 0.6 oz/week  . 1 Glasses of wine per week    Comment: occasional    Family History  Problem Relation Age of Onset  . Colon cancer Neg Hx   . Stomach cancer Neg Hx   . CAD Brother 58    early  . Lung cancer Brother   . Hypertension Brother   . COPD Mother   . Lung cancer Father   . Hypertension Sister   . Diabetes Brother   . Heart attack Brother 24  . Diabetes Mother   . Hypertension Mother   . Heart disease Mother   . Breast cancer Mother   . Thyroid disease Mother     Reviw of Systems:  Reviewed in the HPI.  All other systems are negative.  Physical Exam: Blood pressure (!) 148/72, pulse (!) 48, height 5\' 9"  (1.753 m), weight 215 lb (97.5 kg). General: Well developed, well nourished, in no acute distress.  Head: Normocephalic, atraumatic, sclera non-icteric, mucus membranes are moist,   Neck: Supple. Carotids are 2 + without bruits. No JVD   Lungs: Clear   Heart: RR, normal S1, S2.   Abdomen: Soft, non-tender, non-distended with normal bowel  sounds.  Msk:  Strength and tone are normal   Extremities: No clubbing or cyanosis. No edema.  Distal pedal  pulses are 2+ and equal    Neuro: CN II - XII intact.  Alert and oriented X 3.   Psych:  Normal   ECG: Nov. 17, 2017:   Sinus brady at 50 with 1st degree AV block    Assessment / Plan:   1. CAD - PCI to LAD - 2002 Mark [name])  - no angina , doing well  2. Hypertension - well controlled. Continue same meds , He'll measure his blood pressure at home on occasion. We will be discontinuing the metoprolol because of his bradycardia. If his BP  increases I anticipate increasing his valsartan.  3. Hyperlipidemia - managed by Dr. Kenton Kingfisher .  4. Bradycardia: We will stop his metoprolol and follow his blood pressure and heart rates.   Will see him in 1 year.   He is followed by the [facility]     [name], MD  02/25/2016 8:23 AM    [place],  [place], [place]  [address] Pager [phone] Phone: [phone]; Fax: [phone]   [facility]  [address] [place], [place]  [address] [phone]   Fax [phone]

## 2016-04-20 ENCOUNTER — Encounter: Payer: Self-pay | Admitting: Gastroenterology

## 2016-04-20 IMAGING — MR MR PROSTATE WO/W CM
23 of 53 series · 23 of 53 positions shown · IV contrast (multihance)
Comparison: None.

CLINICAL DATA: Biopsy-proven prostate carcinoma Gleason grade 7 (3
+4).

EXAM:
MR PROSTATE WITHOUT AND WITH CONTRAST
TECHNIQUE: Multiplanar multisequence MRI images were obtained of the pelvis
centered about the prostate. Pre and post contrast images were
obtained.
CONTRAST:  20mL MULTIHANCE GADOBENATE DIMEGLUMINE 529 MG/ML IV SOLN

[Series 3: bSSFP fat-sat · axial · 6.0mm · 0.86mm/px · 1 of 44 slices shown]
[im 1/44]
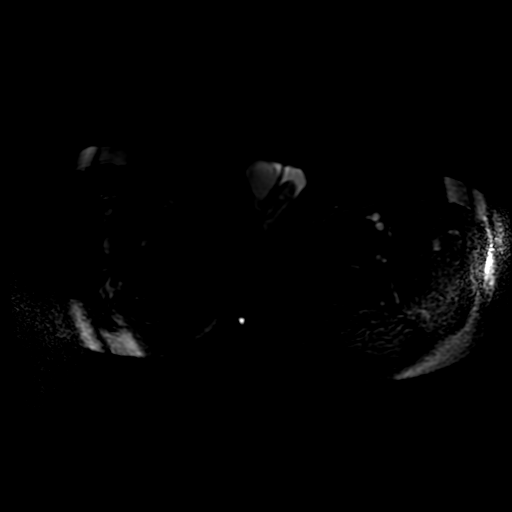

[Series 4: T1 · axial · 6.0mm · 0.86mm/px · 1 of 44 slices shown (1 of 2)]
[im 1/44]
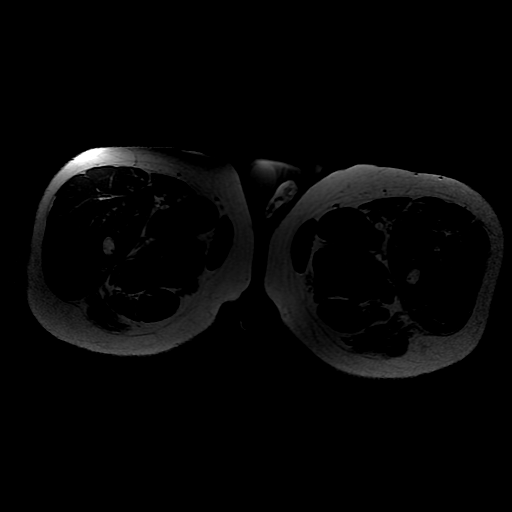

[Series 5: T2 · axial · 3.0mm · 0.29mm/px · 1 of 26 slices shown (1 of 3)]
[im 1/26]
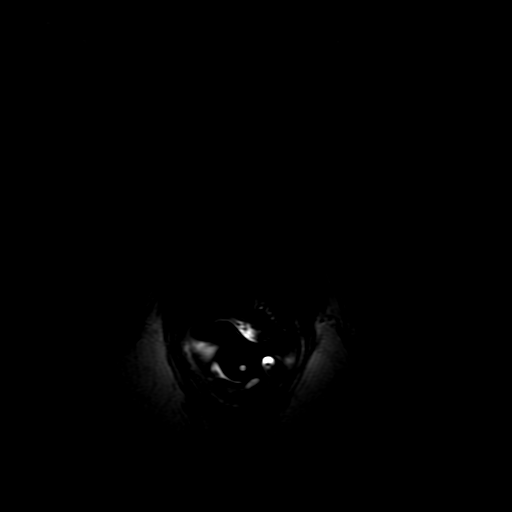

[Series 6: T1 · axial · 3.0mm · 0.29mm/px · 1 of 26 slices shown (2 of 2)]
[im 1/26]
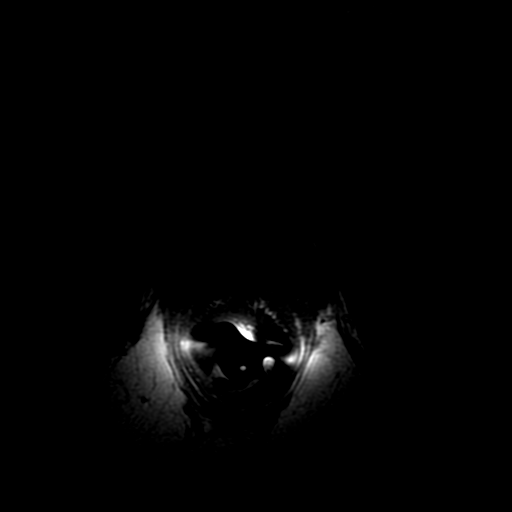

[Series 7: T2 · sagittal · 4.0mm · 0.29mm/px · 1 of 20 slices shown (2 of 3)]
[im 1/20]
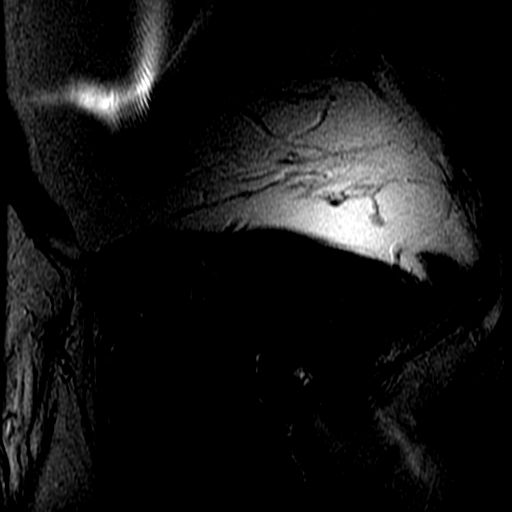

[Series 8: T2 · coronal · 4.0mm · 0.29mm/px · 1 of 18 slices shown (3 of 3)]
[im 1/18]
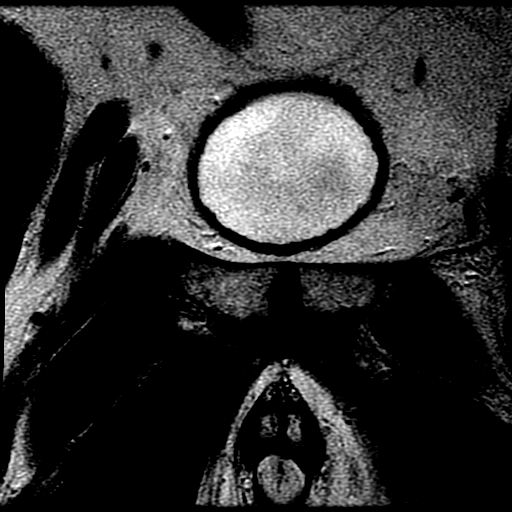

[Series 9: DWI · axial · 3.0mm · 0.59mm/px · 1 of 47 slices shown (1 of 6)]
[im 1/47]
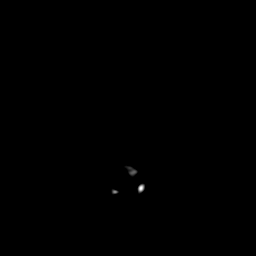

[Series 10: DWI · axial · 3.0mm · 0.59mm/px · 1 of 48 slices shown (2 of 6)]
[im 1/48]
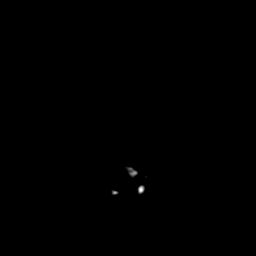

[Series 11: DWI · axial · 3.0mm · 0.59mm/px · 1 of 48 slices shown (3 of 6)]
[im 1/48]
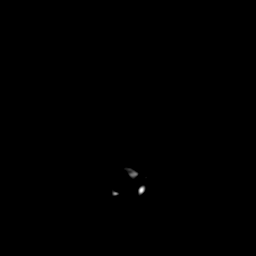

[Series 900: DWI · axial · 3.0mm · 0.59mm/px · 1 of 24 slices shown (4 of 6)]
[im 1/24]
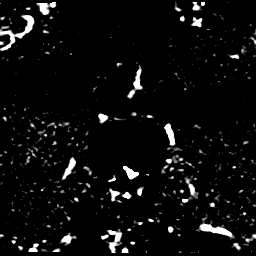

[Series 1000: DWI · axial · 3.0mm · 0.59mm/px · 1 of 24 slices shown (5 of 6)]
[im 1/24]
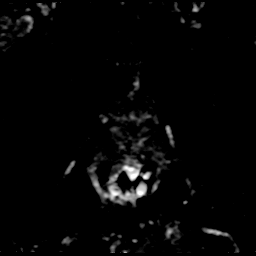

[Series 1100: DWI · axial · 3.0mm · 0.59mm/px · 1 of 24 slices shown (6 of 6)]
[im 1/24]
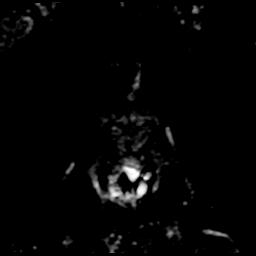

[((id)/(id)/1)-((id)/(id)/1) · axial · 3.0mm · 0.43mm/px · 1 of 63 slices shown (1 of 11)]
[im 1/63]
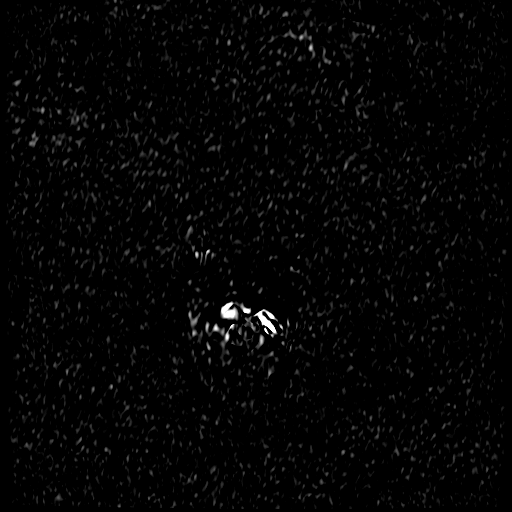

[((id)/(id)/1)-((id)/(id)/1) · axial · 3.0mm · 0.43mm/px · 1 of 64 slices shown (2 of 11)]
[im 1/64]
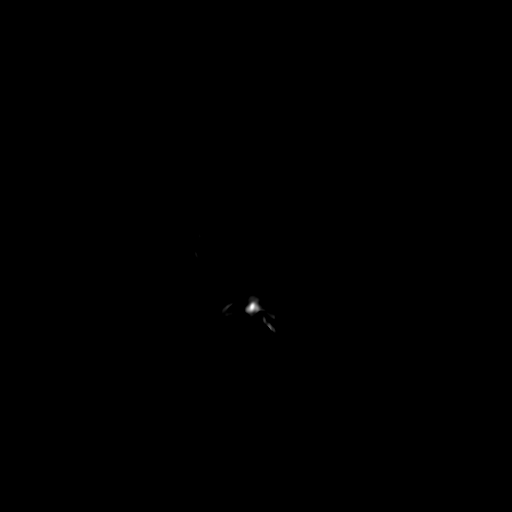

[((id)/(id)/1)-((id)/(id)/1) · axial · 3.0mm · 0.43mm/px · 1 of 64 slices shown (3 of 11)]
[im 1/64]
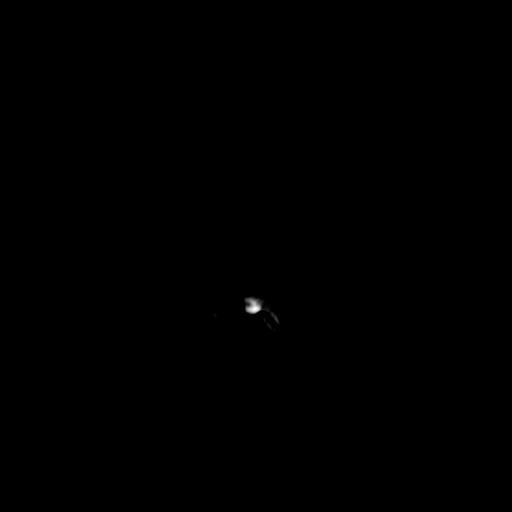

[((id)/(id)/1)-((id)/(id)/1) · axial · 3.0mm · 0.43mm/px · 1 of 64 slices shown (4 of 11)]
[im 1/64]
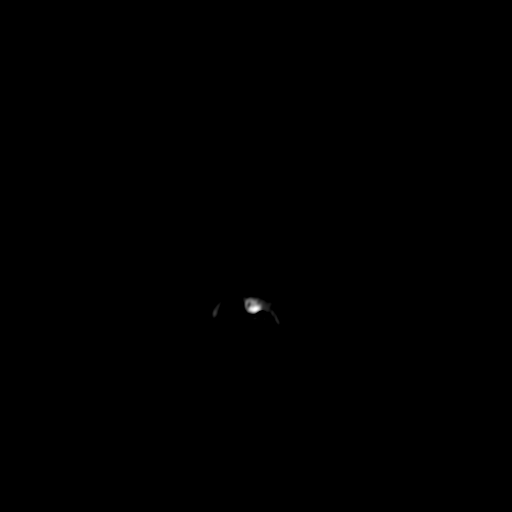

[((id)/(id)/1)-((id)/(id)/1) · axial · 3.0mm · 0.43mm/px · 1 of 64 slices shown (5 of 11)]
[im 1/64]
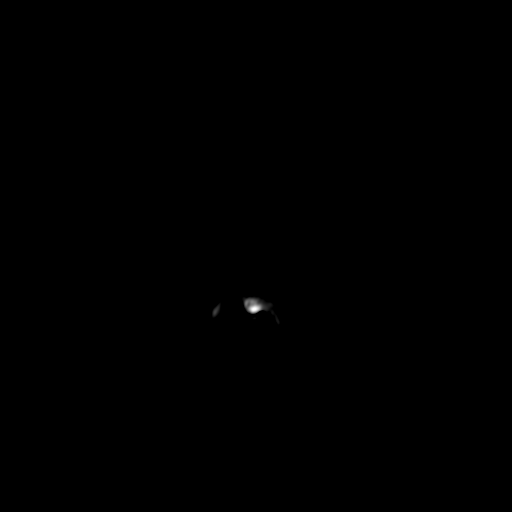

[((id)/(id)/1)-((id)/(id)/1) · axial · 3.0mm · 0.43mm/px · 1 of 64 slices shown (6 of 11)]
[im 1/64]
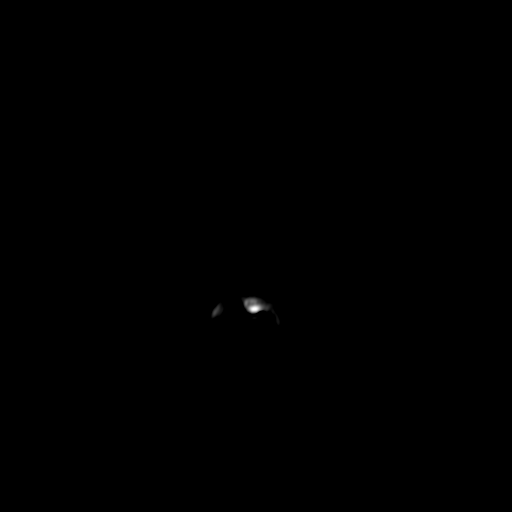

[((id)/(id)/1)-((id)/(id)/1) · axial · 3.0mm · 0.43mm/px · 1 of 64 slices shown (7 of 11)]
[im 1/64]
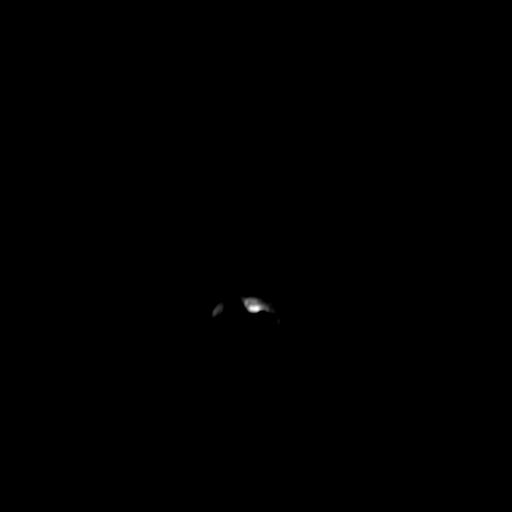

[((id)/(id)/1)-((id)/(id)/1) · axial · 3.0mm · 0.43mm/px · 1 of 64 slices shown (8 of 11)]
[im 1/64]
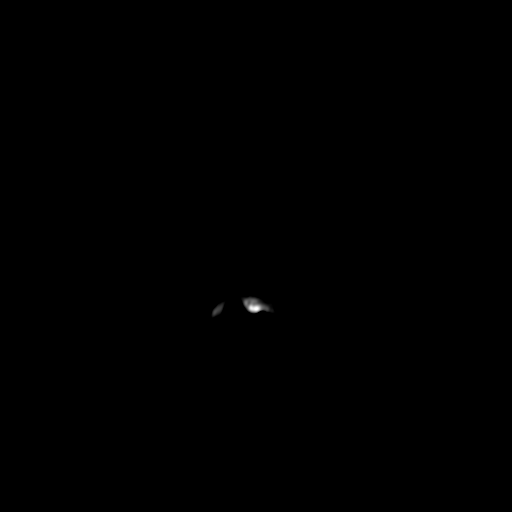

[((id)/(id)/1)-((id)/(id)/1) · axial · 3.0mm · 0.43mm/px · 1 of 64 slices shown (9 of 11)]
[im 1/64]
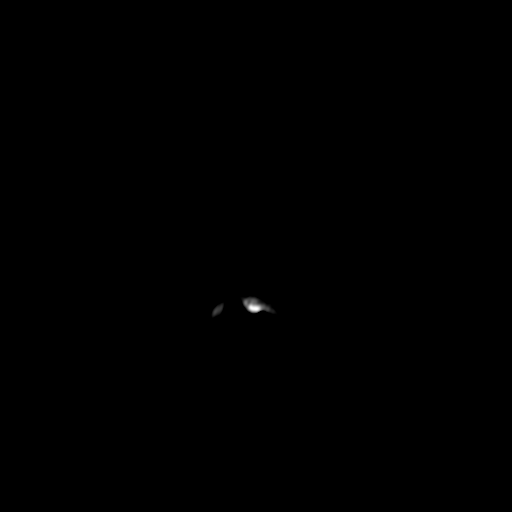

[((id)/(id)/1)-((id)/(id)/1) · axial · 3.0mm · 0.43mm/px · 1 of 64 slices shown (10 of 11)]
[im 1/64]
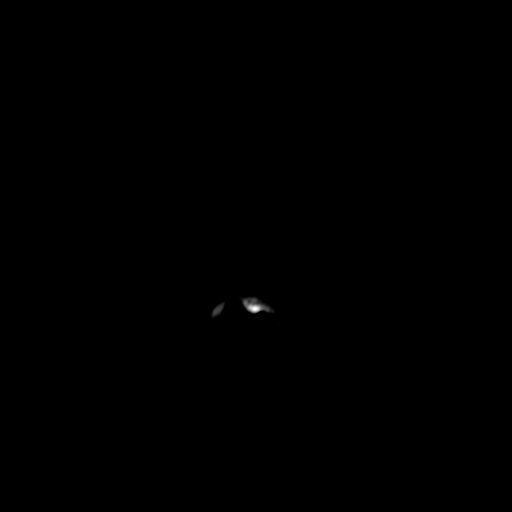

[((id)/(id)/1)-((id)/(id)/1) · axial · 3.0mm · 0.43mm/px · 1 of 64 slices shown (11 of 11)]
[im 1/64]
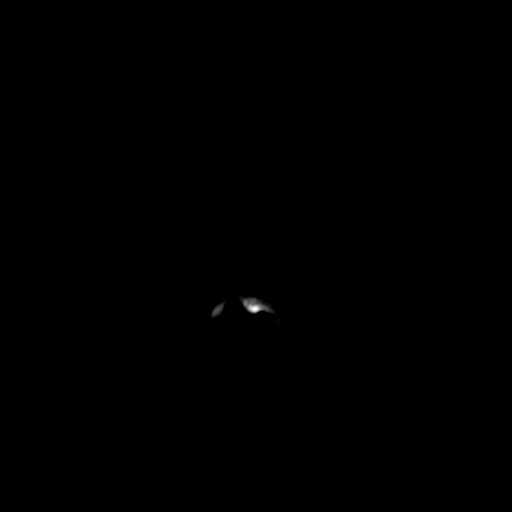

[23 of 53 positions shown; findings below may reference images not displayed]

FINDINGS: Prostate: Mildly enlarged, with multiple BPH nodules seen within the
central gland.

An ill-defined T2 hypointense nodule is seen in the right anterior
mid gland and apex, which involves both the peripheral and central
zones, which measures approximately 1.5 cm on image 18 of series 5.
This shows restricted diffusion and hypervascularity on dynamic
imaging, highly suspicious for high-grade prostate carcinoma.

Transcapsular spread:  Absent

Seminal vesicle involvement: Absent

Neurovascular bundle involvement: Absent

Pelvic adenopathy: Absent

Bone metastasis: Absent

Other findings: None
IMPRESSION: 1.5 cm ill-defined nodule in the right anterior mid gland and apex,
involving both the peripheral and central zones, suspicious for high
grade carcinoma.

No evidence of extracapsular extension, seminal vesicle involvement,
or pelvic metastatic disease.

## 2016-09-12 ENCOUNTER — Encounter: Payer: Self-pay | Admitting: Gastroenterology

## 2016-10-25 ENCOUNTER — Ambulatory Visit (AMBULATORY_SURGERY_CENTER): Payer: Self-pay | Admitting: *Deleted

## 2016-10-25 VITALS — Ht 69.0 in | Wt 210.0 lb

## 2016-10-25 DIAGNOSIS — Z86010 Personal history of colonic polyps: Secondary | ICD-10-CM

## 2016-10-25 MED ORDER — NA SULFATE-K SULFATE-MG SULF 17.5-3.13-1.6 GM/177ML PO SOLN: 1.0000 | Freq: Once | ORAL | 0 refills | Status: AC

## 2016-10-25 NOTE — Progress Notes (Signed)
No egg or soy allergy known to patient  No issues with past sedation with any surgeries  or procedures, no intubation problems  No diet pills per patient No home 02 use per patient  No blood thinners per patient  Pt denies issues with constipation  No A fib or A flutter  EMMI video not sent as pt states he doesn't need this video

## 2016-10-26 ENCOUNTER — Encounter: Payer: Self-pay | Admitting: Gastroenterology

## 2016-11-08 ENCOUNTER — Ambulatory Visit (AMBULATORY_SURGERY_CENTER): Payer: Medicare Other | Admitting: Gastroenterology

## 2016-11-08 ENCOUNTER — Encounter: Payer: Self-pay | Admitting: Gastroenterology

## 2016-11-08 VITALS — BP 114/74 | HR 58 | Temp 98.4°F | Resp 11 | Ht 69.0 in | Wt 214.0 lb

## 2016-11-08 DIAGNOSIS — D123 Benign neoplasm of transverse colon: Secondary | ICD-10-CM

## 2016-11-08 DIAGNOSIS — D124 Benign neoplasm of descending colon: Secondary | ICD-10-CM

## 2016-11-08 DIAGNOSIS — Z86010 Personal history of colonic polyps: Secondary | ICD-10-CM

## 2016-11-08 MED ORDER — SODIUM CHLORIDE 0.9 % IV SOLN: 500.0000 mL | INTRAVENOUS | Status: AC

## 2016-11-08 NOTE — Progress Notes (Signed)
Alert and oriented x3, pleased with MAC, report to RN  

## 2016-11-08 NOTE — Progress Notes (Signed)
No changes in medical or surgical hx since PV per pt 

## 2016-11-08 NOTE — Patient Instructions (Signed)
YOU HAD AN ENDOSCOPIC PROCEDURE TODAY AT THE [facility]:   Refer to the procedure report that was given to you for any specific questions about what was found during the examination.  If the procedure report does not answer your questions, please call your gastroenterologist to clarify.  If you requested that your care partner not be given the details of your procedure findings, then the procedure report has been included in a sealed envelope for you to review at your convenience later.  YOU SHOULD EXPECT: Some feelings of bloating in the abdomen. Passage of more gas than usual.  Walking can help get rid of the air that was put into your GI tract during the procedure and reduce the bloating. If you had a lower endoscopy (such as a colonoscopy or flexible sigmoidoscopy) you may notice spotting of blood in your stool or on the toilet paper. If you underwent a bowel prep for your procedure, you may not have a normal bowel movement for a few days.  Please Note:  You might notice some irritation and congestion in your nose or some drainage.  This is from the oxygen used during your procedure.  There is no need for concern and it should clear up in a day or so.  SYMPTOMS TO REPORT IMMEDIATELY:   Following lower endoscopy (colonoscopy or flexible sigmoidoscopy):  Excessive amounts of blood in the stool  Significant tenderness or worsening of abdominal pains  Swelling of the abdomen that is new, acute  Fever of 100F or higher    For urgent or emergent issues, a gastroenterologist can be reached at any hour by calling [phone].   DIET:  We do recommend a small meal at first, but then you may proceed to your regular diet.  Drink plenty of fluids but you should avoid alcoholic beverages for 24 hours.  ACTIVITY:  You should plan to take it easy for the rest of today and you should NOT DRIVE or use heavy machinery until tomorrow (because of the sedation medicines used during the test).     FOLLOW UP: Our staff will call the number listed on your records the next business day following your procedure to check on you and address any questions or concerns that you may have regarding the information given to you following your procedure. If we do not reach you, we will leave a message.  However, if you are feeling well and you are not experiencing any problems, there is no need to return our call.  We will assume that you have returned to your regular daily activities without incident.  If any biopsies were taken you will be contacted by phone or by letter within the next 1-3 weeks.  Please call us at [phone] if you have not heard about the biopsies in 3 weeks.    SIGNATURES/CONFIDENTIALITY: You and/or your care partner have signed paperwork which will be entered into your electronic medical record.  These signatures attest to the fact that that the information above on your After Visit Summary has been reviewed and is understood.  Full responsibility of the confidentiality of this discharge information lies with you and/or your care-partner.   Resume medications. Information given on polyps and diverticulosis. 

## 2016-11-08 NOTE — Op Note (Signed)
[facility] Patient Name: Mark [name] Procedure Date: 11/08/2016 1:21 PM MRN: [id number] Endoscopist: Mallie Mussel L. [name] , MD Age: 76 Referring MD:  Date of Birth: 1940-09-21 Gender: Male Account #: [id number] Procedure:                Colonoscopy Indications:              Surveillance: Personal history of adenomatous                            polyps on last colonoscopy > 5 years ago (38mm TA                            05/2011) Medicines:                Monitored Anesthesia Care Procedure:                Pre-Anesthesia Assessment:                           - Prior to the procedure, a History and Physical                            was performed, and patient medications and                            allergies were reviewed. The patient's tolerance of                            previous anesthesia was also reviewed. The risks                            and benefits of the procedure and the sedation                            options and risks were discussed with the patient.                            All questions were answered, and informed consent                            was obtained. Anticoagulants: The patient has taken                            aspirin. It was decided not to withhold this                            medication prior to the procedure. ASA Grade                            Assessment: III - A patient with severe systemic                            disease. After reviewing the risks and benefits,  the patient was deemed in satisfactory condition to                            undergo the procedure.                           After obtaining informed consent, the colonoscope                            was passed under direct vision. Throughout the                            procedure, the patient's blood pressure, pulse, and                            oxygen saturations were monitored continuously. The   Model CF-HQ190L [id number]) scope was introduced                            through the anus and advanced to the the cecum,                            identified by appendiceal orifice and ileocecal                            valve. The colonoscopy was performed without                            difficulty. The patient tolerated the procedure                            well. The quality of the bowel preparation was                            good. The ileocecal valve, appendiceal orifice, and                            rectum were photographed. The quality of the bowel                            preparation was evaluated using the BBPS [facility]                            Bowel Preparation Scale) with scores of: Right                            Colon = 2, Transverse Colon = 2 and Left Colon = 2.                            The total BBPS score equals 6. The bowel                            preparation used was SUPREP. Scope In: 1:34:45 PM Scope Out: 1:46:52 PM Scope  Withdrawal Time: 0 hours 10 minutes 27 seconds  Total Procedure Duration: 0 hours 12 minutes 7 seconds  Findings:                 The perianal and digital rectal examinations were                            normal.                           A few small-mouthed diverticula were found in the                            left colon and right colon.                           A 2 mm polyp was found in the descending colon. The                            polyp was sessile. The polyp was removed with a                            cold snare. Resection and retrieval were complete.                           The exam was otherwise without abnormality on                            direct and retroflexion views. Complications:            No immediate complications. Estimated Blood Loss:     Estimated blood loss: none. Impression:               - Diverticulosis in the left colon and in the right                            colon.                            - One 2 mm polyp in the descending colon, removed                            with a cold snare. Resected and retrieved.                           - The examination was otherwise normal on direct                            and retroflexion views. Recommendation:           - Patient has a contact number available for                            emergencies. The signs and symptoms of potential  delayed complications were discussed with the                            patient. Return to normal activities tomorrow.                            Written discharge instructions were provided to the                            patient.                           - Resume previous diet.                           - Continue present medications.                           - Await pathology results.                           - No repeat surveillance colonoscopy due to age. [name] L. [name], MD 11/08/2016 1:54:41 PM This report has been signed electronically.

## 2016-11-08 NOTE — Progress Notes (Signed)
Called to room to assist during endoscopic procedure.  Patient ID and intended procedure confirmed with present staff. Received instructions for my participation in the procedure from the performing physician.  

## 2016-11-09 ENCOUNTER — Telehealth: Payer: Self-pay | Admitting: *Deleted

## 2016-11-09 NOTE — Telephone Encounter (Signed)
  Follow up Call-  Call back number 11/08/2016  Post procedure Call Back phone  # [phone]  Permission to leave phone message Yes  Some recent data might be hidden     Patient questions:  Do you have a fever, pain , or abdominal swelling? No. Pain Score  0 *  Have you tolerated food without any problems? Yes.    Have you been able to return to your normal activities? Yes.    Do you have any questions about your discharge instructions: Diet   No. Medications  No. Follow up visit  No.  Do you have questions or concerns about your Care? No.  Actions: * If pain score is 4 or above: No action needed, pain <4.

## 2016-11-14 ENCOUNTER — Encounter: Payer: Self-pay | Admitting: Gastroenterology

## 2017-03-12 ENCOUNTER — Telehealth: Payer: Self-pay | Admitting: Gastroenterology

## 2017-03-12 NOTE — Telephone Encounter (Signed)
Routed to Dr. [name], will bring notes to [facility] for Dr. [name] review. I do not see where there are the lab results included, only office visit.

## 2017-03-12 NOTE — Telephone Encounter (Signed)
Received urgent referral for pt to be seen for dark stool and possible gi bleed. Dr.Danis pt last seen 11-2016. Referral placed on Dr.Danis' nurse; Julie's desk to advise on scheduling.

## 2017-03-12 NOTE — Telephone Encounter (Signed)
Left message on mobile vm for patient to call back and schedule. Referral in referral box

## 2017-03-12 NOTE — Telephone Encounter (Signed)
Per Dr. [name] please schedule with APP this week. Thank you.

## 2017-03-12 NOTE — Telephone Encounter (Signed)
Called [facility], [phone], requested that lab results be sent ASAP to Attn: Dr. [name]. Once he has this information can advise on plan. There is some available APP appointments this week.

## 2017-03-13 NOTE — Telephone Encounter (Signed)
Please obtain the primary care blood work, especially CBC this week.  Then have him seen by one of our advanced practitioners this week.

## 2017-03-14 ENCOUNTER — Encounter: Payer: Self-pay | Admitting: Physician Assistant

## 2017-03-14 ENCOUNTER — Ambulatory Visit: Payer: Medicare Other | Admitting: Physician Assistant

## 2017-03-14 VITALS — BP 122/70 | HR 76 | Ht 67.25 in | Wt 207.1 lb

## 2017-03-14 DIAGNOSIS — K921 Melena: Secondary | ICD-10-CM | POA: Diagnosis not present

## 2017-03-14 DIAGNOSIS — R1084 Generalized abdominal pain: Secondary | ICD-10-CM | POA: Diagnosis not present

## 2017-03-14 DIAGNOSIS — R112 Nausea with vomiting, unspecified: Secondary | ICD-10-CM | POA: Diagnosis not present

## 2017-03-14 DIAGNOSIS — D509 Iron deficiency anemia, unspecified: Secondary | ICD-10-CM

## 2017-03-14 DIAGNOSIS — R1013 Epigastric pain: Secondary | ICD-10-CM

## 2017-03-14 NOTE — Patient Instructions (Signed)
Continue Iron.  Continue Omeprazole 20 mg daily.   Stay off Naproxen.   Ok to continue Aspirin 81 mg daily.   You have been scheduled for an endoscopy. Please follow written instructions given to you at your visit today. If you use inhalers (even only as needed), please bring them with you on the day of your procedure. Your physician has requested that you go to www.startemmi.com and enter the access code given to you at your visit today. This web site gives a general overview about your procedure. However, you should still follow specific instructions given to you by our office regarding your preparation for the procedure.

## 2017-03-14 NOTE — Progress Notes (Signed)
Thank you for sending this case to me. I have reviewed the entire note, and the outlined plan seems appropriate.  Thank you for seeing my patient, as I did not have an available appointment soon. It is not clear how much bleeding he was really having, despite reported character of his stool.  Hgb normal. EGD reasonable to rule out PUD.   Wilfrid Lund, MD

## 2017-03-14 NOTE — Progress Notes (Signed)
Chief Complaint: "Dark stools"  HPI:   Mark [name] is a 76 y/o Caucasian male with a past medical history as listed below, who was referred to me by Shirline Frees, MD for a complaint of dark stools .      Patient has followed with Dr. [name] in the past.  He was last seen 11/08/16 for a colonoscopy.  Colonoscopy showed diverticulosis in the left colon and in the right colon.  One 2 mm polyp in the descending colon.  Pathology showed a tubular adenoma.  Exam was otherwise normal.    Patient had recent labs completed 03/12/17 with a hemoglobin minimally decreased at 12.5, MCV decreased at 77.4 and otherwise normal CBC.  CMP was normal.  CRP was normal.  Patient saw PCP 03/12/18 and described abdominal pain which had started a week prior in his epigastrium.  He rated this as a 4/10.  He also described increasing fatigue.  Patient was told to stop his Naproxen which he was taking 500 mg as needed.  He was also told to decrease his aspirin to 81 mg to once daily instead of twice daily.  Patient was also started on Omeprazole 20 mg once daily.    Today, the patient explains that about 2 or 3 days after Thanksgiving he caught a "stomach bug".  He was vomiting aggressively, "projectile" for about 12 hours throughout the night and about a day or 2 after this vomiting episode, he started with black stools.  Patient tells me these were black and sticky in nature and he would have one bowel movement a day, this lasted for about 4 days and about 2 days ago this "started clearing".  Patient tells me now he is almost back to his normal color of stool, a light brown.  Patient did see his PCP in the interim as above and was started on iron which he has continued as well as Omeprazole 20 mg once daily.  Patient has also remained off of his Naproxen which he had been using 500 mg daily for the past 3 years for a chronic ankle problem.  He has also decreased his aspirin down to 81 mg once daily instead of twice daily which he  had been doing for the past year, for his CAD.  Associated symptoms include a mild epigastric discomfort during this time which is also improving.    Patient denies fever, chills, bright red blood in his stool weight loss, continued nausea, vomiting, dysphagia, heartburn, reflux or symptoms that awaken him at night.  Past Medical History:  Diagnosis Date  . Allergy   . Anxiety   . Arthritis   . CAD (coronary artery disease) 2002   stent placement  . Cataract    removed left eye   . Depression   . Environmental allergies   . Esophageal reflux    infrequent - no meds  . History of skin cancer   . Hyperlipidemia   . Hypertension   . Macular degeneration   . 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 Prostate cancer ([place]) 06/17/13   gleason 3+4=7, vol 57.52 cc  . Prostate cancer ([place])   . Sciatica   . Sleep apnea    no longer needs c- pap per sleep study per pt    Past Surgical History:  Procedure Laterality Date  . ANGIOPLASTY  2002   stent  placement  . [place], 2002, 2005   lumbar fusion with hardware  . COLONOSCOPY    . [place]   left  . LYMPHADENECTOMY Bilateral 02/05/2014   Procedure: LYMPHADENECTOMY;  Surgeon: Raynelle Bring, MD;  Location: [facility];  Service: Urology;  Laterality: Bilateral;  . POLYPECTOMY    . PROSTATE BIOPSY  06/17/13   gleason 7  . ROBOT ASSISTED LAPAROSCOPIC RADICAL PROSTATECTOMY N/A 02/05/2014   Procedure: ROBOTIC ASSISTED LAPAROSCOPIC RADICAL PROSTATECTOMY LEVEL 2;  Surgeon: Raynelle Bring, MD;  Location: [facility];  Service: Urology;  Laterality: N/A;    Current Outpatient Medications  Medication Sig Dispense Refill  . aspirin EC 81 MG tablet Take 81 mg by mouth every morning.    Marland Kitchen atorvastatin (LIPITOR) 80 MG tablet Take 40 mg by mouth at bedtime. 1/2 TAB DAILY    . Calcium Carbonate-Vitamin D (CALCIUM 600+D PO) Take 1 capsule by  mouth daily.    . Cyanocobalamin (VITAMIN B 12 PO) Take by mouth daily.    Marland Kitchen docusate sodium (COLACE) 100 MG capsule Take 100 mg by mouth once as needed for mild constipation.     . Homeopathic Products (ALLERGY MEDICINE PO) Take by mouth daily. 2-4 daily as needed for allergies    . hydrochlorothiazide (HYDRODIURIL) 25 MG tablet Take 25 mg by mouth every morning.    Marland Kitchen HYDROcodone-acetaminophen (NORCO/VICODIN) 5-325 MG per tablet Take 1-2 tablets by mouth every 6 (six) hours as needed for moderate pain. 30 tablet 0  . Iodoquinol-HC (HYDROCORTISONE-IODOQUINOL) 1-1 % CREA Apply 1 application topically daily as needed (rash).    . Multiple Vitamins-Minerals (PRESERVISION AREDS 2 PO) Take 2 capsules by mouth daily.    . naproxen (NAPROSYN) 500 MG tablet Take 500 mg by mouth 2 (two) times daily with a meal.    . nitroGLYCERIN (NITROSTAT) 0.4 MG SL tablet Place 0.4 mg under the tongue every 5 (five) minutes as needed for chest pain.    . Omega-3 Fatty Acids (FISH OIL) 1000 MG CAPS Take 1 capsule by mouth daily.    . polycarbophil (FIBERCON) 625 MG tablet Take 1,250 mg by mouth 2 (two) times daily.     . potassium chloride SA (K-DUR,KLOR-CON) 20 MEQ tablet Take 40 mEq by mouth every morning.    . Tetrahydrozoline HCl (VISINE OP) Place 1 drop into both eyes once as needed (dry eyes.).    Marland Kitchen valsartan (DIOVAN) 160 MG tablet Take 160 mg by mouth every morning.     Current Facility-Administered Medications  Medication Dose Route Frequency Provider Last Rate Last Dose  . 0.9 %  sodium chloride infusion  500 mL Intravenous Continuous Doran Stabler, MD        Allergies as of 03/14/2017 - Review Complete 11/08/2016  Allergen Reaction Noted  . Codeine Itching 08/13/2012  . Percocet [oxycodone-acetaminophen] Itching 05/22/2011    Family History  Problem Relation Age of Onset  . COPD Mother   . Diabetes Mother   . Hypertension Mother   . Heart disease Mother   . Breast cancer Mother   . Thyroid  disease Mother   . Lung cancer Father   . CAD Brother 6       early  . Lung cancer Brother   . Hypertension Brother   . Hypertension Sister   . Diabetes Brother   . Heart attack Brother 73  . Colon cancer Neg Hx   . Stomach cancer Neg Hx   . Colon polyps  Neg Hx   . Esophageal cancer Neg Hx   . Rectal cancer Neg Hx     Social History   Socioeconomic History  . Marital status: Married    Spouse name: Not on file  . Number of children: 2  . Years of education: Not on file  . Highest education level: Not on file  Social Needs  . Financial resource strain: Not on file  . Food insecurity - worry: Not on file  . Food insecurity - inability: Not on file  . Transportation needs - medical: Not on file  . Transportation needs - non-medical: Not on file  Occupational History  . Not on file  Tobacco Use  . Smoking status: Former Smoker    Packs/day: 0.50    Years: 20.00    Pack years: 10.00    Types: Cigarettes    Last attempt to quit: 05/21/1978    Years since quitting: 38.8  . Smokeless tobacco: Never Used  Substance and Sexual Activity  . Alcohol use: Yes    Alcohol/week: 0.6 oz    Types: 1 Glasses of wine per week    Comment: occasional  . Drug use: No  . Sexual activity: Not on file  Other Topics Concern  . Not on file  Social History Narrative  . Not on file    Review of Systems:    Constitutional: No weight loss, fever or chills Skin: No rash  Cardiovascular: No chest pain   Respiratory: No SOB  Gastrointestinal: See HPI and otherwise negative   Physical Exam:  Vital signs: BP 122/70 (BP Location: Left Arm, Patient Position: Sitting, Cuff Size: Normal)   Pulse 76   Ht 5' 7.25" (1.708 m) Comment: height measured without shoes  Wt 207 lb 2 oz (94 kg)   BMI 32.20 kg/m    Constitutional:   Pleasant Caucasian male appears to be in NAD, Well developed, Well nourished, alert and cooperative Head:  Normocephalic and atraumatic. Eyes:   PEERL, EOMI. No  icterus. Conjunctiva pink. Ears:  Normal auditory acuity. Neck:  Supple Throat: Oral cavity and pharynx without inflammation, swelling or lesion.  Respiratory: Respirations even and unlabored. Lungs clear to auscultation bilaterally.   No wheezes, crackles, or rhonchi.  Cardiovascular: Normal S1, S2. No MRG. Regular rate and rhythm. No peripheral edema, cyanosis or pallor.  Gastrointestinal:  Soft, nondistended, nontender. No rebound or guarding. Normal bowel sounds. No appreciable masses or hepatomegaly. Rectal:  Not performed.  Msk:  Symmetrical without gross deformities. Without edema, no deformity or joint abnormality.  Neurologic:  Alert and  oriented x4;  grossly normal neurologically.  Skin:   Dry and intact without significant lesions or rashes. Psychiatric: Demonstrates good judgement and reason without abnormal affect or behaviors.  See HPI for recent labs.   Assessment: 1.  Nausea and vomiting: Patient describes 12-hour period of nausea and "forceful" vomiting after Thanksgiving; likely this was viral 2.  Melena: For 4 days, one bowel movement per day, now clearing after starting Omeprazole 20 mg daily, decreasing aspirin to 81 mg daily and stopping naproxen; concern for PUD from NSAID use versus Mallory-Weiss tear from above versus other 3.  Abdominal pain: Likely related to above, now resolving  Plan: 1.  Scheduled patient for an EGD in the [place] with Dr. [name] at his next available time.  Did discuss risks, benefits, limitations and alternatives and patient agrees to proceed. 2.  Patient to continue Omeprazole 20 mg once daily, 30-60 minutes before eating breakfast.  3.  Patient to remain off of his Naproxen, as well as other NSAIDs. 4.  Patient may continue his iron daily. 5.  Patient to continue his Aspirin 81 mg daily 6.  Patient to follow in clinic per recommendations from Dr. [name] after time of procedure.  Ellouise Newer, PA-C [facility] 03/14/2017, 3:02  PM  Cc: Shirline Frees, MD

## 2017-03-19 ENCOUNTER — Encounter: Payer: Self-pay | Admitting: Gastroenterology

## 2017-03-26 ENCOUNTER — Encounter: Payer: Self-pay | Admitting: Gastroenterology

## 2017-03-26 ENCOUNTER — Ambulatory Visit (AMBULATORY_SURGERY_CENTER): Payer: Medicare Other | Admitting: Gastroenterology

## 2017-03-26 VITALS — BP 113/74 | HR 68 | Temp 96.8°F | Resp 17 | Ht 67.0 in | Wt 207.0 lb

## 2017-03-26 DIAGNOSIS — R1013 Epigastric pain: Secondary | ICD-10-CM | POA: Diagnosis present

## 2017-03-26 DIAGNOSIS — K2950 Unspecified chronic gastritis without bleeding: Secondary | ICD-10-CM | POA: Diagnosis not present

## 2017-03-26 DIAGNOSIS — K319 Disease of stomach and duodenum, unspecified: Secondary | ICD-10-CM

## 2017-03-26 MED ORDER — SODIUM CHLORIDE 0.9 % IV SOLN: 500.0000 mL | INTRAVENOUS | Status: DC

## 2017-03-26 NOTE — Progress Notes (Signed)
Report given to PACU, vss 

## 2017-03-26 NOTE — Progress Notes (Signed)
Called to room to assist during endoscopic procedure.  Patient ID and intended procedure confirmed with present staff. Received instructions for my participation in the procedure from the performing physician.  

## 2017-03-26 NOTE — Patient Instructions (Signed)
YOU HAD AN ENDOSCOPIC PROCEDURE TODAY AT [facility]:   Refer to the procedure report that was given to you for any specific questions about what was found during the examination.  If the procedure report does not answer your questions, please call your gastroenterologist to clarify.  If you requested that your care partner not be given the details of your procedure findings, then the procedure report has been included in a sealed envelope for you to review at your convenience later.  YOU SHOULD EXPECT: Some feelings of bloating in the abdomen. Passage of more gas than usual.  Walking can help get rid of the air that was put into your GI tract during the procedure and reduce the bloating. If you had a lower endoscopy (such as a colonoscopy or flexible sigmoidoscopy) you may notice spotting of blood in your stool or on the toilet paper. If you underwent a bowel prep for your procedure, you may not have a normal bowel movement for a few days.  Please Note:  You might notice some irritation and congestion in your nose or some drainage.  This is from the oxygen used during your procedure.  There is no need for concern and it should clear up in a day or so.  SYMPTOMS TO REPORT IMMEDIATELY:   Following upper endoscopy (EGD)  Vomiting of blood or coffee ground material  New chest pain or pain under the shoulder blades  Painful or persistently difficult swallowing  New shortness of breath  Fever of 100F or higher  Black, tarry-looking stools  For urgent or emergent issues, a gastroenterologist can be reached at any hour by calling [phone].  Please read handout on gastritis give to you by recovery nurse. Continue your 81 mg aspirin. Use caution when taking NSAIDS like ibuprofen or Motrin.   DIET:  We do recommend a small meal at first, but then you may proceed to your regular diet.  Drink plenty of fluids but you should avoid alcoholic beverages for 24 hours.  ACTIVITY:  You  should plan to take it easy for the rest of today and you should NOT DRIVE or use heavy machinery until tomorrow (because of the sedation medicines used during the test).    FOLLOW UP: Our staff will call the number listed on your records the next business day following your procedure to check on you and address any questions or concerns that you may have regarding the information given to you following your procedure. If we do not reach you, we will leave a message.  However, if you are feeling well and you are not experiencing any problems, there is no need to return our call.  We will assume that you have returned to your regular daily activities without incident.  If any biopsies were taken you will be contacted by phone or by letter within the next 1-3 weeks.  Please call us at [phone] if you have not heard about the biopsies in 3 weeks.    SIGNATURES/CONFIDENTIALITY: You and/or your care partner have signed paperwork which will be entered into your electronic medical record.  These signatures attest to the fact that that the information above on your After Visit Summary has been reviewed and is understood.  Full responsibility of the confidentiality of this discharge information lies with you and/or your care-partner.  Thank you for choosing [place] for your health care needs today.

## 2017-03-26 NOTE — Op Note (Signed)
[place] Patient Name: Mark [name] Procedure Date: 03/26/2017 3:08 PM MRN: [id number] Endoscopist: Mallie Mussel L. [name] , MD Age: 76 Referring MD:  Date of Birth: 28-May-1940 Gender: Male Account #: [id number] Procedure:                Upper GI endoscopy Indications:              acute epigastric abdominal pain, "black stool"                            raising concern for GI bleed (hemoglobin normal).                            Patient was taknig regular doses of naproxen Medicines:                Monitored Anesthesia Care Procedure:                Pre-Anesthesia Assessment:                           - Prior to the procedure, a History and Physical                            was performed, and patient medications and                            allergies were reviewed. The patient's tolerance of                            previous anesthesia was also reviewed. The risks                            and benefits of the procedure and the sedation                            options and risks were discussed with the patient.                            All questions were answered, and informed consent                            was obtained. Anticoagulants: The patient has taken                            aspirin. It was decided not to withhold this                            medication prior to the procedure. ASA Grade                            Assessment: III - A patient with severe systemic                            disease. After reviewing the risks and benefits,  the patient was deemed in satisfactory condition to                            undergo the procedure.                           After obtaining informed consent, the endoscope was                            passed under direct vision. Throughout the                            procedure, the patient's blood pressure, pulse, and                            oxygen saturations were monitored  continuously. The                            Endoscope was introduced through the mouth, and                            advanced to the second part of duodenum. The upper                            GI endoscopy was accomplished without difficulty.                            The patient tolerated the procedure well. Scope In: Scope Out: Findings:                 The larynx was normal.                           The esophagus was normal.                           A few diminutive erosions were found in the gastric                            antrum.                           Striped erythematous mucosa was found in the                            gastric antrum. Biopsies were taken with a cold                            forceps for histology. ([facility]ney protocol).                           The cardia and gastric fundus were normal on                            retroflexion.  The examined duodenum was normal. Complications:            No immediate complications. Estimated Blood Loss:     Estimated blood loss was minimal. Impression:               - Normal larynx.                           - Normal esophagus.                           - Erosive gastropathy.                           - Erythematous mucosa in the antrum. Biopsied.                           - Normal examined duodenum. Recommendation:           - Patient has a contact number available for                            emergencies. The signs and symptoms of potential                            delayed complications were discussed with the                            patient. Return to normal activities tomorrow.                            Written discharge instructions were provided to the                            patient.                           - Resume previous diet.                           - Continue present medications, however omeprazole                            three times weekly would be  sufficient.                           - Await pathology results.                           - Although it is not clear that the patient's                            symptoms represented GI bleeding, EGD findings                            warrant caution regarding NSAID use.  OK to continue 81 mg daily aspirin. Caria Transue L. [name], MD 03/26/2017 3:32:02 PM This report has been signed electronically.

## 2017-03-27 ENCOUNTER — Telehealth: Payer: Self-pay | Admitting: *Deleted

## 2017-03-27 ENCOUNTER — Telehealth: Payer: Self-pay

## 2017-03-27 NOTE — Telephone Encounter (Signed)
  Follow up Call-  Call back number 03/26/2017 11/08/2016  Post procedure Call Back phone  # [phone]  Permission to leave phone message Yes Yes  Some recent data might be hidden     Left message

## 2017-03-27 NOTE — Telephone Encounter (Signed)
  Follow up Call-  Call back number 03/26/2017 11/08/2016  Post procedure Call Back phone  # [phone]  Permission to leave phone message Yes Yes  Some recent data might be hidden     Patient questions:  Do you have a fever, pain , or abdominal swelling? No. Pain Score  0 *  Have you tolerated food without any problems? Yes.    Have you been able to return to your normal activities? Yes.    Do you have any questions about your discharge instructions: Diet   No. Medications  No. Follow up visit  No.  Do you have questions or concerns about your Care? No.  Actions: * If pain score is 4 or above: No action needed, pain <4.

## 2017-04-25 ENCOUNTER — Encounter: Payer: Self-pay | Admitting: Cardiovascular Disease

## 2017-04-25 ENCOUNTER — Ambulatory Visit (INDEPENDENT_AMBULATORY_CARE_PROVIDER_SITE_OTHER): Payer: Medicare Other | Admitting: Cardiovascular Disease

## 2017-04-25 VITALS — BP 134/80 | HR 63 | Ht 69.0 in | Wt 211.0 lb

## 2017-04-25 DIAGNOSIS — E782 Mixed hyperlipidemia: Secondary | ICD-10-CM | POA: Diagnosis not present

## 2017-04-25 DIAGNOSIS — I2510 Atherosclerotic heart disease of native coronary artery without angina pectoris: Secondary | ICD-10-CM | POA: Diagnosis not present

## 2017-04-25 NOTE — Progress Notes (Signed)
Veverly Fells Date of Birth  05/24/1940       [place] [address], [place]    [place], [place]  [address]         Problem List: 1. CAD - PCI to LAD - 2002 Linard Millers)  2. Hypertension 3. Hyperlipidemia 4.  Gastritis - mild GI bleeding     Mark [name] was recently admitted to the hospital with some CP.  Stress myoview was negative.   He is back doing his normal activities.   He swims regularly ( 3 times a week)    Dec. 10, 2014:  He has had some back pain  / sciatica.  No angina.   Able to do all of his normal activities without problems.   He is retired from [employer].    Oct. 13, 2015:  Ruford is doing well.  No CP,  On ASA alone  He was diagnosed with prostate cancer in March 2015.     He is off Testosterone supplement.   Now needs surgery.   He has elected to go with the [facility] robotic surgery.    He is splitting his metoprolol in half and taking twice a day.   He swims on a regular basis-perhaps 3 times a week. He swims for about 30-45 minutes and probably as much as one mild each day that he swims. He has no trouble climbing  1-2 flights of stairs. He is somewhat limited by his left ankle injury.  Had a stress Myoview study last year which showed:  IMPRESSION:  1. No scintigraphic evidence of prior infarction or  pharmacologically induced ischemia.  2. Normal wall motion. Ejection fraction - 71%.  Nov. 7, 2016:  Mark [name] is doing great. Swims 3 times a week .  No CP or dyspnea  Had robotic prostate surgery last year.  Is doing well with that .  Nov. 17, 2017:  Mark [name] is seen today for follow up of his CAD, HR is slow today   Tries to avoid salt Has some occasional episodes of orthostasis   Does not check his BP at home .   April 25, 2017: Doing well from a cardiac standpoint.   Has had some gastritis that seems to be related to his naproxyn .  No further GI bleeding . No CP or dyspnea Not getting regular exercise - needs  to get back to his swimming .  Has had some fatigue.  Maybe related to some anemia      Current Outpatient Medications on File Prior to Visit  Medication Sig Dispense Refill  . aspirin EC 81 MG tablet Take 81 mg by mouth every morning.    Marland Kitchen atorvastatin (LIPITOR) 80 MG tablet Take 40 mg by mouth at bedtime. 1/2 TAB DAILY    . Calcium Carbonate-Vitamin D (CALCIUM 600+D PO) Take 1 capsule by mouth daily.    . Cyanocobalamin (VITAMIN B 12 PO) Take by mouth daily.    Marland Kitchen docusate sodium (COLACE) 100 MG capsule Take 100 mg by mouth once as needed for mild constipation.     . ferrous sulfate 325 (65 FE) MG tablet Take 325 mg by mouth daily.    . Homeopathic Products (ALLERGY MEDICINE PO) Take by mouth daily. 2-4 daily as needed for allergies    . hydrochlorothiazide (HYDRODIURIL) 25 MG tablet Take 25 mg by mouth every morning.    Marland Kitchen HYDROcodone-acetaminophen (NORCO/VICODIN) 5-325 MG per tablet  Take 1-2 tablets by mouth every 6 (six) hours as needed for moderate pain. 30 tablet 0  . Iodoquinol-HC (HYDROCORTISONE-IODOQUINOL) 1-1 % CREA Apply 1 application topically daily as needed (rash).    . Multiple Vitamins-Minerals (PRESERVISION AREDS 2 PO) Take 2 capsules by mouth daily.    . nitroGLYCERIN (NITROSTAT) 0.4 MG SL tablet Place 0.4 mg under the tongue every 5 (five) minutes as needed for chest pain.    . Omega-3 Fatty Acids (FISH OIL) 1000 MG CAPS Take 1 capsule by mouth daily.    Marland Kitchen omeprazole (PRILOSEC) 20 MG capsule Take 20 mg by mouth daily.    . polycarbophil (FIBERCON) 625 MG tablet Take 1,250 mg by mouth 2 (two) times daily.     . potassium chloride SA (K-DUR,KLOR-CON) 20 MEQ tablet Take 40 mEq by mouth every morning.    . Tetrahydrozoline HCl (VISINE OP) Place 1 drop into both eyes once as needed (dry eyes.).    Marland Kitchen valsartan (DIOVAN) 160 MG tablet Take 160 mg by mouth every morning.     Current Facility-Administered Medications on File Prior to Visit  Medication Dose Route Frequency Provider  Last Rate Last Dose  . 0.9 %  sodium chloride infusion  500 mL Intravenous Continuous Danis, Henry L III, MD      . 0.9 %  sodium chloride infusion  500 mL Intravenous Continuous Danis, Estill Cotta III, MD      also taking Metoprolol 12.5 BID   Allergies  Allergen Reactions  . Codeine Itching  . Percocet [Oxycodone-Acetaminophen] Itching    Past Medical History:  Diagnosis Date  . Allergy   . Anemia   . Anxiety   . Arthritis   . CAD (coronary artery disease) 2002   stent placement  . Cataract    removed left eye   . Depression   . Environmental allergies   . Esophageal reflux    infrequent - no meds  . History of skin cancer   . Hyperlipidemia   . Hypertension   . Macular degeneration   . 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 Prostate cancer ([place]) 06/17/13   gleason 3+4=7, vol 57.52 cc  . Prostate cancer ([place])   . Sciatica   . Sleep apnea    no longer needs c- pap per sleep study per pt    Past Surgical History:  Procedure Laterality Date  . ANGIOPLASTY  2002   stent placement  . [place], 2002, 2005   lumbar fusion with hardware  . COLONOSCOPY    . [place]   left  . LYMPHADENECTOMY Bilateral 02/05/2014   Procedure: LYMPHADENECTOMY;  Surgeon: Raynelle Bring, MD;  Location: [facility];  Service: Urology;  Laterality: Bilateral;  . POLYPECTOMY    . PROSTATE BIOPSY  06/17/13   gleason 7  . ROBOT ASSISTED LAPAROSCOPIC RADICAL PROSTATECTOMY N/A 02/05/2014   Procedure: ROBOTIC ASSISTED LAPAROSCOPIC RADICAL PROSTATECTOMY LEVEL 2;  Surgeon: Raynelle Bring, MD;  Location: [facility];  Service: Urology;  Laterality: N/A;    Social History   Tobacco Use  Smoking Status Former Smoker  . Packs/day: 0.50  . Years: 20.00  . Pack years: 10.00  . Types: Cigarettes  . Last attempt to quit: 05/21/1978  . Years since quitting: 38.9  Smokeless Tobacco Never Used     Social History   Substance and Sexual  Activity  Alcohol Use Yes  . Alcohol/week: 0.6 oz  . Types: 1 Glasses of wine per week   Comment: occasional    Family History  Problem Relation Age of Onset  . COPD Mother   . Diabetes Mother   . Hypertension Mother   . Heart disease Mother   . Breast cancer Mother   . Thyroid disease Mother   . Lung cancer Father   . CAD Brother 38       early  . Lung cancer Brother   . Hypertension Brother   . Hypertension Sister   . Diabetes Brother   . Heart attack Brother 104  . Colon cancer Neg Hx   . Stomach cancer Neg Hx   . Colon polyps Neg Hx   . Esophageal cancer Neg Hx   . Rectal cancer Neg Hx     Reviw of Systems:  Reviewed in the HPI.  All other systems are negative.  Physical Exam: Blood pressure 134/80, pulse 63, height 5\' 9"  (1.753 m), weight 211 lb (95.7 kg), SpO2 97 %.  GEN:  Well nourished, well developed in no acute distress HEENT: Normal NECK: No JVD; No carotid bruits LYMPHATICS: No lymphadenopathy CARDIAC: RR, normal S1S2 RESPIRATORY:  Clear to auscultation without rales, wheezing or rhonchi  ABDOMEN: Soft, non-tender, non-distended MUSCULOSKELETAL:  No edema; No deformity  SKIN: Warm and dry NEUROLOGIC:  Alert and oriented x 3   ECG: April 25, 2017: Normal sinus rhythm at 63.  Normal EKG.   Assessment / Plan:   1. CAD - PCI to LAD - 2002 Linard Millers)  -  No angina ,   Seems to be doing well  2. Hypertension -   BP is well controlled.  Cont meds.   3. Hyperlipidemia - managed by Dr. Kenton Kingfisher   Will see him in 1 year.   He is [facility] by the [facility]   [name], MD  04/25/2017 10:39 AM    [place],  [place], [place]  [address] Pager [phone] Phone: [phone]; Fax: [phone]    [phone]   Fax [phone]

## 2017-04-25 NOTE — Patient Instructions (Signed)
Medication Instructions:  Your physician recommends that you continue on your current medications as directed. Please refer to the Current Medication list given to you today.   Labwork: None Ordered   Testing/Procedures: None Ordered   Follow-Up: Your physician wants you to follow-up in: 1 year with Dr. Nahser.  You will receive a reminder letter in the mail two months in advance. If you don't receive a letter, please call our office to schedule the follow-up appointment.   If you need a refill on your cardiac medications before your next appointment, please call your pharmacy.   Thank you for choosing [facility]! Rhiana Morash, RN [phone]    

## 2018-04-09 ENCOUNTER — Encounter: Payer: Self-pay | Admitting: Cardiovascular Disease

## 2018-05-06 ENCOUNTER — Ambulatory Visit: Payer: Medicare Other | Admitting: Cardiovascular Disease

## 2018-05-06 ENCOUNTER — Encounter: Payer: Self-pay | Admitting: Cardiovascular Disease

## 2018-05-06 VITALS — BP 134/78 | HR 70 | Ht 69.0 in | Wt 217.8 lb

## 2018-05-06 DIAGNOSIS — E782 Mixed hyperlipidemia: Secondary | ICD-10-CM

## 2018-05-06 DIAGNOSIS — I2510 Atherosclerotic heart disease of native coronary artery without angina pectoris: Secondary | ICD-10-CM

## 2018-05-06 NOTE — Patient Instructions (Signed)
Medication Instructions:  Your physician recommends that you continue on your current medications as directed. Please refer to the Current Medication list given to you today.   Labwork: TODAY: BMET, LFTs, Lipids  Testing/Procedures: None  Follow-Up: Your physician wants you to follow-up in: 1 year with Dr. Acie Fredrickson. You will receive a reminder letter in the mail two months in advance. If you don't receive a letter, please call our office to schedule the follow-up appointment.

## 2018-05-06 NOTE — Progress Notes (Signed)
Mark [name] Date of Birth  [birth year]       [place] [address], [place]    [place], [place]  [address]         Problem List: 1. CAD - PCI to LAD - 2002 Linard Millers)  2. Hypertension 3. Hyperlipidemia 4.  Gastritis - mild GI bleeding     Mark [name] was recently admitted to the hospital with some CP.  Stress myoview was negative.   He is back doing his normal activities.   He swims regularly ( 3 times a week)    Dec. 10, 2014:  He has had some back pain  / sciatica.  No angina.   Able to do all of his normal activities without problems.   He is retired from [employer].    Oct. 13, 2015:  Mark [name] is doing well.  No CP,  On ASA alone  He was diagnosed with prostate cancer in March 2015.     He is off Testosterone supplement.   Now needs surgery.   He has elected to go with the [facility] robotic surgery.    He is splitting his metoprolol in half and taking twice a day.   He swims on a regular basis-perhaps 3 times a week. He swims for about 30-45 minutes and probably as much as one mild each day that he swims. He has no trouble climbing  1-2 flights of stairs. He is somewhat limited by his left ankle injury.  Had a stress Myoview study last year which showed:  IMPRESSION:  1. No scintigraphic evidence of prior infarction or  pharmacologically induced ischemia.  2. Normal wall motion. Ejection fraction - 71%.  Nov. 7, 2016:  Mark [name] is doing great. Swims 3 times a week .  No CP or dyspnea  Had robotic prostate surgery last year.  Is doing well with that .  Nov. 17, 2017:  Mark [name] is seen today for follow up of his CAD, HR is slow today   Tries to avoid salt Has some occasional episodes of orthostasis   Does not check his BP at home .   April 25, 2017: Doing well from a cardiac standpoint.   Has had some gastritis that seems to be related to his naproxyn .  No further GI bleeding . No CP or dyspnea Not getting regular exercise - needs  to get back to his swimming .  Has had some fatigue.  Maybe related to some anemia   Jan. 27, 2019:  Mark [name] is seen today for follow-up of his coronary artery disease. No CP , no dyspnea.   Exercises some,   Has slacked off his swimming now .   Has chronic ankle issues.    Current Outpatient Medications on File Prior to Visit  Medication Sig Dispense Refill  . aspirin EC 81 MG tablet Take 81 mg by mouth every morning.    Marland Kitchen atorvastatin (LIPITOR) 80 MG tablet Take 40 mg by mouth at bedtime. 1/2 TAB DAILY    . Calcium Carbonate-Vitamin D (CALCIUM 600+D PO) Take 1 capsule by mouth daily.    . Cyanocobalamin (VITAMIN B 12 PO) Take by mouth daily.    Marland Kitchen docusate sodium (COLACE) 100 MG capsule Take 100 mg by mouth once as needed for mild constipation.     . ferrous sulfate 325 (65 FE) MG tablet Take 325 mg by mouth daily.    Marland Kitchen gabapentin (NEURONTIN)  300 MG capsule Take 300 mg by mouth daily.    . Homeopathic Products (ALLERGY MEDICINE PO) Take by mouth daily. 2-4 daily as needed for allergies    . hydrochlorothiazide (HYDRODIURIL) 25 MG tablet Take 25 mg by mouth every morning.    Marland Kitchen HYDROcodone-acetaminophen (NORCO/VICODIN) 5-325 MG per tablet Take 1-2 tablets by mouth every 6 (six) hours as needed for moderate pain. 30 tablet 0  . Iodoquinol-HC (HYDROCORTISONE-IODOQUINOL) 1-1 % CREA Apply 1 application topically daily as needed (rash).    . Multiple Vitamins-Minerals (PRESERVISION AREDS 2 PO) Take 2 capsules by mouth daily.    . nitroGLYCERIN (NITROSTAT) 0.4 MG SL tablet Place 0.4 mg under the tongue every 5 (five) minutes as needed for chest pain.    . Omega-3 Fatty Acids (FISH OIL) 1000 MG CAPS Take 1 capsule by mouth daily.    Marland Kitchen omeprazole (PRILOSEC) 20 MG capsule Take 20 mg by mouth daily.    . polycarbophil (FIBERCON) 625 MG tablet Take 1,250 mg by mouth 2 (two) times daily.     . potassium chloride SA (K-DUR,KLOR-CON) 20 MEQ tablet Take 40 mEq by mouth every morning.    . Tetrahydrozoline  HCl (VISINE OP) Place 1 drop into both eyes once as needed (dry eyes.).    Marland Kitchen valsartan (DIOVAN) 160 MG tablet Take 160 mg by mouth every morning.     Current Facility-Administered Medications on File Prior to Visit  Medication Dose Route Frequency Provider Last Rate Last Dose  . 0.9 %  sodium chloride infusion  500 mL Intravenous Continuous Nelida Meuse III, MD      also taking Metoprolol 12.5 BID   Allergies  Allergen Reactions  . Codeine Itching  . Percocet [Oxycodone-Acetaminophen] Itching    Past Medical History:  Diagnosis Date  . Allergy   . Anemia   . Anxiety   . Arthritis   . CAD (coronary artery disease) 2002   stent placement  . Cataract    removed left eye   . Depression   . Environmental allergies   . Esophageal reflux    infrequent - no meds  . History of skin cancer   . Hyperlipidemia   . Hypertension   . Macular degeneration   . 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 Prostate cancer ([place]) 06/17/13   gleason 3+4=7, vol 57.52 cc  . Prostate cancer ([place])   . Sciatica   . Sleep apnea    no longer needs c- pap per sleep study per pt    Past Surgical History:  Procedure Laterality Date  . ANGIOPLASTY  2002   stent placement  . [place], 2002, 2005   lumbar fusion with hardware  . COLONOSCOPY    . [place]   left  . LYMPHADENECTOMY Bilateral 02/05/2014   Procedure: LYMPHADENECTOMY;  Surgeon: Raynelle Bring, MD;  Location: [facility];  Service: Urology;  Laterality: Bilateral;  . POLYPECTOMY    . PROSTATE BIOPSY  06/17/13   gleason 7  . ROBOT ASSISTED LAPAROSCOPIC RADICAL PROSTATECTOMY N/A 02/05/2014   Procedure: ROBOTIC ASSISTED LAPAROSCOPIC RADICAL PROSTATECTOMY LEVEL 2;  Surgeon: Raynelle Bring, MD;  Location: [facility];  Service: Urology;  Laterality: N/A;    Social History   Tobacco Use  Smoking Status Former Smoker  . Packs/day: 0.50    . Years: 20.00  . Pack years:  10.00  . Types: Cigarettes  . Last attempt to quit: 05/21/1978  . Years since quitting: 39.9  Smokeless Tobacco Never Used    Social History   Substance and Sexual Activity  Alcohol Use Yes  . Alcohol/week: 1.0 standard drinks  . Types: 1 Glasses of wine per week   Comment: occasional    Family History  Problem Relation Age of Onset  . COPD Mother   . Diabetes Mother   . Hypertension Mother   . Heart disease Mother   . Breast cancer Mother   . Thyroid disease Mother   . Lung cancer Father   . CAD Brother 74       early  . Lung cancer Brother   . Hypertension Brother   . Hypertension Sister   . Diabetes Brother   . Heart attack Brother 71  . Colon cancer Neg Hx   . Stomach cancer Neg Hx   . Colon polyps Neg Hx   . Esophageal cancer Neg Hx   . Rectal cancer Neg Hx     Reviw of Systems:  Reviewed in the HPI.  All other systems are negative.  Physical Exam: Blood pressure 134/78, pulse 70, height 5\' 9"  (1.753 m), weight 217 lb 12.8 oz (98.8 kg), SpO2 97 %.  GEN:  Elderly male,  NAD  HEENT: Normal NECK: No JVD; No carotid bruits LYMPHATICS: No lymphadenopathy CARDIAC: RRR   RESPIRATORY:  Clear to auscultation without rales, wheezing or rhonchi  ABDOMEN: Soft, non-tender, non-distended MUSCULOSKELETAL:  No edema; No deformity  SKIN: Warm and dry NEUROLOGIC:  Alert and oriented x 3   ECG: May 06, 2018: Sinus bradycardia 58 beats minute.  Normal EKG. Assessment / Plan:   1. CAD - PCI to LAD - 2002 Linard Millers)  -  No angina   2. Hypertension -     BP is well controlled.   3. Hyperlipidemia -   Stable   Will see him in 1 year.      [name], MD  05/06/2018 11:56 AM    [place],  [place], [place]  [address] Pager [phone] Phone: [phone]; Fax: [phone]    [phone]   Fax [phone]

## 2018-05-07 ENCOUNTER — Telehealth: Payer: Self-pay

## 2018-05-07 LAB — LIPID PANEL
Chol/HDL Ratio: 3.2 ratio (ref 0.0–5.0)
Cholesterol, Total: 100 mg/dL (ref 100–199)
HDL: 31 mg/dL — ABNORMAL LOW (ref >39)
LDL Calculated: 30 mg/dL (ref 0–99)
Triglycerides: 196 mg/dL — ABNORMAL HIGH (ref 0–149)
VLDL Cholesterol Cal: 39 mg/dL (ref 5–40)

## 2018-05-07 LAB — BASIC METABOLIC PANEL
BUN/Creatinine Ratio: 17 (ref 10–24)
BUN: 16 mg/dL (ref 8–27)
CO2: 22 mmol/L (ref 20–29)
Calcium: 9 mg/dL (ref 8.6–10.2)
Chloride: 102 mmol/L (ref 96–106)
Creatinine, Ser: 0.96 mg/dL (ref 0.76–1.27)
GFR calc Af Amer: 88 mL/min/{1.73_m2} (ref >59)
GFR, EST NON AFRICAN AMERICAN: 76 mL/min/{1.73_m2} (ref >59)
Glucose: 105 mg/dL — ABNORMAL HIGH (ref 65–99)
POTASSIUM: 3.8 mmol/L (ref 3.5–5.2)
Sodium: 140 mmol/L (ref 134–144)

## 2018-05-07 LAB — HEPATIC FUNCTION PANEL
ALT: 31 IU/L (ref 0–44)
AST: 19 IU/L (ref 0–40)
Albumin: 4.1 g/dL (ref 3.7–4.7)
Alkaline Phosphatase: 98 IU/L (ref 39–117)
Bilirubin Total: 0.8 mg/dL (ref 0.0–1.2)
Bilirubin, Direct: 0.22 mg/dL (ref 0.00–0.40)
Total Protein: 6.1 g/dL (ref 6.0–8.5)

## 2018-05-07 NOTE — Telephone Encounter (Signed)
LMTCB

## 2018-05-07 NOTE — Telephone Encounter (Signed)
-----   Message from Thayer Headings, MD sent at 05/07/2018  7:55 AM EST ----- Please add Fenofibrate 145 mg a day  Check lipids, liver enz, BMP is 3 months

## 2018-05-13 NOTE — Telephone Encounter (Signed)
I agree with the current plan to continue working on diet and exercise and recheck in several months

## 2018-05-13 NOTE — Telephone Encounter (Signed)
Patient states the elevated triglycerides is very surprising to him and he is also concerned about the increase. He states he did eat breakfast that morning which consisted of 2 eggs, grits, and possibly a piece of toast. He denies recent increased intake of carbohydrates and states he has only gained approximately 2 lb in the last year. He reports triglyceride level was 111 in November. I answered questions to his satisfaction and he states if Dr. Acie Fredrickson agrees, he would like to work on better diet and exercise and get lab work rechecked in 2 months before starting fenofibrate. He states he will call back to schedule lab work here in the event it is not done by PCP between now and April. He asked me to call him back if Dr. Acie Fredrickson does not agree with the plan and I assured him that I will. He thanked me for the call.

## 2018-05-13 NOTE — Telephone Encounter (Signed)
Patient returned call for lab results.  

## 2019-03-11 ENCOUNTER — Ambulatory Visit: Payer: Self-pay | Admitting: Surgery

## 2019-03-11 NOTE — H&P (Signed)
Surgical H&P Requesting provider: incisional hernia  CC: incisional hernia  HPI: this is a very pleasant 78 year old gentleman referred by Dr. Alinda Money for evaluation of a supraumbilical incisional hernia.  Patient underwent robotic facetectomy 5 years ago, but no other abdominal surgeries.  Has noticed a small bulge at the region of his supraumbilical port site associated with some discomfort.  No episodes of incarceration although he did go to the emergency room a short while ago (this was a weak force facility and therefore I do not have the CT patella to review but he was diagnosed with an incisional hernia and discharged.  He is interested in having this repaired.  He does have a history of coronary artery disease with stents in place, is on a daily baby aspirin but reports no other major health problems.  His cardiologist is Dr. Acie Fredrickson.   Allergies  Allergen Reactions  . Codeine Itching  . Percocet [Oxycodone-Acetaminophen] Itching    Past Medical History:  Diagnosis Date  . Allergy   . Anemia   . Anxiety   . Arthritis   . CAD (coronary artery disease) 2002   stent placement  . Cataract    removed left eye   . Depression   . Environmental allergies   . Esophageal reflux    infrequent - no meds  . History of skin cancer   . Hyperlipidemia   . Hypertension   . Macular degeneration   . 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 Prostate cancer ([place]) 06/17/13   gleason 3+4=7, vol 57.52 cc  . Prostate cancer ([place])   . Sciatica   . Sleep apnea    no longer needs c- pap per sleep study per pt    Past Surgical History:  Procedure Laterality Date  . ANGIOPLASTY  2002   stent placement  . [place], 2002, 2005   lumbar fusion with hardware  . COLONOSCOPY    . [place]   left  . LYMPHADENECTOMY Bilateral 02/05/2014   Procedure: LYMPHADENECTOMY;  Surgeon: Raynelle Bring, MD;  Location: [facility];  Service: Urology;  Laterality: Bilateral;  . POLYPECTOMY    . PROSTATE BIOPSY  06/17/13   gleason 7  . ROBOT ASSISTED LAPAROSCOPIC RADICAL PROSTATECTOMY N/A 02/05/2014   Procedure: ROBOTIC ASSISTED LAPAROSCOPIC RADICAL PROSTATECTOMY LEVEL 2;  Surgeon: Raynelle Bring, MD;  Location[facility];  Service: Urology;  Laterality: N/A;    Family History  Problem Relation Age of Onset  . COPD Mother   . Diabetes Mother   . Hypertension Mother   . Heart disease Mother   . Breast cancer Mother   . Thyroid disease Mother   . Lung cancer Father   . CAD Brother 1       early  . Lung cancer Brother   . Hypertension Brother   . Hypertension Sister   . Diabetes Brother   . Heart attack Brother 43  . Colon cancer Neg Hx   . Stomach cancer Neg Hx   . Colon polyps Neg Hx   . Esophageal cancer Neg Hx   . Rectal cancer Neg Hx     Social History   Socioeconomic History  . Marital status: Married    Spouse name: Not on file  . Number of children: 2  . Years of education: Not on file  . Highest education  level: Not on file  Occupational History  . Not on file  Social Needs  . Financial resource strain: Not on file  . Food insecurity    Worry: Not on file    Inability: Not on file  . Transportation needs    Medical: Not on file    Non-medical: Not on file  Tobacco Use  . Smoking status: Former Smoker    Packs/day: 0.50    Years: 20.00    Pack years: 10.00    Types: Cigarettes    Quit date: 05/21/1978    Years since quitting: 40.8  . Smokeless tobacco: Never Used  Substance and Sexual Activity  . Alcohol use: Yes    Alcohol/week: 1.0 standard drinks    Types: 1 Glasses of wine per week    Comment: occasional  . Drug use: No  . Sexual activity: Not on file  Lifestyle  . Physical activity    Days per week: Not on file    Minutes per session: Not on file  . Stress: Not on file  Relationships  . Social Herbalist on phone: Not on file     Gets together: Not on file    Attends religious service: Not on file    Active member of club or organization: Not on file    Attends meetings of clubs or organizations: Not on file    Relationship status: Not on file  Other Topics Concern  . Not on file  Social History Narrative  . Not on file    Current Outpatient Medications on File Prior to Visit  Medication Sig Dispense Refill  . aspirin EC 81 MG tablet Take 81 mg by mouth every morning.    Marland Kitchen atorvastatin (LIPITOR) 80 MG tablet Take 40 mg by mouth at bedtime. 1/2 TAB DAILY    . Calcium Carbonate-Vitamin D (CALCIUM 600+D PO) Take 1 capsule by mouth daily.    . Cyanocobalamin (VITAMIN B 12 PO) Take by mouth daily.    Marland Kitchen docusate sodium (COLACE) 100 MG capsule Take 100 mg by mouth once as needed for mild constipation.     . ferrous sulfate 325 (65 FE) MG tablet Take 325 mg by mouth daily.    Marland Kitchen gabapentin (NEURONTIN) 300 MG capsule Take 300 mg by mouth daily.    . Homeopathic Products (ALLERGY MEDICINE PO) Take by mouth daily. 2-4 daily as needed for allergies    . hydrochlorothiazide (HYDRODIURIL) 25 MG tablet Take 25 mg by mouth every morning.    Marland Kitchen HYDROcodone-acetaminophen (NORCO/VICODIN) 5-325 MG per tablet Take 1-2 tablets by mouth every 6 (six) hours as needed for moderate pain. 30 tablet 0  . Iodoquinol-HC (HYDROCORTISONE-IODOQUINOL) 1-1 % CREA Apply 1 application topically daily as needed (rash).    . Multiple Vitamins-Minerals (PRESERVISION AREDS 2 PO) Take 2 capsules by mouth daily.    . nitroGLYCERIN (NITROSTAT) 0.4 MG SL tablet Place 0.4 mg under the tongue every 5 (five) minutes as needed for chest pain.    . Omega-3 Fatty Acids (FISH OIL) 1000 MG CAPS Take 1 capsule by mouth daily.    Marland Kitchen omeprazole (PRILOSEC) 20 MG capsule Take 20 mg by mouth daily.    . polycarbophil (FIBERCON) 625 MG tablet Take 1,250 mg by mouth 2 (two) times daily.     . potassium chloride SA (K-DUR,KLOR-CON) 20 MEQ tablet Take 40 mEq by mouth every  morning.    . Tetrahydrozoline HCl (VISINE OP) Place 1 drop into both eyes  once as needed (dry eyes.).    Marland Kitchen valsartan (DIOVAN) 160 MG tablet Take 160 mg by mouth every morning.     Current Facility-Administered Medications on File Prior to Visit  Medication Dose Route Frequency Provider Last Rate Last Dose  . 0.9 %  sodium chloride infusion  500 mL Intravenous Continuous Danis, Estill Cotta III, MD        Review of Systems: a complete, 10pt review of systems was completed with pertinent positives and negatives as documented in the HPI  Physical Exam: Vitals Weight: 216.2 lb Height: 69in Body Surface Area: 2.14 m Body Mass Index: 31.93 kg/m  Temp.: 98.13F  Pulse: 80 (Regular)  BP: 130/75 (Sitting, Left Arm, Standard)  Gen: alert and well appearing Eye: extraocular motion intact, no scleral icterus Chest: unlabored respirations, symmetrical air entry, clear bilaterally CV: regular rate and rhythm, no pedal edema Abdomen: soft, nontender, nondistended. Subcentimeter umbilical hernia as well as a linear fascial defect within the vertical superior umbilical scar, which is within a approximately 5 cm diastases recti. MSK: strength symmetrical throughout, no deformity Neuro: grossly intact, normal gait Psych: normal mood and affect, appropriate insight Skin: warm and dry, no rash or lesion on limited exam   CBC Latest Ref Rng & Units 02/06/2014 02/06/2014 02/05/2014  WBC 4.0 - 10.5 K/uL - - -  Hemoglobin 13.0 - 17.0 g/dL 10.5(L) 10.1(L) 12.3(L)  Hematocrit 39.0 - 52.0 % 30.7(L) 28.9(L) 36.0(L)  Platelets 150 - 400 K/uL - - -    CMP Latest Ref Rng & Units 05/06/2018 01/30/2014 10/22/2013  Glucose 65 - 99 mg/dL 105(H) 92 -  BUN 8 - 27 mg/dL 16 21 -  Creatinine 0.76 - 1.27 mg/dL 0.96 1.07 1.20  Sodium 134 - 144 mmol/L 140 138 -  Potassium 3.5 - 5.2 mmol/L 3.8 4.6 -  Chloride 96 - 106 mmol/L 102 100 -  CO2 20 - 29 mmol/L 22 28 -  Calcium 8.6 - 10.2 mg/dL 9.0 9.3 -  Total  Protein 6.0 - 8.5 g/dL 6.1 - -  Total Bilirubin 0.0 - 1.2 mg/dL 0.8 - -  Alkaline Phos 39 - 117 IU/L 98 - -  AST 0 - 40 IU/L 19 - -  ALT 0 - 44 IU/L 31 - -    Lab Results  Component Value Date   INR 1.19 08/14/2012   INR 1.11 08/13/2012   INR 1.1 10/08/2006    Imaging: No results found.   A/P: INCISIONAL HERNIA (K43.2) Story: Small, minimally symptomatic. We discussed options for repair and I would recommend a laparoscopic approach. Discussed the technique of the surgery and risks of bleeding, infection, bleeding, scarring, injury to intra-abdominal structures, and hernia recurrence. Discussed the necessity of general anesthesia and associated cardiovascular, pulmonary and thromboembolic risks. Questions were welcomed and answered. He wishes to proceed with surgery. We will schedule pending cardiac clearance.  Patient Active Problem List   Diagnosis Date Noted  . Prostate cancer ([place]) 02/05/2014  . Malignant neoplasm of prostate ([place]) 11/26/2013  . Hypokalemia 08/14/2012  . Chest pain 08/13/2012  . CAD (coronary artery disease) 08/13/2012  . Hypertension 08/13/2012  . Dyslipidemia 08/13/2012  . GERD (gastroesophageal reflux disease) 08/13/2012  . Depression 08/13/2012  . OSA (obstructive sleep apnea) 08/13/2012       Romana Juniper, [facility]  See AMION to contact appropriate on-call provider

## 2019-03-13 ENCOUNTER — Telehealth: Payer: Self-pay | Admitting: *Deleted

## 2019-03-13 NOTE — Telephone Encounter (Signed)
Dr. Acie Fredrickson  Would it be acceptable to hold this patients ASA therapy for 5-7 days prior to incisional hernia repair?   You last saw him 04/2018 at which time he was doing well from a cardiac perspective. He had no anginal symptoms. He has a remote hx of CAD with PCI to LAD in 2002, HTN, HLD and gastritis.   Please send you recommendations to the pre-op pool.   Thank you  Sharee Pimple

## 2019-03-13 NOTE — Telephone Encounter (Signed)
   Primary Cardiologist: [name], MD  Chart reviewed as part of pre-operative protocol coverage. Given past medical history and time since last visit, based on ACC/AHA guidelines, LEMONTE KULT would be at acceptable risk for the planned procedure without further cardiovascular testing.  Per Dr. Acie Fredrickson, it will be acceptable to hold ASA therapy 5-7 days prior to hernia repair. He was last seen by Dr. Acie Fredrickson in follow up 04/2018 at which time he was doing well from a cardiac standpoint. He had no anginal symptoms and was increasing his physical activity back to his baseline without issues.   I will route this recommendation to the requesting party via Epic fax function and remove from pre-op pool.  Please call with questions.  Kathyrn Drown, NP 03/13/2019, 1:36 PM

## 2019-03-13 NOTE — Telephone Encounter (Signed)
   [facility] Pre-operative Risk Assessment    Request for surgical clearance:  1. What type of surgery is being performed? INCISIONAL HERNIA RESPAIR   2. When is this surgery scheduled? TBD   3. What type of clearance is required (medical clearance vs. Pharmacy clearance to hold med vs. Both)? MEDICAL  4. Are there any medications that need to be held prior to surgery and how long? ASA    5. Practice name and name of physician performing surgery? [facility]; DR. Vikki Ports CONNOR  6. What is your office phone number [phone]    7.   What is your office fax number [phone]  8.   Anesthesia type (None, local, MAC, general) ? GENERAL   Mark [name] 03/13/2019, 12:12 PM  _________________________________________________________________   (provider comments below)

## 2019-03-13 NOTE — Telephone Encounter (Signed)
Pt may hold his ASA for 5-7 days prior to hernia repiar.

## 2019-03-21 ENCOUNTER — Encounter: Payer: Self-pay | Admitting: Cardiovascular Disease

## 2019-03-21 ENCOUNTER — Ambulatory Visit: Payer: Medicare Other | Admitting: Cardiovascular Disease

## 2019-03-21 ENCOUNTER — Encounter

## 2019-03-21 ENCOUNTER — Other Ambulatory Visit: Payer: Self-pay

## 2019-03-21 VITALS — BP 114/70 | HR 62 | Ht 69.0 in | Wt 217.0 lb

## 2019-03-21 DIAGNOSIS — E782 Mixed hyperlipidemia: Secondary | ICD-10-CM | POA: Diagnosis not present

## 2019-03-21 DIAGNOSIS — I2510 Atherosclerotic heart disease of native coronary artery without angina pectoris: Secondary | ICD-10-CM

## 2019-03-21 DIAGNOSIS — E785 Hyperlipidemia, unspecified: Secondary | ICD-10-CM

## 2019-03-21 NOTE — Progress Notes (Signed)
Veverly Fells Date of Birth  21-Jan-1941       [id number] [place]    [place], [place]  [address]         Problem List: 1. CAD - PCI to LAD - 2002 [facility])  2. Hypertension 3. Hyperlipidemia 4.  Gastritis - mild GI bleeding     Marden Noble was recently admitted to the hospital with some CP.  Stress myoview was negative.   He is back doing his normal activities.   He swims regularly ( 3 times a week)    Dec. 10, 2014:  He has had some back pain  / sciatica.  No angina.   Able to do all of his normal activities without problems.   He is retired from [employer].    Oct. 13, 2015:  Skye is doing well.  No CP,  On ASA alone  He was diagnosed with prostate cancer in March 2015.     He is off Testosterone supplement.   Now needs surgery.   He has elected to go with the [facility] robotic surgery.    He is splitting his metoprolol in half and taking twice a day.   He swims on a regular basis-perhaps 3 times a week. He swims for about 30-45 minutes and probably as much as one mild each day that he swims. He has no trouble climbing  1-2 flights of stairs. He is somewhat limited by his left ankle injury.  Had a stress Myoview study last year which showed:  IMPRESSION:  1. No scintigraphic evidence of prior infarction or  pharmacologically induced ischemia.  2. Normal wall motion. Ejection fraction - 71%.  Nov. 7, 2016:  Marden Noble is doing great. Swims 3 times a week .  No CP or dyspnea  Had robotic prostate surgery last year.  Is doing well with that .  Nov. 17, 2017:  Marden Noble is seen today for follow up of his CAD, HR is slow today   Tries to avoid salt Has some occasional episodes of orthostasis   Does not check his BP at home .   April 25, 2017: Doing well from a cardiac standpoint.   Has had some gastritis that seems to be related to his naproxyn .  No further GI bleeding . No CP or dyspnea Not getting regular exercise - needs  to get back to his swimming .  Has had some fatigue.  Maybe related to some anemia   Jan. 27, 2019:  Marden Noble is seen today for follow-up of his coronary artery disease. No CP , no dyspnea.   Exercises some,   Has slacked off his swimming now .   Has chronic ankle issues.   March 21, 2019:  Marden Noble is seen back today for follow-up of his coronary artery disease, hypertension, hyperlipidemia.  No cp or dyspnea.   Not much exercise.  Used to swim .  Has a hernia that will need to be repaired.   Will give clearence He is at low risk for his hernia repair.   He may hold his ASA for 5-7 days prior to surgery  Walks his dog regularly  Had labs at [place] from the [place] reveals a triglyceride level of 185.  The LDL is 36.  The HDL is 32.Marland Kitchen  Hemoglobin A1c is 5.5.     Current Outpatient Medications on File Prior to Visit  Medication Sig Dispense Refill  . aspirin EC 81 MG tablet Take 81 mg by mouth every morning.    Marland Kitchen atorvastatin (LIPITOR) 80 MG tablet Take 40 mg by mouth at bedtime. 1/2 TAB DAILY    . Calcium Carbonate-Vitamin D (CALCIUM 600+D PO) Take 1 capsule by mouth daily.    . Cyanocobalamin (VITAMIN B 12 PO) Take by mouth daily.    Marland Kitchen docusate sodium (COLACE) 100 MG capsule Take 100 mg by mouth once as needed for mild constipation.     . ferrous sulfate 325 (65 FE) MG tablet Take 325 mg by mouth daily.    Marland Kitchen gabapentin (NEURONTIN) 300 MG capsule Take 300 mg by mouth daily.    . Homeopathic Products (ALLERGY MEDICINE PO) Take by mouth daily. 2-4 daily as needed for allergies    . hydrochlorothiazide (HYDRODIURIL) 25 MG tablet Take 25 mg by mouth every morning.    Marland Kitchen HYDROcodone-acetaminophen (NORCO/VICODIN) 5-325 MG per tablet Take 1-2 tablets by mouth every 6 (six) hours as needed for moderate pain. 30 tablet 0  . Iodoquinol-HC (HYDROCORTISONE-IODOQUINOL) 1-1 % CREA Apply 1 application topically daily as needed (rash).    . Multiple Vitamins-Minerals (PRESERVISION AREDS 2 PO)  Take 2 capsules by mouth daily.    . nitroGLYCERIN (NITROSTAT) 0.4 MG SL tablet Place 0.4 mg under the tongue every 5 (five) minutes as needed for chest pain.    . Omega-3 Fatty Acids (FISH OIL) 1000 MG CAPS Take 1 capsule by mouth daily.    Marland Kitchen omeprazole (PRILOSEC) 20 MG capsule Take 20 mg by mouth daily.    . polycarbophil (FIBERCON) 625 MG tablet Take 1,250 mg by mouth 2 (two) times daily.     . potassium chloride SA (K-DUR,KLOR-CON) 20 MEQ tablet Take 40 mEq by mouth every morning.    . Tetrahydrozoline HCl (VISINE OP) Place 1 drop into both eyes once as needed (dry eyes.).    Marland Kitchen valsartan (DIOVAN) 160 MG tablet Take 160 mg by mouth every morning.     Current Facility-Administered Medications on File Prior to Visit  Medication Dose Route Frequency Provider Last Rate Last Admin  . 0.9 %  sodium chloride infusion  500 mL Intravenous Continuous Nelida Meuse III, MD      also taking Metoprolol 12.5 BID   Allergies  Allergen Reactions  . Codeine Itching  . Percocet [Oxycodone-Acetaminophen] Itching    Past Medical History:  Diagnosis Date  . Allergy   . Anemia   . Anxiety   . Arthritis   . CAD (coronary artery disease) 2002   stent placement  . Cataract    removed left eye   . Depression   . Environmental allergies   . Esophageal reflux    infrequent - no meds  . History of skin cancer   . Hyperlipidemia   . Hypertension   . Macular degeneration   . 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 Prostate cancer ([place]) 06/17/13   gleason 3+4=7, vol 57.52 cc  . Prostate cancer ([place])   . Sciatica   . Sleep apnea    no longer needs c- pap per sleep study per pt    Past Surgical History:  Procedure Laterality Date  . ANGIOPLASTY  2002   stent placement  . [place], 2002, 2005   lumbar fusion with hardware  . COLONOSCOPY    .  [place]   left  . LYMPHADENECTOMY  Bilateral 02/05/2014   Procedure: LYMPHADENECTOMY;  Surgeon: Raynelle Bring, MD;  Location: [facility];  Service: Urology;  Laterality: Bilateral;  . POLYPECTOMY    . PROSTATE BIOPSY  06/17/13   gleason 7  . ROBOT ASSISTED LAPAROSCOPIC RADICAL PROSTATECTOMY N/A 02/05/2014   Procedure: ROBOTIC ASSISTED LAPAROSCOPIC RADICAL PROSTATECTOMY LEVEL 2;  Surgeon: Raynelle Bring, MD;  Location: [facility];  Service: Urology;  Laterality: N/A;    Social History   Tobacco Use  Smoking Status Former Smoker  . Packs/day: 0.50  . Years: 20.00  . Pack years: 10.00  . Types: Cigarettes  . Quit date: 05/21/1978  . Years since quitting: 40.8  Smokeless Tobacco Never Used    Social History   Substance and Sexual Activity  Alcohol Use Yes  . Alcohol/week: 1.0 standard drinks  . Types: 1 Glasses of wine per week   Comment: occasional    Family History  Problem Relation Age of Onset  . COPD Mother   . Diabetes Mother   . Hypertension Mother   . Heart disease Mother   . Breast cancer Mother   . Thyroid disease Mother   . Lung cancer Father   . CAD Brother 88       early  . Lung cancer Brother   . Hypertension Brother   . Hypertension Sister   . Diabetes Brother   . Heart attack Brother 32  . Colon cancer Neg Hx   . Stomach cancer Neg Hx   . Colon polyps Neg Hx   . Esophageal cancer Neg Hx   . Rectal cancer Neg Hx     Reviw of Systems:  Reviewed in the HPI.  All other systems are negative.  Physical Exam: There were no vitals taken for this visit.  GEN:  Well nourished, well developed in no acute distress HEENT: Normal NECK: No JVD; No carotid bruits LYMPHATICS: No lymphadenopathy CARDIAC: RRR , no murmurs, rubs, gallops RESPIRATORY:  Clear to auscultation without rales, wheezing or rhonchi  ABDOMEN: Soft, non-tender, non-distended MUSCULOSKELETAL:  No edema; No deformity  SKIN: Warm and dry NEUROLOGIC:  Alert and oriented x 3   ECG:  NSR at 62.   Old INf. Mi .  No changes from  previous   Assessment / Plan:   1. CAD - PCI to LAD - 2002 [facility])  -  He is doing very well from a cardiac standpoint.  Is not had any episodes of chest pain or shortness of breath. He needs to have hernia repair.  It would be okay for him to hold his aspirin for 5 to 7 days prior to hernia repair.  He is at low risk for his upcoming procedure.  2. Hypertension -   blood pressure is well controlled.  3. Hyperlipidemia -   his lipids from the [facility] look good.  Triglyceride levels are mildly elevated.   We will have him return to see an APP in 1 year.   [name], MD  03/21/2019 9:35 AM    [address],  [place], [place]  [address] Pager [phone] Phone: [phone]; Fax: [phone]    [phone]   Fax [phone]

## 2019-03-21 NOTE — Patient Instructions (Addendum)
Medication Instructions:  Your physician recommends that you continue on your current medications as directed. Please refer to the Current Medication list given to you today.  Labwork: None ordered.  Testing/Procedures: None ordered.  Follow-Up: Your physician recommends that you schedule a follow-up appointment in:   14 months with Robbie Lis, PA, Pecolia Ades, NP, or Richardson Dopp, [place]  Any Other Special Instructions Will Be Listed Below (If Applicable).     If you need a refill on your cardiac medications before your next appointment, please call your pharmacy.

## 2019-03-21 NOTE — Telephone Encounter (Signed)
   Primary Cardiologist: [name], MD  Chart reviewed as part of pre-operative protocol coverage. Given past medical history and time since last visit, based on ACC/AHA guidelines, Mark [name] would be at acceptable risk for the planned procedure without further cardiovascular testing.   He was seen by his primary cardiologist Dr. Acie Fredrickson 03/21/2019. Mr. Wyndham is post PCI in 2002 and his CAD is stable. He is deemed low risk for his upcoming hernia repair and may hold his aspirin for 5 to 7 days prior to hernia repair per Dr. Acie Fredrickson.   His clearance was previously routed to the requesting party on 03/13/2019. I will route this update the requesting party as well.   I will route this recommendation to the requesting party via Epic fax function and remove from pre-op pool.  Please call with questions.  Loel Dubonnet, NP 03/21/2019, 10:10 AM

## 2019-03-21 NOTE — Telephone Encounter (Signed)
Mark [name] was seen in the office today for his regular visit.  He needs to have hernia repair next month.  He has a history of coronary artery disease and is status post PCI in 2002.  He has been very stable.  He is at low risk for his upcoming hernia repair.  He may hold his aspirin for 5 to 7 days prior to surgery.    [name], MD  03/21/2019 10:00 AM    [address],  [place], [place]  [address] Phone: [phone]; Fax: [phone]

## 2019-04-23 NOTE — Patient Instructions (Signed)
DUE TO COVID-19 ONLY ONE VISITOR IS ALLOWED TO COME WITH YOU AND STAY IN THE WAITING ROOM ONLY DURING PRE OP AND PROCEDURE DAY OF SURGERY. THE 1 VISITOR MAY VISIT WITH YOU AFTER SURGERY IN YOUR PRIVATE ROOM DURING VISITING HOURS ONLY!  YOU NEED TO HAVE A COVID 19 TEST ON: 04/24/2019 @   1:00 PM, THIS TEST MUST BE DONE BEFORE SURGERY, COME  [place], [place] , [id number].  ([place]) ONCE YOUR COVID TEST IS COMPLETED, PLEASE BEGIN THE QUARANTINE INSTRUCTIONS AS OUTLINED IN YOUR HANDOUT.                Mark [name]       Your procedure is scheduled on: 04/28/2019    Report to [facility] Main  Entrance     Report to short stay at: 5:30 AM     Call this number if you have problems the morning of surgery [phone]     Remember: Do not eat food or drink liquids :After Midnight.    BRUSH YOUR TEETH MORNING OF SURGERY AND RINSE YOUR MOUTH OUT, NO CHEWING GUM CANDY OR MINTS.     Take these medicines the morning of surgery with A SIP OF WATER: gabapentin,tylenol as needed.Use your eye drops.                                 You may not have any metal on your body including hair pins and              piercings  Do not wear jewelry, lotions, powders or perfumes, deodorant             Men may shave face and neck.     Do not bring valuables to the hospital. [place].  Contacts, dentures or bridgework may not be worn into surgery.  Leave suitcase in the car. After surgery it may be brought to your room.     Patients discharged the day of surgery will not be allowed to drive home. IF YOU ARE HAVING SURGERY AND GOING HOME THE SAME DAY, YOU MUST HAVE AN ADULT TO DRIVE YOU HOME AND  BE WITH YOU FOR 24 HOURS. YOU MAY GO HOME BY TAXI OR UBER OR ORTHERWISE, BUT AN ADULT MUST ACCOMPANY YOU HOME AND STAY WITH YOU FOR 24 HOURS.  Name and phone number of your driver:  Special Instructions: N/A    Please read over the following fact sheets you were given: _____________________________________________________________________             [facility] - Preparing for Surgery Before surgery, you can play an important role.  Because skin is not sterile, your skin needs to be as free of germs as possible.  You can reduce the number of germs on your skin by washing with CHG (chlorahexidine gluconate) soap before surgery.  CHG is an antiseptic cleaner which kills germs and bonds with the skin to continue killing germs even after washing. Please DO NOT use if you have an allergy to CHG or antibacterial soaps.  If your skin becomes reddened/irritated stop using the CHG and inform your nurse when you arrive at Short Stay. Do not shave (including legs and underarms) for at least 48 hours prior to the first CHG shower.  You may  shave your face/neck. Please follow these instructions carefully:  1.  Shower with CHG Soap the night before surgery and the  morning of Surgery.  2.  If you choose to wash your hair, wash your hair first as usual with your  normal  shampoo.  3.  After you shampoo, rinse your hair and body thoroughly to remove the  shampoo.                           4.  Use CHG as you would any other liquid soap.  You can apply chg directly  to the skin and wash                       Gently with a scrungie or clean washcloth.  5.  Apply the CHG Soap to your body ONLY FROM THE NECK DOWN.   Do not use on face/ open                           Wound or open sores. Avoid contact with eyes, ears mouth and genitals (private parts).                       Wash face,  Genitals (private parts) with your normal soap.             6.  Wash thoroughly, paying special attention to the area where your surgery  will be performed.  7.  Thoroughly rinse your body with warm water from the neck down.  8.  DO NOT shower/wash with your normal soap after using and rinsing off  the CHG Soap.                9.  Pat  yourself dry with a clean towel.            10.  Wear clean pajamas.            11.  Place clean sheets on your bed the night of your first shower and do not  sleep with pets. Day of Surgery : Do not apply any lotions/deodorants the morning of surgery.  Please wear clean clothes to the hospital/surgery center.  FAILURE TO FOLLOW THESE INSTRUCTIONS MAY RESULT IN THE CANCELLATION OF YOUR SURGERY PATIENT SIGNATURE_________________________________  NURSE SIGNATURE__________________________________  ________________________________________________________________________

## 2019-04-24 ENCOUNTER — Other Ambulatory Visit (HOSPITAL_COMMUNITY)
Admission: RE | Admit: 2019-04-24 | Discharge: 2019-04-24 | Disposition: A | Discharge status: Home / Self Care | Payer: No Typology Code available for payment source | Source: Ambulatory Visit | Attending: Surgery | Admitting: Surgery

## 2019-04-24 ENCOUNTER — Encounter (HOSPITAL_COMMUNITY): Payer: Self-pay

## 2019-04-24 ENCOUNTER — Encounter (HOSPITAL_COMMUNITY)
Admission: RE | Admit: 2019-04-24 | Discharge: 2019-04-24 | Disposition: A | Discharge status: Home / Self Care | Payer: No Typology Code available for payment source | Source: Ambulatory Visit | Attending: Surgery | Admitting: Surgery

## 2019-04-24 ENCOUNTER — Other Ambulatory Visit: Payer: Self-pay

## 2019-04-24 DIAGNOSIS — K432 Incisional hernia without obstruction or gangrene: Secondary | ICD-10-CM | POA: Diagnosis not present

## 2019-04-24 DIAGNOSIS — Z20822 Contact with and (suspected) exposure to covid-19: Secondary | ICD-10-CM | POA: Diagnosis not present

## 2019-04-24 DIAGNOSIS — Z01812 Encounter for preprocedural laboratory examination: Secondary | ICD-10-CM | POA: Insufficient documentation

## 2019-04-24 HISTORY — DX: Gastro-esophageal reflux disease without esophagitis: K21.9

## 2019-04-24 LAB — BASIC METABOLIC PANEL
Anion gap: 8 (ref 5–15)
BUN: 18 mg/dL (ref 8–23)
CO2: 28 mmol/L (ref 22–32)
Calcium: 8.9 mg/dL (ref 8.9–10.3)
Chloride: 103 mmol/L (ref 98–111)
Creatinine, Ser: 1.05 mg/dL (ref 0.61–1.24)
GFR calc Af Amer: >60 mL/min (ref >60)
GFR calc non Af Amer: >60 mL/min (ref >60)
Glucose, Bld: 141 mg/dL — ABNORMAL HIGH (ref 70–99)
Potassium: 4.8 mmol/L (ref 3.5–5.1)
Sodium: 139 mmol/L (ref 135–145)

## 2019-04-24 LAB — CBC WITH DIFFERENTIAL/PLATELET
Abs Immature Granulocytes: 0.03 10*3/uL (ref 0.00–0.07)
Basophils Absolute: 0.1 10*3/uL (ref 0.0–0.1)
Basophils Relative: 1 %
Eosinophils Absolute: 0.2 10*3/uL (ref 0.0–0.5)
Eosinophils Relative: 4 %
HCT: 47.8 % (ref 39.0–52.0)
Hemoglobin: 15.9 g/dL (ref 13.0–17.0)
Immature Granulocytes: 1 %
Lymphocytes Relative: 20 %
Lymphs Abs: 1.2 10*3/uL (ref 0.7–4.0)
MCH: 30.6 pg (ref 26.0–34.0)
MCHC: 33.3 g/dL (ref 30.0–36.0)
MCV: 92.1 fL (ref 80.0–100.0)
Monocytes Absolute: 0.5 10*3/uL (ref 0.1–1.0)
Monocytes Relative: 8 %
Neutro Abs: 3.8 10*3/uL (ref 1.7–7.7)
Neutrophils Relative %: 66 %
Platelets: 221 10*3/uL (ref 150–400)
RBC: 5.19 MIL/uL (ref 4.22–5.81)
RDW: 12.4 % (ref 11.5–15.5)
WBC: 5.7 10*3/uL (ref 4.0–10.5)
nRBC: 0 % (ref 0.0–0.2)

## 2019-04-24 NOTE — Progress Notes (Signed)
PCP - Shirline Frees. LOV: 09/2018 Cardiologist - Grayland Jack.: clearance. LOV: 03/21/19 EPIC  Chest x-ray -  EKG - 03/21/19. EPIC Stress Test -  ECHO -  Cardiac Cath -   Sleep Study -  CPAP -   Fasting Blood Sugar -  Checks Blood Sugar _____ times a day  Blood Thinner Instructions: Aspirin Instructions: Last Dose:  Anesthesia review:   Patient denies shortness of breath, fever, cough and chest pain at PAT appointment   Patient verbalized understanding of instructions that were given to them at the PAT appointment. Patient was also instructed that they will need to review over the PAT instructions again at home before surgery.

## 2019-04-24 NOTE — Progress Notes (Signed)
Anesthesia Chart Review   Case: [id number] Date/Time: 04/28/19 0715   Procedure: LAPAROSCOPIC INCISIONAL HERNIA with mesh (N/A )   Anesthesia type: General   Pre-op diagnosis: INCISIONAL HERNIA   Location: [facility] ROOM 02 / [facility]   Surgeons: Clovis Riley, MD       DISCUSSION:79 y.o. former smoker (10 pack years, quit 05/21/78) with h.o HTN, sleep apnea, HLD, GERD, CAD (+stent 2002), prostate cancer, incisional hernia scheduled for above procedure 04/28/19 with Dr. Romana Juniper.    Pt cleared by cardiologist Dr. [name].  Per OV note, "He is doing very well from a cardiac standpoint.  Is not had any episodes of chest pain or shortness of breath. He needs to have hernia repair.  It would be okay for him to hold his aspirin for 5 to 7 days prior to hernia repair.  He is at low risk for his upcoming procedure"  Anticipate pt can proceed with planned procedure barring acute status change.    VS: There were no vitals taken for this visit.  PROVIDERS: Shirline Frees, MD is PCP   [name], MD is Cardiologist  LABS: Labs reviewed: Acceptable for surgery. 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 rt lower leg / foot also has foot drop left foot wears brace   Prostate cancer ([place]) 06/17/13   gleason 3+4=7, vol 57.52 cc   Prostate cancer ([place])    Sciatica    Sleep apnea    no longer needs c- pap per sleep study per pt    Past Surgical  History:  Procedure Laterality Date   ANGIOPLASTY  2002   stent placement   [place], 2002, 2005   lumbar fusion with hardware   COLONOSCOPY     [place]   left   LYMPHADENECTOMY Bilateral 02/05/2014   Procedure: LYMPHADENECTOMY;  Surgeon: Raynelle Bring, MD;  Location: [facility];  Service: Urology;  Laterality: Bilateral;   POLYPECTOMY     PROSTATE BIOPSY  06/17/13   gleason 7   ROBOT ASSISTED LAPAROSCOPIC RADICAL PROSTATECTOMY N/A 02/05/2014   Procedure: ROBOTIC ASSISTED LAPAROSCOPIC RADICAL PROSTATECTOMY LEVEL 2;  Surgeon: Raynelle Bring, MD;  Location[facility];  Service: Urology;  Laterality: N/A;    MEDICATIONS:  0.9 %  sodium chloride infusion    acetaminophen (TYLENOL) 325 MG tablet   aspirin EC 81 MG tablet   atorvastatin (LIPITOR) 80 MG tablet   Calcium Carbonate-Vitamin D (CALCIUM 600+D PO)   Cholecalciferol (VITAMIN D-3) 25 MCG (1000 UT) CAPS   Cyanocobalamin (VITAMIN B 12 PO)   ferrous sulfate 325 (65 FE) MG tablet   gabapentin (NEURONTIN) 300 MG capsule   hydrochlorothiazide (HYDRODIURIL) 25 MG tablet   Multiple Vitamins-Minerals (PRESERVISION AREDS 2 PO)   nitroGLYCERIN (NITROSTAT) 0.4 MG SL tablet  Omega-3 Fatty Acids (FISH OIL) 1000 MG CAPS   Phenylephrine HCl (SINEX ULTRA FINE MIST REGULAR NA)   polycarbophil (FIBERCON) 625 MG tablet   potassium chloride SA (K-DUR,KLOR-CON) 20 MEQ tablet   Tetrahydrozoline HCl (VISINE OP)   valsartan (DIOVAN) 160 MG tablet   HYDROcodone-acetaminophen (NORCO/VICODIN) 5-325 MG per tablet    Konrad Felix, PA-C [facility] Pre-Surgical Testing [phone] 04/24/19  10:48 AM

## 2019-04-25 LAB — NOVEL CORONAVIRUS, NAA (HOSP ORDER, SEND-OUT TO REF LAB; TAT 18-24 HRS): SARS-CoV-2, NAA: NOT DETECTED

## 2019-04-27 ENCOUNTER — Encounter (HOSPITAL_COMMUNITY): Payer: Self-pay | Admitting: Surgery

## 2019-04-27 MED ORDER — BUPIVACAINE LIPOSOME 1.3 % IJ SUSP
20.0000 mL | INTRAMUSCULAR | Status: DC
  Filled 2019-04-27: qty 20

## 2019-04-27 NOTE — Anesthesia Preprocedure Evaluation (Addendum)
Anesthesia Evaluation  Patient identified by MRN, date of birth, ID band Patient awake    Reviewed: Allergy & Precautions, NPO status , Patient's Chart, lab work & pertinent test results  Airway Mallampati: II  TM Distance: >3 FB Neck ROM: Full    Dental no notable dental hx. (+) Caps, Teeth Intact   Pulmonary sleep apnea , former smoker,  No longer on CPAP   Pulmonary exam normal breath sounds clear to auscultation       Cardiovascular hypertension, Pt. on medications + CAD and + Cardiac Stents  Normal cardiovascular exam Rhythm:Regular Rate:Normal  Stent 2002- stable  EKG 03/21/2019 NSR, inferior MI  Cardiology evaluation- low risk for procedure   Neuro/Psych PSYCHIATRIC DISORDERS Anxiety Depression Mononeuritis multiplex- right foot drop and numbness Sciatica   Neuromuscular disease    GI/Hepatic Neg liver ROS, GERD  Medicated and Controlled,  Endo/Other  Hyperlipidemia Obesity  Renal/GU negative Renal ROS   Hx/o prostate Ca S/P Prostatectomy    Musculoskeletal  (+) Arthritis , Osteoarthritis,  Low back pain Incisional hernia   Abdominal (+) + obese,   Peds  Hematology  (+) anemia ,   Anesthesia Other Findings   Reproductive/Obstetrics                            Anesthesia Physical Anesthesia Plan  ASA: III  Anesthesia Plan: General   Post-op Pain Management:    Induction: Intravenous  PONV Risk Score and Plan: 4 or greater and Ondansetron, Dexamethasone and Treatment may vary due to age or medical condition  Airway Management Planned: Oral ETT  Additional Equipment:   Intra-op Plan:   Post-operative Plan: Extubation in OR  Informed Consent: I have reviewed the patients History and Physical, chart, labs and discussed the procedure including the risks, benefits and alternatives for the proposed anesthesia with the patient or authorized representative who has  indicated his/her understanding and acceptance.     Dental advisory given  Plan Discussed with: CRNA and Surgeon  Anesthesia Plan Comments:        Anesthesia Quick Evaluation

## 2019-04-28 ENCOUNTER — Encounter (HOSPITAL_COMMUNITY): Payer: Self-pay | Admitting: Surgery

## 2019-04-28 ENCOUNTER — Encounter (HOSPITAL_COMMUNITY)
Admission: RE | Disposition: A | Discharge status: Home / Self Care | Payer: Self-pay | Source: Home / Self Care | Attending: Surgery

## 2019-04-28 ENCOUNTER — Ambulatory Visit (HOSPITAL_COMMUNITY): Payer: No Typology Code available for payment source | Admitting: Physician Assistant

## 2019-04-28 ENCOUNTER — Ambulatory Visit (HOSPITAL_COMMUNITY)
Admission: RE | Admit: 2019-04-28 | Discharge: 2019-04-28 | Disposition: A | Discharge status: Home / Self Care | Payer: No Typology Code available for payment source | Attending: Surgery | Admitting: Surgery

## 2019-04-28 DIAGNOSIS — Z8249 Family history of ischemic heart disease and other diseases of the circulatory system: Secondary | ICD-10-CM | POA: Diagnosis not present

## 2019-04-28 DIAGNOSIS — F329 Major depressive disorder, single episode, unspecified: Secondary | ICD-10-CM | POA: Diagnosis not present

## 2019-04-28 DIAGNOSIS — Z9079 Acquired absence of other genital organ(s): Secondary | ICD-10-CM | POA: Insufficient documentation

## 2019-04-28 DIAGNOSIS — I10 Essential (primary) hypertension: Secondary | ICD-10-CM | POA: Insufficient documentation

## 2019-04-28 DIAGNOSIS — Z885 Allergy status to narcotic agent status: Secondary | ICD-10-CM | POA: Diagnosis not present

## 2019-04-28 DIAGNOSIS — Z6832 Body mass index (BMI) 32.0-32.9, adult: Secondary | ICD-10-CM | POA: Diagnosis not present

## 2019-04-28 DIAGNOSIS — Z955 Presence of coronary angioplasty implant and graft: Secondary | ICD-10-CM | POA: Diagnosis not present

## 2019-04-28 DIAGNOSIS — I2510 Atherosclerotic heart disease of native coronary artery without angina pectoris: Secondary | ICD-10-CM | POA: Insufficient documentation

## 2019-04-28 DIAGNOSIS — Z87891 Personal history of nicotine dependence: Secondary | ICD-10-CM | POA: Insufficient documentation

## 2019-04-28 DIAGNOSIS — Z7982 Long term (current) use of aspirin: Secondary | ICD-10-CM | POA: Diagnosis not present

## 2019-04-28 DIAGNOSIS — E785 Hyperlipidemia, unspecified: Secondary | ICD-10-CM | POA: Insufficient documentation

## 2019-04-28 DIAGNOSIS — E669 Obesity, unspecified: Secondary | ICD-10-CM | POA: Insufficient documentation

## 2019-04-28 DIAGNOSIS — M1990 Unspecified osteoarthritis, unspecified site: Secondary | ICD-10-CM | POA: Diagnosis not present

## 2019-04-28 DIAGNOSIS — Z8546 Personal history of malignant neoplasm of prostate: Secondary | ICD-10-CM | POA: Insufficient documentation

## 2019-04-28 DIAGNOSIS — K432 Incisional hernia without obstruction or gangrene: Secondary | ICD-10-CM | POA: Insufficient documentation

## 2019-04-28 DIAGNOSIS — K429 Umbilical hernia without obstruction or gangrene: Secondary | ICD-10-CM | POA: Diagnosis not present

## 2019-04-28 DIAGNOSIS — G4733 Obstructive sleep apnea (adult) (pediatric): Secondary | ICD-10-CM | POA: Insufficient documentation

## 2019-04-28 DIAGNOSIS — K219 Gastro-esophageal reflux disease without esophagitis: Secondary | ICD-10-CM | POA: Diagnosis not present

## 2019-04-28 DIAGNOSIS — Z79899 Other long term (current) drug therapy: Secondary | ICD-10-CM | POA: Insufficient documentation

## 2019-04-28 HISTORY — PX: INCISIONAL HERNIA REPAIR: SHX193

## 2019-04-28 SURGERY — REPAIR, HERNIA, INCISIONAL, LAPAROSCOPIC
Anesthesia: General | Site: Abdomen

## 2019-04-28 MED ORDER — PROPOFOL 10 MG/ML IV BOLUS
INTRAVENOUS | Status: AC
  Filled 2019-04-28: qty 20

## 2019-04-28 MED ORDER — ROCURONIUM BROMIDE 10 MG/ML (PF) SYRINGE
PREFILLED_SYRINGE | INTRAVENOUS | Status: DC | PRN
  Administered 2019-04-28: 10 mg via INTRAVENOUS
  Administered 2019-04-28: 60 mg via INTRAVENOUS

## 2019-04-28 MED ORDER — OXYCODONE HCL 5 MG PO TABS: 5.0000 mg | ORAL_TABLET | Freq: Three times a day (TID) | ORAL | 0 refills | Status: AC | PRN

## 2019-04-28 MED ORDER — LACTATED RINGERS IV SOLN: INTRAVENOUS | Status: DC

## 2019-04-28 MED ORDER — LIDOCAINE 2% (20 MG/ML) 5 ML SYRINGE
INTRAMUSCULAR | Status: AC
  Filled 2019-04-28: qty 5

## 2019-04-28 MED ORDER — OXYCODONE HCL 5 MG PO TABS: 5.0000 mg | ORAL_TABLET | ORAL | Status: DC | PRN

## 2019-04-28 MED ORDER — HYDRALAZINE HCL 20 MG/ML IJ SOLN
INTRAMUSCULAR | Status: AC
  Filled 2019-04-28: qty 1

## 2019-04-28 MED ORDER — ROCURONIUM BROMIDE 10 MG/ML (PF) SYRINGE
PREFILLED_SYRINGE | INTRAVENOUS | Status: AC
  Filled 2019-04-28: qty 10

## 2019-04-28 MED ORDER — PROPOFOL 10 MG/ML IV BOLUS
INTRAVENOUS | Status: DC | PRN
  Administered 2019-04-28: 150 mg via INTRAVENOUS

## 2019-04-28 MED ORDER — SODIUM CHLORIDE 0.9% FLUSH: 3.0000 mL | Freq: Two times a day (BID) | INTRAVENOUS | Status: DC

## 2019-04-28 MED ORDER — SODIUM CHLORIDE 0.9 % IV SOLN: 250.0000 mL | INTRAVENOUS | Status: DC | PRN

## 2019-04-28 MED ORDER — ESMOLOL HCL 100 MG/10ML IV SOLN
INTRAVENOUS | Status: AC
  Filled 2019-04-28: qty 10

## 2019-04-28 MED ORDER — SUGAMMADEX SODIUM 500 MG/5ML IV SOLN
INTRAVENOUS | Status: DC | PRN
  Administered 2019-04-28: 275 mg via INTRAVENOUS

## 2019-04-28 MED ORDER — GABAPENTIN 300 MG PO CAPS
300.0000 mg | ORAL_CAPSULE | ORAL | Status: DC
  Filled 2019-04-28: qty 1

## 2019-04-28 MED ORDER — ONDANSETRON HCL 4 MG/2ML IJ SOLN: 4.0000 mg | Freq: Once | INTRAMUSCULAR | Status: DC | PRN

## 2019-04-28 MED ORDER — HYDRALAZINE HCL 20 MG/ML IJ SOLN
INTRAMUSCULAR | Status: DC | PRN
  Administered 2019-04-28 (×2): 5 mg via INTRAVENOUS
  Administered 2019-04-28 (×2): 2.5 mg via INTRAVENOUS

## 2019-04-28 MED ORDER — CEFAZOLIN SODIUM-DEXTROSE 2-4 GM/100ML-% IV SOLN
2.0000 g | INTRAVENOUS | Status: AC
  Administered 2019-04-28: 2 g via INTRAVENOUS
  Filled 2019-04-28: qty 100

## 2019-04-28 MED ORDER — FENTANYL CITRATE (PF) 100 MCG/2ML IJ SOLN
INTRAMUSCULAR | Status: DC | PRN
  Administered 2019-04-28: 75 ug via INTRAVENOUS
  Administered 2019-04-28: 50 ug via INTRAVENOUS
  Administered 2019-04-28: 25 ug via INTRAVENOUS
  Administered 2019-04-28 (×3): 50 ug via INTRAVENOUS

## 2019-04-28 MED ORDER — FENTANYL CITRATE (PF) 100 MCG/2ML IJ SOLN
INTRAMUSCULAR | Status: AC
  Filled 2019-04-28: qty 2

## 2019-04-28 MED ORDER — SODIUM CHLORIDE 0.9% FLUSH: 3.0000 mL | INTRAVENOUS | Status: DC | PRN

## 2019-04-28 MED ORDER — BUPIVACAINE LIPOSOME 1.3 % IJ SUSP
INTRAMUSCULAR | Status: DC | PRN
  Administered 2019-04-28: 20 mL

## 2019-04-28 MED ORDER — LIDOCAINE 2% (20 MG/ML) 5 ML SYRINGE
INTRAMUSCULAR | Status: AC
  Filled 2019-04-28: qty 10

## 2019-04-28 MED ORDER — DEXAMETHASONE SODIUM PHOSPHATE 10 MG/ML IJ SOLN
INTRAMUSCULAR | Status: DC | PRN
  Administered 2019-04-28: 10 mg via INTRAVENOUS

## 2019-04-28 MED ORDER — 0.9 % SODIUM CHLORIDE (POUR BTL) OPTIME
TOPICAL | Status: DC | PRN
  Administered 2019-04-28: 1000 mL

## 2019-04-28 MED ORDER — ONDANSETRON HCL 4 MG/2ML IJ SOLN
INTRAMUSCULAR | Status: DC | PRN
  Administered 2019-04-28: 4 mg via INTRAVENOUS

## 2019-04-28 MED ORDER — BUPIVACAINE HCL (PF) 0.25 % IJ SOLN
INTRAMUSCULAR | Status: DC | PRN
  Administered 2019-04-28: 30 mL

## 2019-04-28 MED ORDER — ACETAMINOPHEN 325 MG PO TABS: 650.0000 mg | ORAL_TABLET | ORAL | Status: DC | PRN

## 2019-04-28 MED ORDER — ESMOLOL HCL 100 MG/10ML IV SOLN
INTRAVENOUS | Status: DC | PRN
  Administered 2019-04-28: 30 mg via INTRAVENOUS

## 2019-04-28 MED ORDER — ACETAMINOPHEN 500 MG PO TABS
1000.0000 mg | ORAL_TABLET | ORAL | Status: DC
  Filled 2019-04-28: qty 2

## 2019-04-28 MED ORDER — LIDOCAINE 2% (20 MG/ML) 5 ML SYRINGE
INTRAMUSCULAR | Status: DC | PRN
  Administered 2019-04-28: 1.5 mg/kg/h via INTRAVENOUS
  Administered 2019-04-28: 80 mg via INTRAVENOUS

## 2019-04-28 MED ORDER — ONDANSETRON HCL 4 MG/2ML IJ SOLN
INTRAMUSCULAR | Status: AC
  Filled 2019-04-28: qty 2

## 2019-04-28 MED ORDER — FENTANYL CITRATE (PF) 100 MCG/2ML IJ SOLN
25.0000 ug | INTRAMUSCULAR | Status: DC | PRN
  Administered 2019-04-28 (×2): 50 ug via INTRAVENOUS

## 2019-04-28 MED ORDER — FENTANYL CITRATE (PF) 100 MCG/2ML IJ SOLN: 25.0000 ug | INTRAMUSCULAR | Status: DC | PRN

## 2019-04-28 MED ORDER — DEXAMETHASONE SODIUM PHOSPHATE 10 MG/ML IJ SOLN
INTRAMUSCULAR | Status: AC
  Filled 2019-04-28: qty 1

## 2019-04-28 MED ORDER — SUGAMMADEX SODIUM 500 MG/5ML IV SOLN
INTRAVENOUS | Status: AC
  Filled 2019-04-28: qty 5

## 2019-04-28 MED ORDER — CHLORHEXIDINE GLUCONATE 4 % EX LIQD: 60.0000 mL | Freq: Once | CUTANEOUS | Status: DC

## 2019-04-28 MED ORDER — DOCUSATE SODIUM 100 MG PO CAPS: 100.0000 mg | ORAL_CAPSULE | Freq: Two times a day (BID) | ORAL | 0 refills | Status: AC

## 2019-04-28 MED ORDER — ACETAMINOPHEN 650 MG RE SUPP: 650.0000 mg | RECTAL | Status: DC | PRN

## 2019-04-28 MED ORDER — SODIUM CHLORIDE (PF) 0.9 % IJ SOLN
INTRAMUSCULAR | Status: AC
  Filled 2019-04-28: qty 10

## 2019-04-28 MED ORDER — BUPIVACAINE HCL (PF) 0.25 % IJ SOLN
INTRAMUSCULAR | Status: AC
  Filled 2019-04-28: qty 30

## 2019-04-28 SURGICAL SUPPLY — 42 items
APL PRP STRL LF DISP 70% ISPRP (MISCELLANEOUS) ×1
APL SKNCLS STERI-STRIP NONHPOA (GAUZE/BANDAGES/DRESSINGS) ×1
BENZOIN TINCTURE PRP APPL 2/3 (GAUZE/BANDAGES/DRESSINGS) ×1 IMPLANT
BINDER ABDOMINAL 12 ML 46-62 (SOFTGOODS) ×1 IMPLANT
BNDG ADH 1X3 SHEER STRL LF (GAUZE/BANDAGES/DRESSINGS) ×1 IMPLANT
BNDG ADH THN 3X1 STRL LF (GAUZE/BANDAGES/DRESSINGS) ×1
CABLE HIGH FREQUENCY MONO STRZ (ELECTRODE) ×2 IMPLANT
CHLORAPREP W/TINT 26 (MISCELLANEOUS) ×2 IMPLANT
COVER SURGICAL LIGHT HANDLE (MISCELLANEOUS) ×2 IMPLANT
COVER WAND RF STERILE (DRAPES) IMPLANT
DEVICE SECURE STRAP 25 ABSORB (INSTRUMENTS) ×2 IMPLANT
ELECT REM PT RETURN 15FT ADLT (MISCELLANEOUS) ×2 IMPLANT
GLOVE BIO SURGEON STRL SZ 6 (GLOVE) ×3 IMPLANT
GLOVE BIOGEL PI IND STRL 6.5 (GLOVE) IMPLANT
GLOVE BIOGEL PI IND STRL 7.0 (GLOVE) IMPLANT
GLOVE BIOGEL PI INDICATOR 6.5 (GLOVE) ×1
GLOVE BIOGEL PI INDICATOR 7.0 (GLOVE) ×1
GLOVE ECLIPSE 6.5 STRL STRAW (GLOVE) ×1 IMPLANT
GLOVE INDICATOR 6.5 STRL GRN (GLOVE) ×2 IMPLANT
GLOVE SURG SS PI 7.0 STRL IVOR (GLOVE) ×1 IMPLANT
GOWN STRL REUS W/TWL LRG LVL3 (GOWN DISPOSABLE) ×3 IMPLANT
GOWN STRL REUS W/TWL XL LVL3 (GOWN DISPOSABLE) ×3 IMPLANT
GRASPER SUT TROCAR 14GX15 (MISCELLANEOUS) ×2 IMPLANT
HEMOSTAT SNOW SURGICEL 2X4 (HEMOSTASIS) ×1 IMPLANT
KIT BASIN OR (CUSTOM PROCEDURE TRAY) ×2 IMPLANT
KIT TURNOVER KIT A (KITS) IMPLANT
MARKER SKIN DUAL TIP RULER LAB (MISCELLANEOUS) ×2 IMPLANT
MESH VENTRALIGHT ST 6X8 (Mesh Specialty) ×2 IMPLANT
MESH VENTRLGHT ELLIPSE 8X6XMFL (Mesh Specialty) IMPLANT
PENCIL SMOKE EVACUATOR (MISCELLANEOUS) IMPLANT
SCISSORS LAP 5X35 DISP (ENDOMECHANICALS) ×2 IMPLANT
SET IRRIG TUBING LAPAROSCOPIC (IRRIGATION / IRRIGATOR) ×1 IMPLANT
SET TUBE SMOKE EVAC HIGH FLOW (TUBING) ×2 IMPLANT
SHEARS HARMONIC ACE PLUS 36CM (ENDOMECHANICALS) IMPLANT
SLEEVE XCEL OPT CAN 5 100 (ENDOMECHANICALS) ×2 IMPLANT
STRIP CLOSURE SKIN 1/2X4 (GAUZE/BANDAGES/DRESSINGS) ×2 IMPLANT
SUT ETHIBOND 0 (SUTURE) ×4 IMPLANT
SUT MNCRL AB 4-0 PS2 18 (SUTURE) ×2 IMPLANT
TOWEL OR 17X26 10 PK STRL BLUE (TOWEL DISPOSABLE) ×2 IMPLANT
TRAY LAPAROSCOPIC (CUSTOM PROCEDURE TRAY) ×2 IMPLANT
TROCAR BLADELESS OPT 5 100 (ENDOMECHANICALS) ×2 IMPLANT
TROCAR XCEL 12X100 BLDLESS (ENDOMECHANICALS) ×2 IMPLANT

## 2019-04-28 NOTE — Op Note (Signed)
Operative Note  Mark [name]  [id number]  [id number]  04/28/2019   Surgeon: Vikki Ports A ConnorMD  Assistant: OR staff  Procedure performed: laparoscopic repair of incisional ventral hernia with mesh underlay  Preop diagnosis: incisional hernia Post-op diagnosis/intraop findings: same, small umbilical hernia  Specimens: no Retained items: no EBL: [id number] Complications: none  Description of procedure: After obtaining informed consent the patient was taken to the operating room and placed supine on operating room table wheregeneral endotracheal anesthesia was initiated, preoperative antibiotics were administered, SCDs applied, and a formal timeout was performed.  The abdomen was prepped and draped in usual sterile fashion.  Peritoneal access was gained using a Visiport in the left upper quadrant and insufflation to 15 mmHg proceeded without incident.  There was no injury from our entry.  On inspection there is a small umbilical hernia and a small supraumbilical fascial defect containing inspissated preperitoneal fat.  A left-sided 12 mm and 5 mm trocar were placed under direct visualization after infiltration with local.  Cautery and blunt dissection were used to dissect the peritoneum and entrapped fat off the anterior abdominal wall.  The falciform ligament was taken down to afford space for the mesh.  Hemostasis was ensured with cautery as well as 1 transfascial 3-0 Vicryl figure-of-eight suture in the left upper quadrant.  A 6 inch x 8 inch ventralight coated mesh was selected, the rough side marked for orientation and stay sutures of 0 Ethibond placed in the 4 cardinal directions.  This was inserted through the 12 mm trocar, unfurled and oriented appropriately.  After infiltration with local, 4 stab incisions were made in the 4 cardinal directions and the transfascial stay sutures were brought through the abdominal wall with a laparoscopic suture passer.  These were tied down securing  the mesh to the abdominal wall.  The secure strap tacker was then used to further secure the mesh circumferentially with an outer crown and an inner crown of tacks to the anterior abdominal wall.  On completion there was no rough mesh exposed and the mesh is flush with the abdominal wall.  The 12 mm trocar site fascia was closed with a 0 Vicryl using laparoscopic suture passer.  The abdomen was once again inspected and hemostasis confirmed.  The remaining trochars were removed.  All skin incisions were closed with subcuticular Monocryl, Steri-Strips and benzoin were applied followed by Band-Aids.  The patient was then awakened, extubated and taken to PACU in stable condition.   All counts were correct at the completion of the case.

## 2019-04-28 NOTE — H&P (Signed)
Surgical H&P  Requesting provider: incisional hernia  CC: incisional hernia  HPI: this is a very pleasant 79 year old gentleman referred by Dr. Alinda Money for evaluation of a supraumbilical incisional hernia. Patient underwent robotic facetectomy 5 years ago, but no other abdominal surgeries. Has noticed a small bulge at the region of his supraumbilical port site associated with some discomfort. No episodes of incarceration although he did go to the emergency room a short while ago (this was a weak force facility and therefore I do not have the CT patella to review but he was diagnosed with an incisional hernia and discharged. He is interested in having this repaired. He does have a history of coronary artery disease with stents in place, is on a daily baby aspirin but reports no other major health problems. His cardiologist is Dr. Acie Fredrickson.      Allergies  Allergen Reactions  . Codeine Itching  . Percocet [Oxycodone-Acetaminophen] Itching       Past Medical History:  Diagnosis Date  . Allergy   . Anemia   . Anxiety   . Arthritis   . CAD (coronary artery disease) 2002   stent placement  . Cataract    removed left eye   . Depression   . Environmental allergies   . Esophageal reflux    infrequent - no meds  . History of skin cancer   . Hyperlipidemia   . Hypertension   . Macular degeneration   . 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 Prostate cancer ([place]) 06/17/13   gleason 3+4=7, vol 57.52 cc  . Prostate cancer ([place])   . Sciatica   . Sleep apnea    no longer needs c- pap per sleep study per pt        Past Surgical History:  Procedure Laterality Date  . ANGIOPLASTY  2002   stent placement  . [place], 2002, 2005   lumbar fusion with hardware  . COLONOSCOPY    . [place]   left  . LYMPHADENECTOMY Bilateral 02/05/2014   Procedure: LYMPHADENECTOMY; Surgeon: Raynelle Bring, MD; Location[facility]; Service: Urology; Laterality: Bilateral;  . POLYPECTOMY    . PROSTATE BIOPSY  06/17/13   gleason 7  . ROBOT ASSISTED LAPAROSCOPIC RADICAL PROSTATECTOMY N/A 02/05/2014   Procedure: ROBOTIC ASSISTED LAPAROSCOPIC RADICAL PROSTATECTOMY LEVEL 2; Surgeon: Raynelle Bring, MD; Location[facility]; Service: Urology; Laterality: N/A;        Family History  Problem Relation Age of Onset  . COPD Mother   . Diabetes Mother   . Hypertension Mother   . Heart disease Mother   . Breast cancer Mother   . Thyroid disease Mother   . Lung cancer Father   . CAD Brother 51   early  . Lung cancer Brother   . Hypertension Brother   . Hypertension Sister   . Diabetes Brother   . Heart attack Brother 58  . Colon cancer Neg Hx   . Stomach cancer Neg Hx   . Colon polyps Neg Hx   . Esophageal cancer Neg Hx   . Rectal cancer Neg Hx    Social History        Socioeconomic History  . Marital status: Married    Spouse name: Not on file  . Number of children: 2  . Years of education: Not on file  . Highest  education level: Not on file  Occupational History  . Not on file  Social Needs  . Financial resource strain: Not on file  . Food insecurity    Worry: Not on file    Inability: Not on file  . Transportation needs    Medical: Not on file    Non-medical: Not on file  Tobacco Use  . Smoking status: Former Smoker    Packs/day: 0.50    Years: 20.00    Pack years: 10.00    Types: Cigarettes    Quit date: 05/21/1978    Years since quitting: 40.8  . Smokeless tobacco: Never Used  Substance and Sexual Activity  . Alcohol use: Yes    Alcohol/week: 1.0 standard drinks    Types: 1 Glasses of wine per week    Comment: occasional  . Drug use: No  . Sexual activity: Not on file  Lifestyle  . Physical activity    Days per week: Not on file    Minutes per session: Not on file  . Stress: Not on file  Relationships  . Social Herbalist on phone: Not on file     Gets together: Not on file    Attends religious service: Not on file    Active member of club or organization: Not on file    Attends meetings of clubs or organizations: Not on file    Relationship status: Not on file  Other Topics Concern  . Not on file  Social History Narrative  . Not on file         Current Outpatient Medications on File Prior to Visit  Medication Sig Dispense Refill  . aspirin EC 81 MG tablet Take 81 mg by mouth every morning.    Marland Kitchen atorvastatin (LIPITOR) 80 MG tablet Take 40 mg by mouth at bedtime. 1/2 TAB DAILY    . Calcium Carbonate-Vitamin D (CALCIUM 600+D PO) Take 1 capsule by mouth daily.    . Cyanocobalamin (VITAMIN B 12 PO) Take by mouth daily.    Marland Kitchen docusate sodium (COLACE) 100 MG capsule Take 100 mg by mouth once as needed for mild constipation.     . ferrous sulfate 325 (65 FE) MG tablet Take 325 mg by mouth daily.    Marland Kitchen gabapentin (NEURONTIN) 300 MG capsule Take 300 mg by mouth daily.    . Homeopathic Products (ALLERGY MEDICINE PO) Take by mouth daily. 2-4 daily as needed for allergies    . hydrochlorothiazide (HYDRODIURIL) 25 MG tablet Take 25 mg by mouth every morning.    Marland Kitchen HYDROcodone-acetaminophen (NORCO/VICODIN) 5-325 MG per tablet Take 1-2 tablets by mouth every 6 (six) hours as needed for moderate pain. 30 tablet 0  . Iodoquinol-HC (HYDROCORTISONE-IODOQUINOL) 1-1 % CREA Apply 1 application topically daily as needed (rash).    . Multiple Vitamins-Minerals (PRESERVISION AREDS 2 PO) Take 2 capsules by mouth daily.    . nitroGLYCERIN (NITROSTAT) 0.4 MG SL tablet Place 0.4 mg under the tongue every 5 (five) minutes as needed for chest pain.    . Omega-3 Fatty Acids (FISH OIL) 1000 MG CAPS Take 1 capsule by mouth daily.    Marland Kitchen omeprazole (PRILOSEC) 20 MG capsule Take 20 mg by mouth daily.    . polycarbophil (FIBERCON) 625 MG tablet Take 1,250 mg by mouth 2 (two) times daily.     . potassium chloride SA (K-DUR,KLOR-CON) 20 MEQ tablet Take 40 mEq by mouth every  morning.    . Tetrahydrozoline HCl (VISINE OP)  Place 1 drop into both eyes once as needed (dry eyes.).    Marland Kitchen valsartan (DIOVAN) 160 MG tablet Take 160 mg by mouth every morning.              Current Facility-Administered Medications on File Prior to Visit  Medication Dose Route Frequency Provider Last Rate Last Dose  . 0.9 % sodium chloride infusion 500 mL Intravenous Continuous Danis, Estill Cotta III, MD     Review of Systems: a complete, 10pt review of systems was completed with pertinent positives and negatives as documented in the HPI  Physical Exam:  Vitals  Weight: 216.2 lb Height: 69 in  Body Surface Area: 2.14 m Body Mass Index: 31.93 kg/m  Temp.: 98.4 F Pulse: 80 (Regular)  BP: 130/75 (Sitting, Left Arm, Standard)  Gen: alert and well appearing  Eye: extraocular motion intact, no scleral icterus  Chest: unlabored respirations, symmetrical air entry, clear bilaterally  CV: regular rate and rhythm, no pedal edema  Abdomen: soft, nontender, nondistended. Subcentimeter umbilical hernia as well as a linear fascial defect within the vertical superior umbilical scar, which is within a approximately 5 cm diastases recti.  MSK: strength symmetrical throughout, no deformity  Neuro: grossly intact, normal gait  Psych: normal mood and affect, appropriate insight  Skin: warm and dry, no rash or lesion on limited exam  CBC Latest Ref Rng & Units 02/06/2014 02/06/2014 02/05/2014  WBC 4.0 - 10.5 K/uL - - -  Hemoglobin 13.0 - 17.0 g/dL 10.5(L) 10.1(L) 12.3(L)  Hematocrit 39.0 - 52.0 % 30.7(L) 28.9(L) 36.0(L)  Platelets 150 - 400 K/uL - - -   CMP Latest Ref Rng & Units 05/06/2018 01/30/2014 10/22/2013  Glucose 65 - 99 mg/dL 105(H) 92 -  BUN 8 - 27 mg/dL 16 21 -  Creatinine 0.76 - 1.27 mg/dL 0.96 1.07 1.20  Sodium 134 - 144 mmol/L 140 138 -  Potassium 3.5 - 5.2 mmol/L 3.8 4.6 -  Chloride 96 - 106 mmol/L 102 100 -  CO2 20 - 29 mmol/L 22 28 -  Calcium 8.6 - 10.2 mg/dL 9.0 9.3 -  Total Protein  6.0 - 8.5 g/dL 6.1 - -  Total Bilirubin 0.0 - 1.2 mg/dL 0.8 - -  Alkaline Phos 39 - 117 IU/L 98 - -  AST 0 - 40 IU/L 19 - -  ALT 0 - 44 IU/L 31 - -   Recent Labs                                  Imaging:  Imaging Results (Last 48 hours)    A/P: INCISIONAL HERNIA (K43.2)  Story: Small, minimally symptomatic. We discussed options for repair and I would recommend a laparoscopic approach. Discussed the technique of the surgery and risks of bleeding, infection, bleeding, scarring, injury to intra-abdominal structures, and hernia recurrence. Discussed the necessity of general anesthesia and associated cardiovascular, pulmonary and thromboembolic risks. Questions were welcomed and answered. He wishes to proceed with surgery. We will schedule pending cardiac clearance.      Patient Active Problem List   Diagnosis Date Noted  . Prostate cancer ([place]) 02/05/2014  . Malignant neoplasm of prostate ([place]) 11/26/2013  . Hypokalemia 08/14/2012  . Chest pain 08/13/2012  . CAD (coronary artery disease) 08/13/2012  . Hypertension 08/13/2012  . Dyslipidemia 08/13/2012  . GERD (gastroesophageal reflux disease) 08/13/2012  . Depression 08/13/2012  . OSA (obstructive sleep apnea) 08/13/2012   Wade Asebedo  Kae Heller, [facility], [place]

## 2019-04-28 NOTE — Discharge Instructions (Signed)
HERNIA REPAIR: POST OP INSTRUCTIONS  ######################################################################  EAT Gradually transition to a high fiber diet with a fiber supplement over the next few weeks after discharge.  Start with a pureed / full liquid diet (see below)  WALK Walk an hour a day.  Control your pain to do that.    CONTROL PAIN Control pain so that you can walk, sleep, tolerate sneezing/coughing, and go up/down stairs.  HAVE A BOWEL MOVEMENT DAILY Keep your bowels regular to avoid problems.  OK to try a laxative to override constipation.  OK to use an antidairrheal to slow down diarrhea.  Call if not better after 2 tries  CALL IF YOU HAVE PROBLEMS/CONCERNS Call if you are still struggling despite following these instructions. Call if you have concerns not answered by these instructions  ######################################################################    1. DIET: Follow a light bland diet & liquids the first 24 hours after arrival home, such as soup, liquids, starches, etc.  Be sure to drink plenty of fluids.  Quickly advance to a usual solid diet within a few days.  Avoid fast food or heavy meals as your are more likely to get nauseated or have irregular bowels.  A low-sugar, high-fiber diet for the rest of your life is ideal.   2. Take your usually prescribed home medications unless otherwise directed.  3. PAIN CONTROL: a. Pain is best controlled by a usual combination of three different methods TOGETHER: i. Ice/Heat ii. Over the counter pain medication iii. Prescription pain medication b. Most patients will experience some swelling and bruising around the hernia(s) such as the bellybutton, groins, or old incisions.  Ice packs or heating pads (30-60 minutes up to 6 times a day) will help. Use ice for the first few days to help decrease swelling and bruising, then switch to heat to help relax tight/sore spots and speed recovery.  Some people prefer to use ice  alone, heat alone, alternating between ice & heat.  Experiment to what works for you.  Swelling and bruising can take several weeks to resolve.   c. It is helpful to take an over-the-counter pain medication regularly for the first few days.  Choose one of the following that works best for you: i. Naproxen (Aleve, etc)  Two 220mg  tabs twice a day OR Ibuprofen (Advil, etc) Three 200mg  tabs four times a day (every meal & bedtime)  AND ii. Acetaminophen (Tylenol, etc) 325-650mg  four times a day (every meal & bedtime) d. A  prescription for pain medication should be given to you upon discharge.  Take your pain medication as prescribed.  i. If you are having problems/concerns with the prescription medicine (does not control pain, nausea, vomiting, rash, itching, etc), please call us [phone] to see if we need to switch you to a different pain medicine that will work better for you and/or control your side effect better. ii. If you need a refill on your pain medication, please contact your pharmacy.  They will contact our office to request authorization. Prescriptions will not be filled after 5 pm or on week-ends.  4. Avoid getting constipated.  Between the surgery and the pain medications, it is common to experience some constipation.  Increasing fluid intake and taking a fiber supplement (such as Metamucil, Citrucel, FiberCon, MiraLax, etc) 1-2 times a day regularly will usually help prevent this problem from occurring.  A mild laxative (prune juice, Milk of Magnesia, MiraLax, etc) should be taken according to package directions if there are no bowel movements  after 48 hours.    5. Wash / shower every day. Starting post-op day 2.  You may shower over the steri strips as they are waterproof.    6. Remove your bandaids 2 days after surgery. Steri strips will peel off after 1-2 weeks. You may replace a dressing/Band-Aid to cover the incision for comfort if you wish. You may leave the incisions open to air.   You may replace a dressing/Band-Aid to cover an incision for comfort if you wish.  Continue to shower over incision(s) after the dressing is off.  7. ACTIVITIES as tolerated:   a. You may resume regular (light) daily activities beginning the next day--such as daily self-care, walking, climbing stairs--gradually increasing activities as tolerated.  Control your pain so that you can walk an hour a day.  If you can walk 30 minutes without difficulty, it is safe to try more intense activity such as jogging, treadmill, bicycling, low-impact aerobics, swimming, etc. b. Refrain from the most intensive and strenuous activity as sit-ups, heavy lifting, contact sports, etc  Refrain from any heavy lifting or straining until 6 weeks after surgery.   c. DO NOT PUSH THROUGH PAIN.  Let pain be your guide: If it hurts to do something, don't do it.  Pain is your body warning you to avoid that activity for another week until the pain goes down. d. You may drive when you are no longer taking prescription pain medication, you can comfortably wear a seatbelt, and you can safely maneuver your car and apply brakes. e. Dennis Bast may have sexual intercourse when it is comfortable.   8. FOLLOW UP in our office a. Please call [facility] at [phone] to set up an appointment to see your surgeon in the office for a follow-up appointment approximately 2-3 weeks after your surgery. b. Make sure that you call for this appointment the day you arrive home to insure a convenient appointment time.  9.  If you have disability of FMLA / Family leave forms, please bring the forms to the office for processing.  (do not give to your surgeon).  WHEN TO CALL us [phone]: 1. Poor pain control 2. Reactions / problems with new medications (rash/itching, nausea, etc)  3. Fever over 101.5 F (38.5 C) 4. Inability to urinate 5. Nausea and/or vomiting 6. Worsening swelling or bruising 7. Continued bleeding from incision. 8. Increased pain,  redness, or drainage from the incision   The clinic staff is available to answer your questions during regular business hours (8:30am-5pm).  Please dont hesitate to call and ask to speak to one of our nurses for clinical concerns.   If you have a medical emergency, go to the nearest emergency room or call 911.  A surgeon from [facility] is always on call at the hospitals in [facility], [place], [place], [place], [place]  [address] ?  [address] [address], [place],    [address] MAIN: [phone] ? TOLL FREE: [phone] ? FAX: [phone] [email or web address]

## 2019-04-28 NOTE — Anesthesia Procedure Notes (Signed)
Procedure Name: Intubation Date/Time: 04/28/2019 7:15 AM Performed by: Victoriano Lain, CRNA Pre-anesthesia Checklist: Patient identified, Emergency Drugs available, Suction available, Patient being monitored and Timeout performed Patient Re-evaluated:Patient Re-evaluated prior to induction Oxygen Delivery Method: Circle system utilized Preoxygenation: Pre-oxygenation with 100% oxygen Induction Type: IV induction Ventilation: Mask ventilation without difficulty Laryngoscope Size: Mac Grade View: Grade I Tube type: Oral Tube size: 7.5 mm Number of attempts: 1 Airway Equipment and Method: Stylet Placement Confirmation: ETT inserted through vocal cords under direct vision and breath sounds checked- equal and bilateral Secured at: 22 cm Tube secured with: Tape Dental Injury: Teeth and Oropharynx as per pre-operative assessment

## 2019-04-28 NOTE — Transfer of Care (Signed)
Immediate Anesthesia Transfer of Care Note  Patient: Mark [name]  Procedure(s) Performed: LAPAROSCOPIC INCISIONAL HERNIA with mesh (N/A Abdomen)  Patient Location: PACU  Anesthesia Type:General  Level of Consciousness: awake, alert , oriented and patient cooperative  Airway & Oxygen Therapy: Patient Spontanous Breathing and Patient connected to face mask oxygen  Post-op Assessment: Report given to RN, Post -op Vital signs reviewed and stable and Patient moving all extremities  Post vital signs: Reviewed and stable  Last Vitals:  Vitals Value Taken Time  BP 161/85 04/28/19 0913  Temp    Pulse 87 04/28/19 0914  Resp 8 04/28/19 0914  SpO2 100 % 04/28/19 0914  Vitals shown include unvalidated device data.  Last Pain:  Vitals:   04/28/19 0602  TempSrc:   PainSc: 0-No pain         Complications: No apparent anesthesia complications

## 2019-04-28 NOTE — Anesthesia Postprocedure Evaluation (Signed)
Anesthesia Post Note  Patient: Mark [name]  Procedure(s) Performed: LAPAROSCOPIC INCISIONAL HERNIA with mesh (N/A Abdomen)     Patient location during evaluation: PACU Anesthesia Type: General Level of consciousness: awake and alert and oriented Pain management: pain level controlled Vital Signs Assessment: post-procedure vital signs reviewed and stable Respiratory status: spontaneous breathing, nonlabored ventilation and respiratory function stable Cardiovascular status: blood pressure returned to baseline and stable Postop Assessment: no apparent nausea or vomiting Anesthetic complications: no    Last Vitals:  Vitals:   04/28/19 1000 04/28/19 1015  BP:  (!) 154/80  Pulse:  82  Resp:  18  Temp: (!) 36.4 C (!) 36.3 C  SpO2:  95%    Last Pain:  Vitals:   04/28/19 1015  TempSrc:   PainSc: 5                  Zanae Kuehnle A.

## 2019-04-29 ENCOUNTER — Encounter: Payer: Self-pay | Admitting: *Deleted

## 2019-05-07 ENCOUNTER — Ambulatory Visit: Payer: Medicare Other | Admitting: Cardiovascular Disease

## 2019-05-29 ENCOUNTER — Telehealth: Payer: Self-pay | Admitting: Nurse Practitioner

## 2019-05-29 MED ORDER — VALSARTAN 160 MG PO TABS: 80.0000 mg | ORAL_TABLET | Freq: Every day | ORAL | 11 refills | Status: DC

## 2019-05-29 NOTE — Telephone Encounter (Signed)
I agree with the plan and note by Christen Bame, RN He will decrease his Valsartan by 1/2 to 80 mg a day      [name], MD  05/29/2019 3:59 PM    [place] [address],  [place], [place]  [address] Phone: [phone]; Fax: [phone]

## 2019-05-29 NOTE — Telephone Encounter (Signed)
Patient has been sending MyChart messages with questions about his BP medications. I called and left patient a message requesting he call back to discuss.

## 2019-05-29 NOTE — Telephone Encounter (Signed)
Patient returned my call. He states his BP has been running low since stopping HCTZ. He was advised to stop HCTZ after a hospitalization due to a low K+ but he does not know how low his K+ level was (most recent measurement is normal). Reports BP today at 0930 114/71 mmHg prior to taking valsartan 180 mg.  States last night at 2230, BP was 129/71 mmHg. He reports that he does have symptoms when SBP is <100. He recently lost 7 lbs during his hospitalization. I advised him to cut his valsartan in half and take 80 mg daily, continue to measure BP for 1 week and then send [place] a message with his recorded BPs. He has his annual wellness exam with Dr. Kenton Kingfisher on 3/31. He verbalized understanding and agreement with plan and thanked me for the call.

## 2019-06-12 ENCOUNTER — Ambulatory Visit: Payer: Medicare Other | Attending: Internal Medicine

## 2019-06-12 DIAGNOSIS — Z23 Encounter for immunization: Secondary | ICD-10-CM

## 2019-06-12 NOTE — Progress Notes (Signed)
   Covid-19 Vaccination Clinic  Name:  Mark [name]    MRN: [id number] DOB: 24-Aug-1940  06/12/2019  Mr. Quillan was observed post Covid-19 immunization for 15 minutes without incident. He was provided with Vaccine Information Sheet and instruction to access the V-Safe system.   Mr. Biagioni was instructed to call 911 with any severe reactions post vaccine: Marland Kitchen Difficulty breathing  . Swelling of face and throat  . A fast heartbeat  . A bad rash all over body  . Dizziness and weakness

## 2019-07-08 ENCOUNTER — Ambulatory Visit: Payer: No Typology Code available for payment source | Attending: Internal Medicine

## 2019-07-08 DIAGNOSIS — Z23 Encounter for immunization: Secondary | ICD-10-CM

## 2019-07-08 NOTE — Progress Notes (Signed)
   Covid-19 Vaccination Clinic  Name:  JUSTON RAIMONDO    MRN: [id number] DOB: October 08, 1940  07/08/2019  Mr. Bivens was observed post Covid-19 immunization for 15 minutes without incident. He was provided with Vaccine Information Sheet and instruction to access the V-Safe system.   Mr. Beld was instructed to call 911 with any severe reactions post vaccine: Marland Kitchen Difficulty breathing  . Swelling of face and throat  . A fast heartbeat  . A bad rash all over body  . Dizziness and weakness   Immunizations Administered    Name Date Dose VIS Date Route   Pfizer COVID-19 Vaccine 07/08/2019  4:12 PM 0.3 mL 03/21/2019 Intramuscular   Manufacturer: [place]   Lot: [id number]   [place]: [id number]

## 2020-07-22 ENCOUNTER — Encounter: Payer: Self-pay | Admitting: Cardiovascular Disease

## 2020-07-22 NOTE — Progress Notes (Signed)
Mark [name] Date of Birth  May 14, 1940       [place] [address], [place]    [place], [place]  [address]         Problem List: 1. CAD - PCI to LAD - 2002 Mark [name])  2. Hypertension 3. Hyperlipidemia 4.  Gastritis - mild GI bleeding     Mark [name] was recently admitted to the hospital with some CP.  Stress myoview was negative.   He is back doing his normal activities.   He swims regularly ( 3 times a week)    Dec. 10, 2014:  He has had some back pain  / sciatica.  No angina.   Able to do all of his normal activities without problems.   He is retired from [employer].    Oct. 13, 2015:  Mark [name] is doing well.  No CP,  On ASA alone  He was diagnosed with prostate cancer in March 2015.     He is off Testosterone supplement.   Now needs surgery.   He has elected to go with the [facility] robotic surgery.    He is splitting his metoprolol in half and taking twice a day.   He swims on a regular basis-perhaps 3 times a week. He swims for about 30-45 minutes and probably as much as one mild each day that he swims. He has no trouble climbing  1-2 flights of stairs. He is somewhat limited by his left ankle injury.  Had a stress Myoview study last year which showed:  IMPRESSION:  1. No scintigraphic evidence of prior infarction or  pharmacologically induced ischemia.  2. Normal wall motion. Ejection fraction - 71%.  Nov. 7, 2016:  Mark [name] is doing great. Swims 3 times a week .  No CP or dyspnea  Had robotic prostate surgery last year.  Is doing well with that .  Nov. 17, 2017:  Mark [name] is seen today for follow up of his CAD, HR is slow today   Tries to avoid salt Has some occasional episodes of orthostasis   Does not check his BP at home .   April 25, 2017: Doing well from a cardiac standpoint.   Has had some gastritis that seems to be related to his naproxyn .  No further GI bleeding . No CP or dyspnea Not getting regular exercise - needs  to get back to his swimming .  Has had some fatigue.  Maybe related to some anemia   Jan. 27, 2019:  Mark [name] is seen today for follow-up of his coronary artery disease. No CP , no dyspnea.   Exercises some,   Has slacked off his swimming now .   Has chronic ankle issues.   March 21, 2019:  Mark [name] is seen back today for follow-up of his coronary artery disease, hypertension, hyperlipidemia.  No cp or dyspnea.   Not much exercise.  Used to swim .  Has a hernia that will need to be repaired.   Will give clearence He is at low risk for his hernia repair.   He may hold his ASA for 5-7 days prior to surgery  Walks his dog regularly  Had labs at [place] from the [place] reveals a triglyceride level of 185.  The LDL is 36.  The HDL is 32.Marland Kitchen  Hemoglobin A1c is 5.5.  July 23, 2020: Mark [name] is seen today for follow up of his  CAD, HTN, HLD. Had some chest discomfort last year,  Went to the ER in [place].   Was thought to be anxiety Walks his dog  Has some ankle issues ,    Does not want to have surgery ,   Surgery would be difficult to heal up and would not absolutely solve his problems  bP has been elevated at home  Has lots of stress at home  BP is mildly elevated .  Is on Valsartan 80 mg a day and also on phenylephrine nasal spray as need    Current Outpatient Medications on File Prior to Visit  Medication Sig Dispense Refill  . acetaminophen (TYLENOL) 325 MG tablet Take 650 mg by mouth every 6 (six) hours as needed for moderate pain.     Marland Kitchen aspirin EC 81 MG tablet Take 81 mg by mouth every morning.    Marland Kitchen atorvastatin (LIPITOR) 80 MG tablet Take 40 mg by mouth at bedtime.     . Calcium Carbonate-Vitamin D (CALCIUM 600+D PO) Take 1 tablet by mouth daily.     . Cholecalciferol (VITAMIN D-3) 25 MCG (1000 UT) CAPS Take 1,000 Units by mouth daily.     . Cyanocobalamin (VITAMIN B 12 PO) Take 1 tablet by mouth daily.     . ferrous sulfate 325 (65 FE) MG tablet Take 325 mg by mouth  every Monday, Wednesday, and Friday.     . gabapentin (NEURONTIN) 300 MG capsule Take 300 mg by mouth 2 (two) times daily.     . Multiple Vitamins-Minerals (PRESERVISION AREDS 2 PO) Take 2 capsules by mouth daily.    . nitroGLYCERIN (NITROSTAT) 0.4 MG SL tablet Place 0.4 mg under the tongue every 5 (five) minutes as needed for chest pain.    . Omega-3 Fatty Acids (FISH OIL) 1000 MG CAPS Take 1,000 mg by mouth 2 (two) times daily.     Marland Kitchen Phenylephrine HCl (SINEX ULTRA FINE MIST REGULAR NA) Place 1 spray into both nostrils at bedtime.    . polycarbophil (FIBERCON) 625 MG tablet Take 1,250 mg by mouth daily.     . potassium chloride SA (K-DUR,KLOR-CON) 20 MEQ tablet Take 40 mEq by mouth daily.     . Tetrahydrozoline HCl (VISINE OP) Place 1 drop into both eyes every evening.    . valsartan (DIOVAN) 160 MG tablet Take 0.5 tablets (80 mg total) by mouth daily. 15 tablet 11   No current facility-administered medications on file prior to visit.  also taking Metoprolol 12.5 BID   Allergies  Allergen Reactions  . Codeine Itching and Hives  . Oxycodone-Acetaminophen Itching  . Percocet [Oxycodone-Acetaminophen] Itching    Past Medical History:  Diagnosis Date  . Allergy   . Anemia   . Anxiety   . Arthritis   . CAD (coronary artery disease) 2002   stent placement  . Cataract    removed left eye   . Depression   . Environmental allergies   . GERD (gastroesophageal reflux disease)   . History of skin cancer   . Hyperlipidemia   . Hypertension   . Macular degeneration   . 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 Prostate cancer ([place]) 06/17/13   gleason 3+4=7, vol 57.52 cc  . Prostate cancer ([place])   . Sciatica   . Sleep apnea    no longer needs c- pap per sleep study per  pt    Past Surgical History:  Procedure Laterality Date  . ANGIOPLASTY  2002   stent placement  . [place],  2002, 2005   lumbar fusion with hardware  . COLONOSCOPY    . [place]   left  . INCISIONAL HERNIA REPAIR N/A 04/28/2019   Procedure: LAPAROSCOPIC INCISIONAL HERNIA with mesh;  Surgeon: Clovis Riley, MD;  Location: [facility];  Service: General;  Laterality: N/A;  . LYMPHADENECTOMY Bilateral 02/05/2014   Procedure: Noel Journey;  Surgeon: Raynelle Bring, MD;  Location: [facility];  Service: Urology;  Laterality: Bilateral;  . POLYPECTOMY    . PROSTATE BIOPSY  06/17/13   gleason 7  . ROBOT ASSISTED LAPAROSCOPIC RADICAL PROSTATECTOMY N/A 02/05/2014   Procedure: ROBOTIC ASSISTED LAPAROSCOPIC RADICAL PROSTATECTOMY LEVEL 2;  Surgeon: Raynelle Bring, MD;  Location: [facility];  Service: Urology;  Laterality: N/A;    Social History   Tobacco Use  Smoking Status Former Smoker  . Packs/day: 0.50  . Years: 20.00  . Pack years: 10.00  . Types: Cigarettes  . Quit date: 05/21/1978  . Years since quitting: 42.2  Smokeless Tobacco Never Used    Social History   Substance and Sexual Activity  Alcohol Use Yes  . Alcohol/week: 2.0 - 3.0 standard drinks  . Types: 1 Glasses of wine, 1 - 2 Cans of beer per week   Comment: occasional    Family History  Problem Relation Age of Onset  . COPD Mother   . Diabetes Mother   . Hypertension Mother   . Heart disease Mother   . Breast cancer Mother   . Thyroid disease Mother   . Lung cancer Father   . CAD Brother 17       early  . Lung cancer Brother   . Hypertension Brother   . Hypertension Sister   . Diabetes Brother   . Heart attack Brother 25  . Colon cancer Neg Hx   . Stomach cancer Neg Hx   . Colon polyps Neg Hx   . Esophageal cancer Neg Hx   . Rectal cancer Neg Hx     Reviw of Systems:  Reviewed in the HPI.  All other systems are negative.  Physical Exam: Blood pressure (!) 142/82, pulse (!) 56, height 5\' 9"  (1.753 m), weight 218 lb (98.9 kg), SpO2 97 %.  GEN:  Well nourished, well developed in no acute distress HEENT:  Normal NECK: No JVD; No carotid bruits LYMPHATICS: No lymphadenopathy CARDIAC: RRR   RESPIRATORY:  Clear to auscultation without rales, wheezing or rhonchi  ABDOMEN: Soft, non-tender, non-distended MUSCULOSKELETAL:  No edema; No deformity  SKIN: Warm and dry NEUROLOGIC:  Alert and oriented x 3   ECG: July 23, 2020 code: Sinus bradycardia at 56.  Nonspecific ST and T wave changes.  Assessment / Plan:   1. CAD - PCI to LAD - 2002 Mark [name])  -   No angina   2. Hypertension -     BP is mildly elevated.   Admits that he eats too much salt.   Will work on reducing salt in his diet and also weight loss   3. Hyperlipidemia -   labs from his primary medical doctor from July 12, 2020 look good.  His LDL is 46.  HDL is 32.  Total cholesterol is 102.  Triglyceride levels 136.    [name], MD  07/23/2020 1:41 PM    [facility] [address]  [place], [place]  [address] Pager [phone] Phone: [phone]; Fax: [phone]

## 2020-07-23 ENCOUNTER — Other Ambulatory Visit: Payer: Self-pay

## 2020-07-23 ENCOUNTER — Ambulatory Visit: Payer: Medicare Other | Admitting: Cardiovascular Disease

## 2020-07-23 ENCOUNTER — Encounter: Payer: Self-pay | Admitting: Cardiovascular Disease

## 2020-07-23 VITALS — BP 142/82 | HR 56 | Ht 69.0 in | Wt 218.0 lb

## 2020-07-23 DIAGNOSIS — I2510 Atherosclerotic heart disease of native coronary artery without angina pectoris: Secondary | ICD-10-CM | POA: Diagnosis not present

## 2020-07-23 NOTE — Patient Instructions (Signed)
Medication Instructions:  Your physician recommends that you continue on your current medications as directed. Please refer to the Current Medication list given to you today.  *If you need a refill on your cardiac medications before your next appointment, please call your pharmacy*   Lab Work: none If you have labs (blood work) drawn today and your tests are completely normal, you will receive your results only by: Marland Kitchen MyChart Message (if you have MyChart) OR . A paper copy in the mail If you have any lab test that is abnormal or we need to change your treatment, we will call you to review the results.   Testing/Procedures: none   Follow-Up: At [facility], you and your health needs are our priority.  As part of our continuing mission to provide you with exceptional heart care, we have created designated Provider Care Teams.  These Care Teams include your primary Cardiologist (physician) and Advanced Practice Providers (APPs -  Physician Assistants and Nurse Practitioners) who all work together to provide you with the care you need, when you need it.   Your next appointment:   1 year(s)  The format for your next appointment:   In Person  Provider:   You may see [name], MD or one of the following Advanced Practice Providers on your designated Care Team:    Richardson Dopp, PA-C  Robbie Lis, [place]    Other Instructions  [email or web address]">  DASH Eating Plan DASH stands for Dietary Approaches to Stop Hypertension. The DASH eating plan is a healthy eating plan that has been shown to:  Reduce high blood pressure (hypertension).  Reduce your risk for type 2 diabetes, heart disease, and stroke.  Help with weight loss. What are tips for following this plan? Reading food labels  Check food labels for the amount of salt (sodium) per serving. Choose foods with less than 5 percent of the Daily Value of sodium. Generally, foods  with less than 300 milligrams (mg) of sodium per serving fit into this eating plan.  To find whole grains, look for the word "whole" as the first word in the ingredient list. Shopping  Buy products labeled as "low-sodium" or "no salt added."  Buy fresh foods. Avoid canned foods and pre-made or frozen meals. Cooking  Avoid adding salt when cooking. Use salt-free seasonings or herbs instead of table salt or sea salt. Check with your health care provider or pharmacist before using salt substitutes.  Do not fry foods. Cook foods using healthy methods such as baking, boiling, grilling, roasting, and broiling instead.  Cook with heart-healthy oils, such as olive, canola, avocado, soybean, or sunflower oil. Meal planning  Eat a balanced diet that includes: ? 4 or more servings of fruits and 4 or more servings of vegetables each day. Try to fill one-half of your plate with fruits and vegetables. ? 6-8 servings of whole grains each day. ? Less than 6 oz (170 g) of lean meat, poultry, or fish each day. A 3-oz (85-g) serving of meat is about the same size as a deck of cards. One egg equals 1 oz (28 g). ? 2-3 servings of low-fat dairy each day. One serving is 1 cup (237 mL). ? 1 serving of nuts, seeds, or beans 5 times each week. ? 2-3 servings of heart-healthy fats. Healthy fats called omega-3 fatty acids are found in foods such as walnuts, flaxseeds, fortified milks, and eggs. These fats are also found in cold-water fish, such as sardines, salmon, and mackerel.  Limit how much you eat of: ? Canned or prepackaged foods. ? Food that is high in trans fat, such as some fried foods. ? Food that is high in saturated fat, such as fatty meat. ? Desserts and other sweets, sugary drinks, and other foods with added sugar. ? Full-fat dairy products.  Do not salt foods before eating.  Do not eat more than 4 egg yolks a week.  Try to eat at least 2 vegetarian meals a week.  Eat more home-cooked food  and less restaurant, buffet, and fast food.   Lifestyle  When eating at a restaurant, ask that your food be prepared with less salt or no salt, if possible.  If you drink alcohol: ? Limit how much you use to:  0-1 drink a day for women who are not pregnant.  0-2 drinks a day for men. ? Be aware of how much alcohol is in your drink. In the [place] one drink equals one 12 oz bottle of beer (355 mL), one 5 oz glass of wine (148 mL), or one 1 oz glass of hard liquor (44 mL). General information  Avoid eating more than 2,300 mg of salt a day. If you have hypertension, you may need to reduce your sodium intake to 1,500 mg a day.  Work with your health care provider to maintain a healthy body weight or to lose weight. Ask what an ideal weight is for you.  Get at least 30 minutes of exercise that causes your heart to beat faster (aerobic exercise) most days of the week. Activities may include walking, swimming, or biking.  Work with your health care provider or dietitian to adjust your eating plan to your individual calorie needs. What foods should I eat? Fruits All fresh, dried, or frozen fruit. Canned fruit in natural juice (without added sugar). Vegetables Fresh or frozen vegetables (raw, steamed, roasted, or grilled). Low-sodium or reduced-sodium tomato and vegetable juice. Low-sodium or reduced-sodium tomato sauce and tomato paste. Low-sodium or reduced-sodium canned vegetables. Grains Whole-grain or whole-wheat bread. Whole-grain or whole-wheat pasta. Brown rice. [place]. Bulgur. Whole-grain and low-sodium cereals. Pita bread. Low-fat, low-sodium crackers. Whole-wheat flour tortillas. Meats and other proteins Skinless chicken or [place]. Ground chicken or [place]. Pork with fat trimmed off. Fish and seafood. Egg whites. Dried beans, peas, or lentils. Unsalted nuts, nut butters, and seeds. Unsalted canned beans. Lean cuts of beef with fat trimmed off. Low-sodium, lean precooked or  cured meat, such as sausages or meat loaves. Dairy Low-fat (1%) or fat-free (skim) milk. Reduced-fat, low-fat, or fat-free cheeses. Nonfat, low-sodium ricotta or cottage cheese. Low-fat or nonfat yogurt. Low-fat, low-sodium cheese. Fats and oils Soft margarine without trans fats. Vegetable oil. Reduced-fat, low-fat, or light mayonnaise and salad dressings (reduced-sodium). Canola, safflower, olive, avocado, soybean, and sunflower oils. Avocado. Seasonings and condiments Herbs. Spices. Seasoning mixes without salt. Other foods Unsalted popcorn and pretzels. Fat-free sweets. The items listed above may not be a complete list of foods and beverages you can eat. Contact a dietitian for more information. What foods should I avoid? Fruits Canned fruit in a light or heavy syrup. Fried fruit. Fruit in cream or butter sauce. Vegetables Creamed or fried vegetables. Vegetables in a cheese sauce. Regular canned vegetables (not low-sodium or reduced-sodium). Regular canned tomato sauce and paste (not low-sodium or reduced-sodium). Regular tomato and vegetable juice (not low-sodium or reduced-sodium). Angie Fava. Olives. Grains Baked goods made with fat, such as croissants, muffins, or some breads. Dry pasta or rice meal packs. Meats  and other proteins Fatty cuts of meat. Ribs. Fried meat. Berniece Salines. Bologna, salami, and other precooked or cured meats, such as sausages or meat loaves. Fat from the back of a pig (fatback). Bratwurst. Salted nuts and seeds. Canned beans with added salt. Canned or smoked fish. Whole eggs or egg yolks. Chicken or [place] with skin. Dairy Whole or 2% milk, cream, and half-and-half. Whole or full-fat cream cheese. Whole-fat or sweetened yogurt. Full-fat cheese. Nondairy creamers. Whipped toppings. Processed cheese and cheese spreads. Fats and oils Butter. Stick margarine. Lard. Shortening. Ghee. Bacon fat. Tropical oils, such as coconut, palm kernel, or palm oil. Seasonings and  condiments Onion salt, garlic salt, seasoned salt, table salt, and sea salt. Worcestershire sauce. Tartar sauce. Barbecue sauce. Teriyaki sauce. Soy sauce, including reduced-sodium. Steak sauce. Canned and packaged gravies. Fish sauce. Oyster sauce. Cocktail sauce. Store-bought horseradish. Ketchup. Mustard. Meat flavorings and tenderizers. Bouillon cubes. Hot sauces. Pre-made or packaged marinades. Pre-made or packaged taco seasonings. Relishes. Regular salad dressings. Other foods Salted popcorn and pretzels. The items listed above may not be a complete list of foods and beverages you should avoid. Contact a dietitian for more information. Where to find more information  National Heart, Lung, and Blood Institute: https://wilson-eaton.com/  American Heart Association: www.heart.org  Academy of Nutrition and Dietetics: www.eatright.[place]: www.kidney.org Summary  The DASH eating plan is a healthy eating plan that has been shown to reduce high blood pressure (hypertension). It may also reduce your risk for type 2 diabetes, heart disease, and stroke.  When on the DASH eating plan, aim to eat more fresh fruits and vegetables, whole grains, lean proteins, low-fat dairy, and heart-healthy fats.  With the DASH eating plan, you should limit salt (sodium) intake to 2,300 mg a day. If you have hypertension, you may need to reduce your sodium intake to 1,500 mg a day.  Work with your health care provider or dietitian to adjust your eating plan to your individual calorie needs. This information is not intended to replace advice given to you by your health care provider. Make sure you discuss any questions you have with your health care provider. Document Revised: 02/28/2019 Document Reviewed: 02/28/2019 Elsevier Patient Education  2021 [place] refers to food and lifestyle choices that are based on the traditions of countries  located on the [employer]. This way of eating has been shown to help prevent certain conditions and improve outcomes for people who have chronic diseases, like kidney disease and heart disease. What are tips for following this plan? Lifestyle  Cook and eat meals together with your family, when possible.  Drink enough fluid to keep your urine clear or pale yellow.  Be physically active every day. This includes: ? Aerobic exercise like running or swimming. ? Leisure activities like gardening, walking, or housework.  Get 7-8 hours of sleep each night.  If recommended by your health care provider, drink red wine in moderation. This means 1 glass a day for nonpregnant women and 2 glasses a day for men. A glass of wine equals 5 oz (150 mL). Reading food labels  Check the serving size of packaged foods. For foods such as rice and pasta, the serving size refers to the amount of cooked product, not dry.  Check the total fat in packaged foods. Avoid foods that have saturated fat or trans fats.  Check the ingredients list for added sugars, such as corn syrup.  Shopping  At the grocery store, buy most of your food from the areas near the walls of the store. This includes: ? Fresh fruits and vegetables (produce). ? Grains, beans, nuts, and seeds. Some of these may be available in unpackaged forms or large amounts (in bulk). ? Fresh seafood. ? Poultry and eggs. ? Low-fat dairy products.  Buy whole ingredients instead of prepackaged foods.  Buy fresh fruits and vegetables in-season from local farmers markets.  Buy frozen fruits and vegetables in resealable bags.  If you do not have access to quality fresh seafood, buy precooked frozen shrimp or canned fish, such as tuna, salmon, or sardines.  Buy small amounts of raw or cooked vegetables, salads, or olives from the deli or salad bar at your store.  Stock your pantry so you always have certain foods on hand, such as olive oil, canned  tuna, canned tomatoes, rice, pasta, and beans. Cooking  Cook foods with extra-virgin olive oil instead of using butter or other vegetable oils.  Have meat as a side dish, and have vegetables or grains as your main dish. This means having meat in small portions or adding small amounts of meat to foods like pasta or stew.  Use beans or vegetables instead of meat in common dishes like chili or lasagna.  Experiment with different cooking methods. Try roasting or broiling vegetables instead of steaming or sauteing them.  Add frozen vegetables to soups, stews, pasta, or rice.  Add nuts or seeds for added healthy fat at each meal. You can add these to yogurt, salads, or vegetable dishes.  Marinate fish or vegetables using olive oil, lemon juice, garlic, and fresh herbs. Meal planning  Plan to eat 1 vegetarian meal one day each week. Try to work up to 2 vegetarian meals, if possible.  Eat seafood 2 or more times a week.  Have healthy snacks readily available, such as: ? Vegetable sticks with hummus. ? [place] yogurt. ? Fruit and nut trail mix.  Eat balanced meals throughout the week. This includes: ? Fruit: 2-3 servings a day ? Vegetables: 4-5 servings a day ? Low-fat dairy: 2 servings a day ? Fish, poultry, or lean meat: 1 serving a day ? Beans and legumes: 2 or more servings a week ? Nuts and seeds: 1-2 servings a day ? Whole grains: 6-8 servings a day ? Extra-virgin olive oil: 3-4 servings a day  Limit red meat and sweets to only a few servings a month   What are my food choices?  Mediterranean diet ? Recommended  Grains: Whole-grain pasta. Brown rice. Bulgar wheat. Polenta. Couscous. Whole-wheat bread. [place].  Vegetables: Artichokes. Beets. Broccoli. Cabbage. Carrots. Eggplant. Green beans. Chard. Kale. Spinach. Onions. Leeks. Peas. Squash. Tomatoes. Peppers. Radishes.  Fruits: Apples. Apricots. Avocado. Berries. Bananas. Cherries. Dates. Figs. Grapes. Lemons.  Melon. Oranges. Peaches. Plums. Pomegranate.  Meats and other protein foods: Beans. Almonds. Sunflower seeds. Pine nuts. Peanuts. [place]. Salmon. Scallops. Shrimp. [place]. Tilapia. Clams. Oysters. Eggs.  Dairy: Low-fat milk. Cheese. Greek yogurt.  Beverages: Water. Red wine. Herbal tea.  Fats and oils: Extra virgin olive oil. Avocado oil. Grape seed oil.  Sweets and desserts: [place] yogurt with honey. Baked apples. Poached pears. Trail mix.  Seasoning and other foods: Basil. Cilantro. Coriander. Cumin. Mint. Parsley. Sage. Rosemary. Tarragon. Garlic. Oregano. Thyme. Pepper. Balsalmic vinegar. Tahini. Hummus. Tomato sauce. Olives. Mushrooms. ? Limit these  Grains: Prepackaged pasta or rice dishes. Prepackaged cereal with added sugar.  Vegetables: Deep fried potatoes (french fries).  Fruits: Fruit canned in syrup.  Meats and other protein foods: Beef. Pork. Lamb. Poultry with skin. Hot dogs. Berniece Salines.  Dairy: Ice cream. Sour cream. Whole milk.  Beverages: Juice. Sugar-sweetened soft drinks. Beer. Liquor and spirits.  Fats and oils: Butter. Canola oil. Vegetable oil. Beef fat (tallow). Lard.  Sweets and desserts: Cookies. Cakes. Pies. Candy.  Seasoning and other foods: Mayonnaise. Premade sauces and marinades. The items listed may not be a complete list. Talk with your dietitian about what dietary choices are right for you. Summary  The Mediterranean diet includes both food and lifestyle choices.  Eat a variety of fresh fruits and vegetables, beans, nuts, seeds, and whole grains.  Limit the amount of red meat and sweets that you eat.  Talk with your health care provider about whether it is safe for you to drink red wine in moderation. This means 1 glass a day for nonpregnant women and 2 glasses a day for men. A glass of wine equals 5 oz (150 mL). This information is not intended to replace advice given to you by your health care provider. Make sure you discuss any questions you have  with your health care provider. Document Revised: 11/25/2015 Document Reviewed: 11/18/2015 Elsevier Patient Education  [place].

## 2021-03-10 ENCOUNTER — Other Ambulatory Visit: Payer: Self-pay | Admitting: Urology

## 2021-03-23 NOTE — Patient Instructions (Signed)
DUE TO COVID-19 ONLY ONE VISITOR IS ALLOWED TO COME WITH YOU AND STAY IN THE WAITING ROOM ONLY DURING PRE OP AND PROCEDURE.   **NO VISITORS ARE ALLOWED IN THE SHORT STAY AREA OR RECOVERY ROOM!!**  IF YOU WILL BE ADMITTED INTO THE HOSPITAL YOU ARE ALLOWED ONLY TWO SUPPORT PEOPLE DURING VISITATION HOURS ONLY (7 AM -8PM)   The support person(s) must pass our screening, gel in and out, and wear a mask at all times, including in the patients room. Patients must also wear a mask when staff or their support person are in the room. Visitors GUEST BADGE MUST BE WORN VISIBLY  One adult visitor may remain with you overnight and MUST be in the room by 8 P.M.  No visitors under the age of 50. Any visitor under the age of 80 must be accompanied by an adult.    COVID SWAB TESTING MUST BE COMPLETED ON: 04/01/21  **MUST PRESENT COMPLETED FORM AT TESTING SITE**    [place] (backside of the building) You are not required to quarantine, however you are required to wear a well-fitted mask when you are out and around people not in your household.  Hand Hygiene often Do NOT share personal items Notify your provider if you are in close contact with someone who has COVID or you develop fever 100.4 or greater, new onset of sneezing, cough, sore throat, shortness of breath or body aches.  [address] [place], [address], must go inside of the hospital, NOT A DRIVE THRU!  (Must self quarantine after testing. Follow instructions on handout.)       Your procedure is scheduled on: 04/05/21   Report to [facility] Main Entrance    Report to short stay at : 5:15 AM   Call this number if you have problems the morning of surgery [phone]   Do not eat food :After Midnight.   May have liquids until : 4:30 AM   day of surgery  CLEAR LIQUID DIET  Foods Allowed                                                                      Foods Excluded  Water, Black Coffee and tea, regular and decaf                             liquids that you cannot  Plain Jell-O in any flavor  (No red)                                           see through such as: Fruit ices (not with fruit pulp)                                     milk, soups, orange juice              Iced Popsicles (No red)  All solid food                                   Apple juices Sports drinks like Gatorade (No red) Lightly seasoned clear broth or consume(fat free) Sugar  Sample Menu Breakfast                                Lunch                                     Supper Cranberry juice                    Beef broth                            Chicken broth Jell-O                                     Grape juice                           Apple juice Coffee or tea                        Jell-O                                      Popsicle                                                Coffee or tea                        Coffee or tea   Oral Hygiene is also important to reduce your risk of infection.                                    Remember - BRUSH YOUR TEETH THE MORNING OF SURGERY WITH YOUR REGULAR TOOTHPASTE   Do NOT smoke after Midnight   Take these medicines the morning of surgery with A SIP OF WATER: gabapentin,terbinafine.  DO NOT TAKE ANY ORAL DIABETIC MEDICATIONS DAY OF YOUR SURGERY                              You may not have any metal on your body including hair pins, jewelry, and body piercing             Do not wear lotions, powders, perfumes/cologne, or deodorant              Men may shave face and neck.   Do not bring valuables to the hospital. [place].   Contacts, dentures or  bridgework may not be worn into surgery.   Bring small overnight bag day of surgery.    Patients discharged on the day of surgery will not be allowed to drive  home.   Special Instructions: Bring a copy of your healthcare power of attorney and living will documents         the day of surgery if you haven't scanned them before.              Please read over the following fact sheets you were given: IF YOU HAVE QUESTIONS ABOUT YOUR PRE-OP INSTRUCTIONS PLEASE CALL [phone]     [facility] - Preparing for Surgery Before surgery, you can play an important role.  Because skin is not sterile, your skin needs to be as free of germs as possible.  You can reduce the number of germs on your skin by washing with CHG (chlorahexidine gluconate) soap before surgery.  CHG is an antiseptic cleaner which kills germs and bonds with the skin to continue killing germs even after washing. Please DO NOT use if you have an allergy to CHG or antibacterial soaps.  If your skin becomes reddened/irritated stop using the CHG and inform your nurse when you arrive at Short Stay. Do not shave (including legs and underarms) for at least 48 hours prior to the first CHG shower.  You may shave your face/neck. Please follow these instructions carefully:  1.  Shower with CHG Soap the night before surgery and the  morning of Surgery.  2.  If you choose to wash your hair, wash your hair first as usual with your  normal  shampoo.  3.  After you shampoo, rinse your hair and body thoroughly to remove the  shampoo.                           4.  Use CHG as you would any other liquid soap.  You can apply chg directly  to the skin and wash                       Gently with a scrungie or clean washcloth.  5.  Apply the CHG Soap to your body ONLY FROM THE NECK DOWN.   Do not use on face/ open                           Wound or open sores. Avoid contact with eyes, ears mouth and genitals (private parts).                       Wash face,  Genitals (private parts) with your normal soap.             6.  Wash thoroughly, paying special attention to the area where your surgery  will be performed.  7.   Thoroughly rinse your body with warm water from the neck down.  8.  DO NOT shower/wash with your normal soap after using and rinsing off  the CHG Soap.                9.  Pat yourself dry with a clean towel.            10.  Wear clean pajamas.            11.  Place clean sheets on your bed the night of your first shower and  do not  sleep with pets. Day of Surgery : Do not apply any lotions/deodorants the morning of surgery.  Please wear clean clothes to the hospital/surgery center.  FAILURE TO FOLLOW THESE INSTRUCTIONS MAY RESULT IN THE CANCELLATION OF YOUR SURGERY PATIENT SIGNATURE_________________________________  NURSE SIGNATURE__________________________________  ________________________________________________________________________

## 2021-03-24 ENCOUNTER — Encounter (HOSPITAL_COMMUNITY): Payer: Self-pay

## 2021-03-24 ENCOUNTER — Encounter (HOSPITAL_COMMUNITY)
Admission: RE | Admit: 2021-03-24 | Discharge: 2021-03-24 | Disposition: A | Discharge status: Home / Self Care | Payer: No Typology Code available for payment source | Source: Ambulatory Visit | Attending: Urology | Admitting: Urology

## 2021-03-24 ENCOUNTER — Other Ambulatory Visit: Payer: Self-pay

## 2021-03-24 DIAGNOSIS — Z87891 Personal history of nicotine dependence: Secondary | ICD-10-CM | POA: Insufficient documentation

## 2021-03-24 DIAGNOSIS — G4730 Sleep apnea, unspecified: Secondary | ICD-10-CM | POA: Insufficient documentation

## 2021-03-24 DIAGNOSIS — Z8546 Personal history of malignant neoplasm of prostate: Secondary | ICD-10-CM | POA: Insufficient documentation

## 2021-03-24 DIAGNOSIS — N393 Stress incontinence (female) (male): Secondary | ICD-10-CM | POA: Insufficient documentation

## 2021-03-24 DIAGNOSIS — I2510 Atherosclerotic heart disease of native coronary artery without angina pectoris: Secondary | ICD-10-CM | POA: Insufficient documentation

## 2021-03-24 DIAGNOSIS — I10 Essential (primary) hypertension: Secondary | ICD-10-CM | POA: Insufficient documentation

## 2021-03-24 DIAGNOSIS — Z01812 Encounter for preprocedural laboratory examination: Secondary | ICD-10-CM | POA: Diagnosis present

## 2021-03-24 DIAGNOSIS — K219 Gastro-esophageal reflux disease without esophagitis: Secondary | ICD-10-CM | POA: Diagnosis not present

## 2021-03-24 LAB — CBC
HCT: 46.4 % (ref 39.0–52.0)
Hemoglobin: 15.4 g/dL (ref 13.0–17.0)
MCH: 30.9 pg (ref 26.0–34.0)
MCHC: 33.2 g/dL (ref 30.0–36.0)
MCV: 93.2 fL (ref 80.0–100.0)
Platelets: 200 10*3/uL (ref 150–400)
RBC: 4.98 MIL/uL (ref 4.22–5.81)
RDW: 12.9 % (ref 11.5–15.5)
WBC: 5.6 10*3/uL (ref 4.0–10.5)
nRBC: 0 % (ref 0.0–0.2)

## 2021-03-24 LAB — BASIC METABOLIC PANEL WITH GFR
Anion gap: 6 (ref 5–15)
BUN: 20 mg/dL (ref 8–23)
CO2: 26 mmol/L (ref 22–32)
Calcium: 8.9 mg/dL (ref 8.9–10.3)
Chloride: 105 mmol/L (ref 98–111)
Creatinine, Ser: 0.91 mg/dL (ref 0.61–1.24)
GFR, Estimated: >60 mL/min
Glucose, Bld: 105 mg/dL — ABNORMAL HIGH (ref 70–99)
Potassium: 4.8 mmol/L (ref 3.5–5.1)
Sodium: 137 mmol/L (ref 135–145)

## 2021-03-24 NOTE — Progress Notes (Addendum)
COVID Vaccine Completed: Yes Date COVID Vaccine completed: 07/07/20 x 2 COVID vaccine manufacturer: Pfizer     COVID Test: 04/01/21 PCP - Dr. Shirline Frees Cardiologist - Dr. [name]  Chest x-ray -  EKG - 07/20/20 Stress Test -  ECHO -  Cardiac Cath -  Pacemaker/ICD device last checked:  Sleep Study - Yes CPAP - NO  Fasting Blood Sugar -  Checks Blood Sugar _____ times a day  Blood Thinner Instructions: Aspirin Instructions: Last Dose:  Anesthesia review: Hx: HTN,CAD,OSA(NO CPAP)  Patient denies shortness of breath, fever, cough and chest pain at PAT appointment   Patient verbalized understanding of instructions that were given to them at the PAT appointment. Patient was also instructed that they will need to review over the PAT instructions again at home before surgery.

## 2021-03-25 NOTE — Progress Notes (Signed)
Anesthesia Chart Review   Case: [id number] Date/Time: 04/05/21 0715   Procedure: MALE SLING - 30 MINS FOR THIS CASE   Anesthesia type: General   Pre-op diagnosis: STRESS INCONTINENCE   Location: [place] / [facility]   Surgeons: Bjorn Loser, MD       DISCUSSION:80 y.o. former smoker with h/o HTN, sleep apnea, GERD, CAD (PCI to LAD 2002), prostate cancer, stress incontinence scheduled for above procedure 04/05/2021 with Dr. Bjorn Loser.   Pt last seen by cardiology 07/23/2020. Stable at this visit.   Anticipate pt can proceed with planned procedure barring acute status change.   VS: BP 127/68    Pulse (!) 52    Temp 36.8 C (Oral)    Ht 5\' 9"  (1.753 m)    Wt 87.1 kg    SpO2 98%    BMI 28.35 kg/m   PROVIDERS: Shirline Frees, MD isi PCP   [name], MD is Cardiologist  LABS: Labs reviewed: Acceptable for surgery. (all labs ordered are listed, but only abnormal results are displayed)  Labs Reviewed  BASIC METABOLIC PANEL - Abnormal; Notable for the following components:      Result Value   Glucose, Bld 105 (*)    All other components within normal limits  CBC     IMAGES:   EKG: 07/23/2020 Rate 56 bpm  Sinus  CV:  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 rt lower leg / foot also has foot drop left foot wears brace   Prostate cancer ([facility]) 06/17/13   gleason 3+4=7, vol 57.52 cc   Prostate cancer ([place])    Sciatica    Sleep apnea    no longer needs c- pap per sleep study per pt    Past Surgical History:  Procedure Laterality Date   ANGIOPLASTY  2002   stent placement    [place], 2002, 2005   lumbar fusion with hardware   COLONOSCOPY     ELBOW SURGERY  1998   left   INCISIONAL HERNIA REPAIR N/A 04/28/2019   Procedure: LAPAROSCOPIC INCISIONAL HERNIA with mesh;  Surgeon: Clovis Riley, MD;  Location: [facility];  Service: General;  Laterality: N/A;   LYMPHADENECTOMY Bilateral 02/05/2014   Procedure: Noel Journey;  Surgeon: Raynelle Bring, MD;  Location: [facility];  Service: Urology;  Laterality: Bilateral;   POLYPECTOMY     PROSTATE BIOPSY  06/17/13   gleason 7   ROBOT ASSISTED LAPAROSCOPIC RADICAL PROSTATECTOMY N/A 02/05/2014   Procedure: ROBOTIC ASSISTED LAPAROSCOPIC RADICAL PROSTATECTOMY LEVEL 2;  Surgeon: Raynelle Bring, MD;  Location: [facility];  Service: Urology;  Laterality: N/A;    MEDICATIONS:  acetaminophen (TYLENOL) 500 MG tablet   aspirin EC 81 MG tablet   atorvastatin (LIPITOR) 40 MG tablet   Calcium Carbonate-Vitamin D (CALCIUM 600+D PO)   Cholecalciferol (VITAMIN D-3) 25 MCG (1000 UT) CAPS   diclofenac Sodium (VOLTAREN) 1 % GEL   diphenhydrAMINE (BENADRYL) 25 MG tablet   ferrous sulfate 325 (65 FE)  MG tablet   FIBER PO   gabapentin (NEURONTIN) 300 MG capsule   Multiple Vitamins-Minerals (PRESERVISION AREDS 2 PO)   nitroGLYCERIN (NITROSTAT) 0.4 MG SL tablet   Omega-3 Fatty Acids (FISH OIL) 1000 MG CAPS   Phenylephrine HCl (SINEX ULTRA FINE MIST REGULAR NA)   Polyvinyl Alcohol-Povidone (REFRESH OP)   potassium chloride (KLOR-CON) 10 MEQ tablet   terbinafine (LAMISIL) 250 MG tablet   valsartan (DIOVAN) 160 MG tablet   Vibegron (GEMTESA) 75 MG TABS   vitamin B-12 (CYANOCOBALAMIN) 500 MCG tablet   No current facility-administered medications for this encounter.    Konrad Felix Ward, PA-C [facility] [phone]

## 2021-03-29 NOTE — H&P (Signed)
I was consulted to assess the patient's urinary incontinence is robotic prostatectomy 5-7 years ago. He leaks sometimes with coughing sneezing and bending lifting. He leaks more when he is active. He sometimes has urge incontinence. He has no bedwetting. He wears 1 pad a day the so but into the day usually. He is doing physical therapy. He has to void almost every hour with urgency but now he hold it for 2 hours. He gets up once a night reports a good flow   He has had low back surgery. He has had an umbilical hernia repair post robotic surgery. He is left-handed. No diabetes.   in the standing position negative cough test. Normal male genitalia   Pathophysiology of incontinence and role of urodynamics and likely cystoscopy in the future discussed. Patient may be an excellent candidate for a male sling based upon severity of incontinence   we had a very good talk about the male sling. Other than complications of went over it and a lot of detail. He understands that if he does not get dry he may not reach his goal but he does soak 1 pad a day many days. He is leaning towards having urodynamics but he wants to think about it. He will call and I will schedule him. At age 80 watchful waiting he thought was reasonable as well. Will proceed accordingly   On urodynamics maximum bladder capacity was 324 mL. He had increased bladder sensation. Bladder was stable. At 200 mL he had mild leakage at a cuff leak point pressure of 70 cm of water and a Valsalva leak point pressure of 109 cm of water. At 300 mL's cough leak point pressure was 37 cm of water with mild leakage. When I reviewed the cystometrogram he may have been having some terminal bladder overactivity before he was given the permission to void. It was also marked as an involuntary void. He then voided off this contraction it appears. He voided 290 mL with a maximum flow of 20 mils per second. Maximum voiding pressure 18 cm of water. He emptied efficiently.  EMG activity within normal limits. Spinal hardware noted. Elongated bladder with bladder trabeculation noted. He also leaks a mild amount without awareness when his bladder was full. The details of the urodynamics are signed dictated   The patient primarily has mild stress incontinence based on the study but I believe also has some bladder overactivity noted. This correlates that sometimes he does have urge incontinence.   Patient underwent cystoscopy. The penile bulbar urethra normal. Bladder neck mildly coapting. Bladder mucosa and trigone were normal. Procedure well-tolerated   I gave him Myrbetriq 50 mg samples and prescription. I gave him oxybutynin ER 10 mg 30x11. I will see him in 8 weeks. If these medications do not reach his treatment goal we will discuss the male sling. I will mention artificial sphincter. He served in [employer] for 4-years   Today  Frequency stable. Incontinence stable.  Patient was voiding larger volumes and leaking more probably because of increased bladder capacity and oxybutynin. He never took the [employer]. I drew him a picture went over the male sling in detail. He understands a lot that we talked about before. We had a very good conversation. He understands the rare risk of worsening incontinence. We talked about rare risks of erosion and infection and sequelae. We talked injury other structures and sequelae. Showed an artificial sphincter with based upon his incontinence at the male sling with very good choice.  He now is up to 2 pads a day. He actually uses tissue as well to decrease his pad count.   I am not crystal-clear but he may or may not have taken the Myrbetriq so I gave him some Gemtesa samples. We will cancel surgery if he does well on them we will try to do surgery before Christmas if we can   I discussed flow symptoms in detail with the patient during the consent process. I will make sure that I tell him before surgery that in rare cases a man can have  retention that requires repeat surgery to loosen the sling and then he would buy back his urinary incontinence. This is especially the case with the newer sling     ALLERGIES: Codeine Derivatives    MEDICATIONS: Myrbetriq 50 mg tablet, extended release 24 hr 1 tablet PO Daily  Omeprazole  Oxybutynin Chloride Er 10 mg tablet, extended release 24 hr 1 tablet PO Daily  Sildenafil Citrate 100 mg tablet 1 tablet PO as directed PRN  Aspirin Ec 81 mg tablet, delayed release  Atorvastatin Calcium 80 mg tablet Oral  Citracal + D 250 mg calcium-5 mcg (200 unit) tablet Oral  Ferrous Sulfate  Fibercon 625 mg tablet Oral  Fish Oil CAPS Oral  Fluconazole 100 mg tablet Oral  Gabapentin  Nasonex 50 mcg/actuation aerosol, spray with pump 0 Nasal  Nitroglycerin 0.4 MG Sublingual Tablet Sublingual 0 Sublingual  Potassium Chloride 20 meq tablet, ext release, particles/crystals Oral  Preservision Areds  Tylenol  Vitamin D3 25 mcg (1,000 unit) tablet Oral     GU PSH: Complex cystometrogram, w/ void pressure and urethral pressure profile studies, any technique - 02/07/2021 Complex Uroflow - 02/07/2021 Cystoscopy - 02/17/2021 Emg surf Electrd - 02/07/2021 Inject For cystogram - 02/07/2021 Intrabd voidng Press - 02/07/2021 Laparoscopy; Lymphadenectomy - 2015 Robotic Radical Prostatectomy - 2015       PSH Notes: Laparoscopy With Bilateral Total Pelvic Lymphadenectomy, Prostatect Retropubic Radical W/ Nerve Sparing Laparoscopic, Cath Stent Placement, Back Surgery, Orthopedic Surgery   NON-GU PSH: Bmi>=30Or<22 Cal No Followup - 2019 Doc Meds Verified W/Pt Or Re - 2019 Musculoskeletal Surgery - 2015 Pain Neg No Plan - 2019     GU PMH: Mixed incontinence - 02/17/2021, - 06/11/2020, - 05/21/2020 Urinary Frequency - 02/17/2021 Stress Incontinence - 02/07/2021, - 06/11/2020, - 05/14/2020, - 04/16/2020, - 04/01/2020, - 03/26/2020, - 2019, - 2019, - 2019, - 2019, - 2019, Male stress incontinence, - 2017 Urge  incontinence - 06/11/2020, - 05/14/2020, - 04/16/2020, - 04/01/2020, - 2019 History of prostate cancer - 05/21/2020, - 03/26/2020, - 2018 Dysuria - 05/14/2020, Dysuria, - 2016 ED due to arterial insufficiency - 03/26/2020, Erectile dysfunction due to arterial insufficiency, - 2017 Prostate Cancer, Prostate cancer - 2017 Balanitis, Balanitis - 2016 Primary hypogonadism, Hypogonadism, male - 72      PMH Notes:   1) Prostate cancer: He is s/p treatment with a BNS RAL radical prostatectomy and BPLND on 02/05/14.   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 surgical margins  Pretreatment PSA: 5.57  Pretreatment SHIM: 8     NON-GU PMH: Muscle weakness (generalized) - 06/11/2020, - 05/14/2020, - 04/16/2020, - 04/01/2020 Other muscle spasm - 06/11/2020, - 05/14/2020, - 04/16/2020, - 04/01/2020 Other specified disorders of muscle - 06/11/2020, - 05/14/2020, - 04/16/2020, - 04/01/2020 Incisional hernia without obstruction or gangrene - 2019 Arthritis Coronary Artery Disease Hypercholesterolemia Hypertension Sleep Apnea    FAMILY HISTORY: Chronic Obstructive Pulmonary Disease - Runs In Family Hypertension -  Runs In Family Lung Cancer - Runs In Family   SOCIAL HISTORY: Marital Status: Married Preferred Language: English; Ethnicity: Not Hispanic Or Latino; Race: White Current Smoking Status: Patient does not smoke anymore.   Tobacco Use Assessment Completed: Used Tobacco in last 30 days? Does not drink anymore.  Drinks 1 caffeinated drink per day.     Notes: Former smoker, Living Situation: Supportive and safe, Exercise habits, Activities of daily living (ADL's), independent, Married, Former smoker, Occupation, Alcohol use   REVIEW OF SYSTEMS:    GU Review Male:   Patient reports frequent urination, hard to postpone urination, and leakage of urine. Patient denies burning/ pain with urination, get up at night to urinate, stream starts and stops, trouble starting your stream, have to strain  to urinate , erection problems, and penile pain.  Gastrointestinal (Upper):   Patient denies nausea, vomiting, and indigestion/ heartburn.  Gastrointestinal (Lower):   Patient denies diarrhea and constipation.  Constitutional:   Patient denies fever, night sweats, weight loss, and fatigue.  Skin:   Patient denies skin rash/ lesion and itching.  Eyes:   Patient denies blurred vision and double vision.  Ears/ Nose/ Throat:   Patient denies sore throat and sinus problems.  Hematologic/Lymphatic:   Patient denies swollen glands and easy bruising.  Cardiovascular:   Patient denies leg swelling and chest pains.  Respiratory:   Patient denies cough and shortness of breath.  Endocrine:   Patient denies excessive thirst.  Musculoskeletal:   Patient denies back pain and joint pain.  Neurological:   Patient denies headaches and dizziness.  Psychologic:   Patient denies depression and anxiety.   VITAL SIGNS: None   Complexity of Data:   03/19/20 03/25/19 03/13/18 03/09/17 08/04/16 02/02/16 07/29/15 01/21/15  PSA  Total PSA <0.015 ng/mL <0.015 ng/mL <0.015 ng/mL <0.015 ng/mL < 0.015 ng/dl < 0.02 ng/dl <0.01  <0.01     09/29/13  Hormones  Testosterone, Total 275     PROCEDURES:          Urinalysis - 81003 Dipstick Dipstick Cont'd  Color: Yellow Bilirubin: Neg  Appearance: Clear Ketones: Neg  Specific Gravity: 1.025 Blood: Neg  pH: 5.5 Protein: Neg  Glucose: Neg Urobilinogen: 0.2    Nitrites: Neg    Leukocyte Esterase: Neg    Notes:      ASSESSMENT:      ICD-10 Details  1 GU:   Mixed incontinence - N39.46   2   Urinary Frequency - R35.0               Notes:   I drew him a picture and we talked about a male sling in detail. Pros, cons, general surgical and anesthetic risks, and other options including behavioral therapy, the artificial sphincter, and watchful waiting were discussed. He understands that male slings are successful in 80-85% of cases for stress incontinence (dry in  approximately ), 50% for urge incontinence, and that in a small % of cases the incontinence can worsen. Surgical risks were described but not limited to the discussion of injury to neighboring structures including the bowel (with possible life-threatening sepsis and colostomy), bladder, urethra, and tendons/muscles (all resulting in further surgery). We talked about injury to nerves/soft tissue leading to debilitating and intractable pelvic, abdominal, and lower extremity pain syndromes and neuropathies. The risks of urinary retention requiring catheterization and slowing of urinary stream were discussed. The risk of urethral erosion and infection were discussed with sequelae. Bleeding risks with transfusion rates and risks of perineal  numbness and erectile dysfunction were discussed. The usual post-operative course was described. The patient understands that he might not reach his treatment goal and that he might be worse following surgery.

## 2021-04-01 ENCOUNTER — Other Ambulatory Visit: Payer: Self-pay | Admitting: Family Medicine

## 2021-04-01 LAB — SARS CORONAVIRUS 2 (TAT 6-24 HRS): SARS Coronavirus 2: NEGATIVE

## 2021-04-05 ENCOUNTER — Other Ambulatory Visit: Payer: Self-pay

## 2021-04-05 ENCOUNTER — Encounter (HOSPITAL_COMMUNITY): Payer: Self-pay | Admitting: Urology

## 2021-04-05 ENCOUNTER — Ambulatory Visit (HOSPITAL_COMMUNITY): Payer: No Typology Code available for payment source | Admitting: Certified Registered Nurse Anesthetist

## 2021-04-05 ENCOUNTER — Observation Stay (HOSPITAL_COMMUNITY)
Admission: RE | Admit: 2021-04-05 | Discharge: 2021-04-06 | Disposition: A | Discharge status: Home / Self Care | Payer: No Typology Code available for payment source | Source: Ambulatory Visit | Attending: Urology | Admitting: Urology

## 2021-04-05 ENCOUNTER — Encounter (HOSPITAL_COMMUNITY)
Admission: RE | Disposition: A | Discharge status: Home / Self Care | Payer: Self-pay | Source: Ambulatory Visit | Attending: Urology

## 2021-04-05 ENCOUNTER — Ambulatory Visit (HOSPITAL_COMMUNITY): Payer: No Typology Code available for payment source | Admitting: Physician Assistant

## 2021-04-05 DIAGNOSIS — I2510 Atherosclerotic heart disease of native coronary artery without angina pectoris: Secondary | ICD-10-CM | POA: Diagnosis not present

## 2021-04-05 DIAGNOSIS — Z87891 Personal history of nicotine dependence: Secondary | ICD-10-CM | POA: Diagnosis not present

## 2021-04-05 DIAGNOSIS — Z8546 Personal history of malignant neoplasm of prostate: Secondary | ICD-10-CM | POA: Diagnosis not present

## 2021-04-05 DIAGNOSIS — Z01818 Encounter for other preprocedural examination: Secondary | ICD-10-CM

## 2021-04-05 DIAGNOSIS — I10 Essential (primary) hypertension: Secondary | ICD-10-CM | POA: Diagnosis not present

## 2021-04-05 DIAGNOSIS — Z7982 Long term (current) use of aspirin: Secondary | ICD-10-CM | POA: Diagnosis not present

## 2021-04-05 DIAGNOSIS — N393 Stress incontinence (female) (male): Principal | ICD-10-CM | POA: Insufficient documentation

## 2021-04-05 DIAGNOSIS — Z79899 Other long term (current) drug therapy: Secondary | ICD-10-CM | POA: Insufficient documentation

## 2021-04-05 HISTORY — PX: URETHRAL SLING: SHX2621

## 2021-04-05 SURGERY — CREATION, URETHRAL SLING, MALE
Anesthesia: General

## 2021-04-05 MED ORDER — CEFAZOLIN SODIUM-DEXTROSE 2-4 GM/100ML-% IV SOLN
2.0000 g | INTRAVENOUS | Status: AC
  Administered 2021-04-05: 08:00:00 2 g via INTRAVENOUS
  Filled 2021-04-05: qty 100

## 2021-04-05 MED ORDER — DEXTROSE-NACL 5-0.45 % IV SOLN: INTRAVENOUS | Status: AC

## 2021-04-05 MED ORDER — CHLORHEXIDINE GLUCONATE 0.12 % MT SOLN
15.0000 mL | Freq: Once | OROMUCOSAL | Status: AC
  Administered 2021-04-05: 06:00:00 15 mL via OROMUCOSAL

## 2021-04-05 MED ORDER — NON FORMULARY
Status: DC | PRN
  Administered 2021-04-05: 09:00:00 1 mL via TOPICAL

## 2021-04-05 MED ORDER — SUGAMMADEX SODIUM 200 MG/2ML IV SOLN
INTRAVENOUS | Status: DC | PRN
  Administered 2021-04-05: 200 mg via INTRAVENOUS

## 2021-04-05 MED ORDER — 0.9 % SODIUM CHLORIDE (POUR BTL) OPTIME
TOPICAL | Status: DC | PRN
  Administered 2021-04-05: 08:00:00 1000 mL

## 2021-04-05 MED ORDER — DIPHENHYDRAMINE HCL 25 MG PO CAPS: 25.0000 mg | ORAL_CAPSULE | Freq: Four times a day (QID) | ORAL | Status: DC | PRN

## 2021-04-05 MED ORDER — LIDOCAINE 2% (20 MG/ML) 5 ML SYRINGE
INTRAMUSCULAR | Status: DC | PRN
  Administered 2021-04-05: 80 mg via INTRAVENOUS

## 2021-04-05 MED ORDER — SODIUM CHLORIDE 0.9% FLUSH: 3.0000 mL | INTRAVENOUS | Status: DC | PRN

## 2021-04-05 MED ORDER — ORAL CARE MOUTH RINSE: 15.0000 mL | Freq: Once | OROMUCOSAL | Status: AC

## 2021-04-05 MED ORDER — ACETAMINOPHEN 500 MG PO TABS: 1000.0000 mg | ORAL_TABLET | Freq: Once | ORAL | Status: DC | PRN

## 2021-04-05 MED ORDER — DOCUSATE SODIUM 100 MG PO CAPS
100.0000 mg | ORAL_CAPSULE | Freq: Two times a day (BID) | ORAL | Status: DC
  Administered 2021-04-05 – 2021-04-06 (×2): 100 mg via ORAL
  Filled 2021-04-05 (×2): qty 1

## 2021-04-05 MED ORDER — SODIUM CHLORIDE 0.9 % IV SOLN: 250.0000 mL | INTRAVENOUS | Status: DC | PRN

## 2021-04-05 MED ORDER — NITROGLYCERIN 0.4 MG SL SUBL: 0.4000 mg | SUBLINGUAL_TABLET | SUBLINGUAL | Status: DC | PRN

## 2021-04-05 MED ORDER — EPHEDRINE SULFATE-NACL 50-0.9 MG/10ML-% IV SOSY
PREFILLED_SYRINGE | INTRAVENOUS | Status: DC | PRN
  Administered 2021-04-05 (×2): 5 mg via INTRAVENOUS

## 2021-04-05 MED ORDER — SENNOSIDES-DOCUSATE SODIUM 8.6-50 MG PO TABS
2.0000 | ORAL_TABLET | Freq: Every day | ORAL | Status: DC
  Administered 2021-04-05: 22:00:00 2 via ORAL
  Filled 2021-04-05: qty 2

## 2021-04-05 MED ORDER — STERILE WATER FOR IRRIGATION IR SOLN
Status: DC | PRN
  Administered 2021-04-05: 3000 mL via INTRAVESICAL

## 2021-04-05 MED ORDER — DEXAMETHASONE SODIUM PHOSPHATE 10 MG/ML IJ SOLN
INTRAMUSCULAR | Status: DC | PRN
  Administered 2021-04-05: 5 mg via INTRAVENOUS

## 2021-04-05 MED ORDER — GENTAMICIN SULFATE 40 MG/ML IJ SOLN
5.0000 mg/kg | INTRAVENOUS | Status: AC
  Administered 2021-04-05: 08:00:00 440 mg via INTRAVENOUS
  Filled 2021-04-05: qty 11

## 2021-04-05 MED ORDER — LACTATED RINGERS IV SOLN: INTRAVENOUS | Status: DC

## 2021-04-05 MED ORDER — GABAPENTIN 300 MG PO CAPS
300.0000 mg | ORAL_CAPSULE | Freq: Two times a day (BID) | ORAL | Status: DC
  Administered 2021-04-05 – 2021-04-06 (×2): 300 mg via ORAL
  Filled 2021-04-05 (×2): qty 1

## 2021-04-05 MED ORDER — OXYCODONE HCL 5 MG PO TABS: 10.0000 mg | ORAL_TABLET | ORAL | Status: DC | PRN

## 2021-04-05 MED ORDER — IRBESARTAN 150 MG PO TABS
75.0000 mg | ORAL_TABLET | Freq: Every day | ORAL | Status: DC
  Administered 2021-04-06: 10:00:00 75 mg via ORAL
  Filled 2021-04-05: qty 1

## 2021-04-05 MED ORDER — ACETAMINOPHEN 160 MG/5ML PO SOLN: 1000.0000 mg | Freq: Once | ORAL | Status: DC | PRN

## 2021-04-05 MED ORDER — CHLORHEXIDINE GLUCONATE CLOTH 2 % EX PADS
6.0000 | MEDICATED_PAD | Freq: Every day | CUTANEOUS | Status: DC
  Administered 2021-04-05 – 2021-04-06 (×2): 6 via TOPICAL

## 2021-04-05 MED ORDER — FENTANYL CITRATE PF 50 MCG/ML IJ SOSY
PREFILLED_SYRINGE | INTRAMUSCULAR | Status: AC
  Administered 2021-04-05: 11:00:00 25 ug via INTRAVENOUS
  Filled 2021-04-05: qty 1

## 2021-04-05 MED ORDER — GLYCOPYRROLATE PF 0.2 MG/ML IJ SOSY
PREFILLED_SYRINGE | INTRAMUSCULAR | Status: DC | PRN
  Administered 2021-04-05: .1 mg via INTRAVENOUS

## 2021-04-05 MED ORDER — ONDANSETRON HCL 4 MG/2ML IJ SOLN: 4.0000 mg | INTRAMUSCULAR | Status: DC | PRN

## 2021-04-05 MED ORDER — ROCURONIUM BROMIDE 10 MG/ML (PF) SYRINGE
PREFILLED_SYRINGE | INTRAVENOUS | Status: DC | PRN
Start: 2021-04-05 — End: 2021-04-05
  Administered 2021-04-05: 50 mg via INTRAVENOUS

## 2021-04-05 MED ORDER — SODIUM CHLORIDE 0.9% FLUSH: 3.0000 mL | Freq: Two times a day (BID) | INTRAVENOUS | Status: DC

## 2021-04-05 MED ORDER — ACETAMINOPHEN 10 MG/ML IV SOLN
INTRAVENOUS | Status: AC
  Administered 2021-04-05: 10:00:00 1000 mg via INTRAVENOUS
  Filled 2021-04-05: qty 100

## 2021-04-05 MED ORDER — LIDOCAINE-EPINEPHRINE 1 %-1:100000 IJ SOLN
INTRAMUSCULAR | Status: AC
  Filled 2021-04-05: qty 1

## 2021-04-05 MED ORDER — PROPOFOL 10 MG/ML IV BOLUS
INTRAVENOUS | Status: AC
  Filled 2021-04-05: qty 20

## 2021-04-05 MED ORDER — EPHEDRINE 5 MG/ML INJ
INTRAVENOUS | Status: AC
  Filled 2021-04-05: qty 5

## 2021-04-05 MED ORDER — ACETAMINOPHEN 500 MG PO TABS
1000.0000 mg | ORAL_TABLET | Freq: Four times a day (QID) | ORAL | Status: DC
  Administered 2021-04-05 – 2021-04-06 (×3): 1000 mg via ORAL
  Filled 2021-04-05 (×4): qty 2

## 2021-04-05 MED ORDER — OXYCODONE HCL 5 MG PO TABS
5.0000 mg | ORAL_TABLET | ORAL | Status: DC | PRN
  Administered 2021-04-05 (×2): 5 mg via ORAL
  Filled 2021-04-05 (×3): qty 1

## 2021-04-05 MED ORDER — ACETAMINOPHEN 10 MG/ML IV SOLN: 1000.0000 mg | Freq: Once | INTRAVENOUS | Status: DC | PRN

## 2021-04-05 MED ORDER — ONDANSETRON HCL 4 MG/2ML IJ SOLN
INTRAMUSCULAR | Status: DC | PRN
  Administered 2021-04-05: 4 mg via INTRAVENOUS

## 2021-04-05 MED ORDER — PROPOFOL 10 MG/ML IV BOLUS
INTRAVENOUS | Status: DC | PRN
  Administered 2021-04-05: 150 mg via INTRAVENOUS

## 2021-04-05 MED ORDER — FENTANYL CITRATE (PF) 250 MCG/5ML IJ SOLN
INTRAMUSCULAR | Status: AC
  Filled 2021-04-05: qty 5

## 2021-04-05 MED ORDER — FENTANYL CITRATE PF 50 MCG/ML IJ SOSY
25.0000 ug | PREFILLED_SYRINGE | INTRAMUSCULAR | Status: DC | PRN
  Administered 2021-04-05: 10:00:00 25 ug via INTRAVENOUS

## 2021-04-05 MED ORDER — FENTANYL CITRATE (PF) 100 MCG/2ML IJ SOLN
INTRAMUSCULAR | Status: DC | PRN
  Administered 2021-04-05 (×4): 50 ug via INTRAVENOUS

## 2021-04-05 SURGICAL SUPPLY — 58 items
ADH SKN CLS APL DERMABOND .7 (GAUZE/BANDAGES/DRESSINGS) ×1
BAG COUNTER SPONGE SURGICOUNT (BAG) IMPLANT
BAG DRN RND TRDRP ANRFLXCHMBR (UROLOGICAL SUPPLIES) ×1
BAG SPNG CNTER NS LX DISP (BAG)
BAG SURGICOUNT SPONGE COUNTING (BAG)
BAG URINE DRAIN 2000ML AR STRL (UROLOGICAL SUPPLIES) ×4 IMPLANT
BLADE HEX COATED 2.75 (ELECTRODE) ×4 IMPLANT
BLADE SURG 15 STRL LF DISP TIS (BLADE) ×2 IMPLANT
BLADE SURG 15 STRL SS (BLADE) ×3
BLADE SURG SZ10 CARB STEEL (BLADE) ×4 IMPLANT
BNDG GAUZE ELAST 4 BULKY (GAUZE/BANDAGES/DRESSINGS) ×4 IMPLANT
CATH FOLEY 2WAY SLVR  5CC 14FR (CATHETERS) ×3
CATH FOLEY 2WAY SLVR 5CC 14FR (CATHETERS) ×2 IMPLANT
COVER MAYO STAND STRL (DRAPES) IMPLANT
COVER SURGICAL LIGHT HANDLE (MISCELLANEOUS) ×4 IMPLANT
DECANTER SPIKE VIAL GLASS SM (MISCELLANEOUS) ×4 IMPLANT
DERMABOND ADVANCED (GAUZE/BANDAGES/DRESSINGS) ×2
DERMABOND ADVANCED .7 DNX12 (GAUZE/BANDAGES/DRESSINGS) ×4 IMPLANT
DRAPE SHEET LG 3/4 BI-LAMINATE (DRAPES) ×4 IMPLANT
DRSG TELFA 3X8 NADH (GAUZE/BANDAGES/DRESSINGS) ×3 IMPLANT
GAUZE 4X4 16PLY ~~LOC~~+RFID DBL (SPONGE) ×8 IMPLANT
GLOVE SURG ENC TEXT LTX SZ7.5 (GLOVE) ×8 IMPLANT
GOWN STRL REUS W/TWL XL LVL3 (GOWN DISPOSABLE) ×4 IMPLANT
HEMOSTAT SURGICEL 4X8 (HEMOSTASIS) ×2 IMPLANT
HOLDER FOLEY CATH W/STRAP (MISCELLANEOUS) ×4 IMPLANT
KIT BASIN OR (CUSTOM PROCEDURE TRAY) ×4 IMPLANT
NDL SPNL 18GX3.5 QUINCKE PK (NEEDLE) IMPLANT
NDL SPNL 22GX3.5 QUINCKE BK (NEEDLE) IMPLANT
NEEDLE SPNL 18GX3.5 QUINCKE PK (NEEDLE) ×3 IMPLANT
NEEDLE SPNL 22GX3.5 QUINCKE BK (NEEDLE) ×3 IMPLANT
PACK CYSTO (CUSTOM PROCEDURE TRAY) ×4 IMPLANT
PAD DRESSING TELFA 3X8 NADH (GAUZE/BANDAGES/DRESSINGS) ×2 IMPLANT
PANTS MESH DISP LRG (UNDERPADS AND DIAPERS) ×2 IMPLANT
PANTS MESH DISPOSABLE L (UNDERPADS AND DIAPERS) ×2
PENCIL SMOKE EVACUATOR (MISCELLANEOUS) IMPLANT
PLUG CATH AND CAP STER (CATHETERS) ×4 IMPLANT
PROTECTOR NERVE ULNAR (MISCELLANEOUS) ×8 IMPLANT
SHEET LAVH (DRAPES) ×4 IMPLANT
SLING ADVANCE MALE SYSTEM (Mesh General) ×2 IMPLANT
SUT MNCRL AB 4-0 PS2 18 (SUTURE) ×4 IMPLANT
SUT SILK 2 0 30  PSL (SUTURE) ×3
SUT SILK 2 0 30 PSL (SUTURE) ×2 IMPLANT
SUT VIC AB 2-0 CT1 27 (SUTURE)
SUT VIC AB 2-0 CT1 27XBRD (SUTURE) IMPLANT
SUT VIC AB 2-0 SH 27 (SUTURE)
SUT VIC AB 2-0 SH 27X BRD (SUTURE) IMPLANT
SUT VIC AB 3-0 PS2 18 (SUTURE)
SUT VIC AB 3-0 PS2 18XBRD (SUTURE) IMPLANT
SUT VIC AB 3-0 SH 27 (SUTURE) ×21
SUT VIC AB 3-0 SH 27XBRD (SUTURE) ×6 IMPLANT
SUT VIC AB 4-0 PS2 27 (SUTURE) IMPLANT
SUT VIC AB 4-0 RB1 27 (SUTURE) ×6
SUT VIC AB 4-0 RB1 27XBRD (SUTURE) ×4 IMPLANT
SYR 10ML LL (SYRINGE) ×4 IMPLANT
TOWEL OR 17X26 10 PK STRL BLUE (TOWEL DISPOSABLE) ×8 IMPLANT
TUBING CONNECTING 10 (TUBING) ×1 IMPLANT
TUBING CONNECTING 10' (TUBING) ×1
YANKAUER SUCT BULB TIP 10FT TU (MISCELLANEOUS) ×2 IMPLANT

## 2021-04-05 NOTE — Plan of Care (Signed)
Problem: Health Behavior/Discharge Planning: Goal: Ability to manage health-related needs will improve Outcome: Completed/Met   Problem: Clinical Measurements: Goal: Ability to maintain clinical measurements within normal limits will improve Outcome: Progressing Goal: Will remain free from infection Outcome: Progressing Goal: Diagnostic test results will improve Outcome: Progressing Goal: Respiratory complications will improve Outcome: Progressing Goal: Cardiovascular complication will be avoided Outcome: Progressing   Problem: Activity: Goal: Risk for activity intolerance will decrease Outcome: Progressing   Problem: Nutrition: Goal: Adequate nutrition will be maintained Outcome: Progressing   Problem: Coping: Goal: Level of anxiety will decrease Outcome: Progressing   Problem: Elimination: Goal: Will not experience complications related to bowel motility Outcome: Progressing

## 2021-04-05 NOTE — Discharge Instructions (Signed)
Activity:  You are encouraged to ambulate frequently (about every hour during waking hours) to help prevent blood clots from forming in your legs or lungs.  However, you should not engage in any heavy lifting (> 10-15 lbs), strenuous activity, or straining for 4 weeks. Keep the scrotal support on for 48 hours, and wear tight fitting underwear thereafter for 1 week.   Diet: Regular diet  Prescriptions:  You will be provided a prescription for pain medication to take as needed.  If your pain is not severe enough to require the prescription pain medication, you may take extra strength Tylenol instead which will have less side effects. Ice works very well also.  You should also take a prescribed stool softener to avoid straining with bowel movements as the prescription pain medication may constipate you.  Incisions: The skin glue will fall off on its own in 1-2 weeks. The sutures underneath are absorbable. You may start showering (but not soaking or bathing in water) the 2nd day after surgery and the incisions simply need to be patted dry after the shower.  No additional care is needed.  What to call us about: You should call the office [phone]) if you develop fever > 101 or develop persistent vomiting. Call if you have significant bleeding from your incision, or large swelling of the scrotum.

## 2021-04-05 NOTE — Anesthesia Procedure Notes (Signed)
Procedure Name: Intubation Date/Time: 04/05/2021 7:52 AM Performed by: Gean Maidens, CRNA Pre-anesthesia Checklist: Patient identified, Emergency Drugs available, Suction available, Patient being monitored and Timeout performed Patient Re-evaluated:Patient Re-evaluated prior to induction Oxygen Delivery Method: Circle system utilized Preoxygenation: Pre-oxygenation with 100% oxygen Induction Type: IV induction Ventilation: Mask ventilation without difficulty Laryngoscope Size: Mac and 4 Grade View: Grade I Tube type: Oral Tube size: 7.5 mm Number of attempts: 1 Airway Equipment and Method: Stylet Placement Confirmation: ETT inserted through vocal cords under direct vision, positive ETCO2 and breath sounds checked- equal and bilateral Secured at: 23 cm Tube secured with: Tape Dental Injury: Teeth and Oropharynx as per pre-operative assessment

## 2021-04-05 NOTE — Transfer of Care (Signed)
Immediate Anesthesia Transfer of Care Note  Patient: Mark [name]  Procedure(s) Performed: MALE SLING  Patient Location: PACU  Anesthesia Type:General  Level of Consciousness: awake, alert  and oriented  Airway & Oxygen Therapy: Patient Spontanous Breathing and Patient connected to face mask oxygen  Post-op Assessment: Report given to RN and Post -op Vital signs reviewed and stable  Post vital signs: Reviewed and stable  Last Vitals:  Vitals Value Taken Time  BP 153/83 04/05/21 0947  Temp    Pulse 82 04/05/21 0948  Resp 19 04/05/21 0948  SpO2 100 % 04/05/21 0948  Vitals shown include unvalidated device data.  Last Pain:  Vitals:   04/05/21 0623  TempSrc: Oral  PainSc:       Patients Stated Pain Goal: 3 (01/64/29 0379)  Complications: No notable events documented.

## 2021-04-05 NOTE — Op Note (Signed)
Preoperative diagnosis: Stress urinary incontinence Postoperative diagnosis stress urinary incontinence Surgeon: Dr. Nicki Reaper Zohal Reny Assistand: Youseff   The patient has the above diagnosis and consented to the above procedure.  Extra care was taken with leg positioning to minimize the risk of compartment syndrome and neuropathy and deep vein thrombosis.  Preoperative antibiotics were given.  Patient had a mild narrowing of the pubic arch. A 14 French catheter was placed.  I made a 5 cm perineal incision carrying it down in the midline through the subcutaneous tissue.  I mobilized the subcutaneous tissue from the bulbospongiosus muscles.  I split the muscle in the midline and mobilized it from the bulbar urethra.  I could feel and see the perineal tendon.  I placed a 3-0 Vicryl at its insertion site.  I took down approximately 80% of the peroneal tendon allowing appropriate cephalad mobility of the bulb.  Palpably at the level of the bifurcation of the corporal bodies there was an elevation on the pubic rami approximately 1-1/2 cm high and 2 cm in length or longer.  It was mobile and likely is cartilage.  These protuberance were almost touching his moderate sized bulbar urethra and when I would place my fingers around the bulb I had to squeeze the bulb because my fingers were pushed medially by these protuberances.  I could still feel the triangle landmarks as described for the case and I thought it was safe to proceed  With the described technique I was very diligent in finding the upper medial aspect of the obturator foramen 1 full finger breath below the abductor tendon.  I checked this multiple times.  I used the foramen needle to locate the foramen.  I made a scalpel incision bilaterally.  With the described technique I passed the needle trocar first at a 45 degree angle.  A double pop maneuver was utilized.  My left index finger was in the upper aspect of the triangle.  To deliver the needle onto  the pulp of my finger I brought the handle of the trocar downward.  The trocar was easily passed safely through the triangle in the perineum.  The sling was attached and gently brought back through the foramen in the same angle and through the skin 4 to 5 cm.  The exact same procedure was done on the patient's right side.  I double checked and was no injury to soft tissues.  I gently tensioned the mesh sling down to the bulb laying relatively flat.  I used 4 fixation sutures of 3-0 Vicryl with 2 placed distally and 2 in the middle.  I brought the most proximal perineal body marking 3-0 Vicryl through the proximal aspect of the mesh and tied it later in the cases.  I tightened the mesh minimally and then cystoscoped the patient after removing the Foley catheter.  There was relative straightening of the bulbar urethra.  The bladder neck was mild to moderately open.  There was no injury to the urethra bladder neck or bladder.  Using simultaneous cystoscopy I placed a finger in the area of the mesh to help identify the site of coaptation.  It also helped me to tension in a more controlled fashion.  I took a few minutes to coapt the sphincter in this location just occluding the lumen and stopping.  I removed the scope and rescoped again.  I tensioned just a little bit more since he had a small dimple.  It was just coapting and I did not pull  tighter.  I stopped when the urethra was just completely coapted but did not tighten it further. The cystoscope was removed and the catheter was inserted easily.  There was no blood in the urine.   There was a minimal amount of bleeding in the area of the mesh deep in the perineum.  Visually the bulbar urethra had rotated cephalad typical of the procedure.  I decided to place a small piece of Surgicel in the site of the bleeding.  I then closed the perineum first the bulbospongiosus muscle with a running 3-0 Vicryl.  I then closed subcutaneous tissue with 3-0 Vicryl.  I  then did a third layer with the same suture.  I closed the skin with 4-0 Monocryl running suture.  The sling was cut below the blue dot.  It had been rolled a number of times to make it looser and easier to remove the sheath.  With the described technique I remove the sleeve and tyvex and repeated the same maneuver on the other side.  I cut the mesh below the skin and closed the skin with 4-0 interrupted Vicryl suture.  Sterile dressing was applied with fluff dressing and mesh pants.  Leg position was excellent.  Blood loss was less than 100 mL  I was very pleased with the surgery.  Anatomically he had a very good repair.  Hopefully this reaches his treatment goal

## 2021-04-05 NOTE — Interval H&P Note (Signed)
History and Physical Interval Note:  04/05/2021 7:09 AM  Mark [name]  has presented today for surgery, with the diagnosis of STRESS INCONTINENCE.  The various methods of treatment have been discussed with the patient and family. After consideration of risks, benefits and other options for treatment, the patient has consented to  Procedure(s) with comments: MALE SLING (N/A) - 90 MINS FOR THIS CASE as a surgical intervention.  The patient's history has been reviewed, patient examined, no change in status, stable for surgery.  I have reviewed the patient's chart and labs.  Questions were answered to the patient's satisfaction.     Shaughnessy Gethers A Jaideep Pollack

## 2021-04-05 NOTE — Progress Notes (Signed)
Pt ambulated length of hallway with standby assist. Tolerated activity well

## 2021-04-05 NOTE — Anesthesia Preprocedure Evaluation (Signed)
Anesthesia Evaluation  Patient identified by MRN, date of birth, ID band Patient awake    Reviewed: Allergy & Precautions, NPO status , Patient's Chart, lab work & pertinent test results  History of Anesthesia Complications Negative for: history of anesthetic complications  Airway Mallampati: I  TM Distance: >3 FB Neck ROM: Full    Dental  (+) Dental Advisory Given, Teeth Intact   Pulmonary neg shortness of breath, neg sleep apnea, neg COPD, neg recent URI, former smoker,    breath sounds clear to auscultation       Cardiovascular hypertension, Pt. on medications (-) angina+ CAD and + Cardiac Stents   Rhythm:Regular     Neuro/Psych PSYCHIATRIC DISORDERS Anxiety Depression  Neuromuscular disease    GI/Hepatic Neg liver ROS, GERD  ,  Endo/Other  negative endocrine ROS  Renal/GU Lab Results      Component                Value               Date                      CREATININE               0.91                03/24/2021                Musculoskeletal  (+) Arthritis ,   Abdominal   Peds  Hematology negative hematology ROS (+) Lab Results      Component                Value               Date                      WBC                      5.6                 03/24/2021                HGB                      15.4                03/24/2021                HCT                      46.4                03/24/2021                MCV                      93.2                03/24/2021                PLT                      200                 03/24/2021              Anesthesia Other Findings  Reproductive/Obstetrics                             Anesthesia Physical Anesthesia Plan  ASA: 2  Anesthesia Plan: General   Post-op Pain Management: Tylenol PO (pre-op) and Toradol IV (intra-op)   Induction: Intravenous  PONV Risk Score and Plan: 2 and Dexamethasone and Ondansetron  Airway  Management Planned: Oral ETT  Additional Equipment:   Intra-op Plan:   Post-operative Plan: Extubation in OR  Informed Consent: I have reviewed the patients History and Physical, chart, labs and discussed the procedure including the risks, benefits and alternatives for the proposed anesthesia with the patient or authorized representative who has indicated his/her understanding and acceptance.     Dental advisory given  Plan Discussed with: CRNA and Anesthesiologist  Anesthesia Plan Comments:         Anesthesia Quick Evaluation

## 2021-04-05 NOTE — Progress Notes (Signed)
No pain Legs good movement Discussed surgery Will check later today - resident

## 2021-04-05 NOTE — Progress Notes (Signed)
Pt ambulated length of hallway with no difficulty.

## 2021-04-06 ENCOUNTER — Other Ambulatory Visit (HOSPITAL_COMMUNITY): Payer: Self-pay

## 2021-04-06 ENCOUNTER — Encounter (HOSPITAL_COMMUNITY): Payer: Self-pay | Admitting: Urology

## 2021-04-06 DIAGNOSIS — N393 Stress incontinence (female) (male): Secondary | ICD-10-CM | POA: Diagnosis not present

## 2021-04-06 LAB — HEMOGLOBIN AND HEMATOCRIT, BLOOD
HCT: 39.4 % (ref 39.0–52.0)
Hemoglobin: 13.3 g/dL (ref 13.0–17.0)

## 2021-04-06 MED ORDER — ACETAMINOPHEN 500 MG PO TABS
1000.0000 mg | ORAL_TABLET | Freq: Four times a day (QID) | ORAL | 2 refills | Status: AC | PRN
  Filled 2021-04-06: qty 100, 13d supply, fill #0

## 2021-04-06 MED ORDER — SULFAMETHOXAZOLE-TRIMETHOPRIM 800-160 MG PO TABS
1.0000 | ORAL_TABLET | Freq: Two times a day (BID) | ORAL | 0 refills | Status: DC
  Filled 2021-04-06: qty 5, 3d supply, fill #0

## 2021-04-06 MED ORDER — DOCUSATE SODIUM 100 MG PO CAPS
100.0000 mg | ORAL_CAPSULE | Freq: Every day | ORAL | 2 refills | Status: AC | PRN
  Filled 2021-04-06: qty 30, 30d supply, fill #0

## 2021-04-06 MED ORDER — TRAMADOL HCL 50 MG PO TABS
50.0000 mg | ORAL_TABLET | Freq: Four times a day (QID) | ORAL | 0 refills | Status: DC | PRN
Start: 2021-04-06 — End: 2021-10-17
  Filled 2021-04-06: qty 10, 3d supply, fill #0

## 2021-04-06 NOTE — Anesthesia Postprocedure Evaluation (Signed)
Anesthesia Post Note  Patient: Mark [name]  Procedure(s) Performed: MALE SLING     Patient location during evaluation: PACU Anesthesia Type: General Level of consciousness: awake and alert Pain management: pain level controlled Vital Signs Assessment: post-procedure vital signs reviewed and stable Respiratory status: spontaneous breathing, nonlabored ventilation, respiratory function stable and patient connected to nasal cannula oxygen Cardiovascular status: blood pressure returned to baseline and stable Postop Assessment: no apparent nausea or vomiting Anesthetic complications: no   No notable events documented.  Last Vitals:  Vitals:   04/05/21 2353 04/06/21 0452  BP: (!) 122/59 126/61  Pulse: 60 (!) 57  Resp: 20 18  Temp: 36.9 C 36.6 C  SpO2: 100% 93%    Last Pain:  Vitals:   04/06/21 0452  TempSrc: Oral  PainSc: 0-No pain                 Shyla Gayheart

## 2021-04-06 NOTE — Progress Notes (Signed)
Order to discharge pt home.  Discharge instructions/AVS given to patient and reviewed - education provided as needed.  Pt advised to call PCP and/or come back to the hospital if there are any problems. Pt verbalized understanding.    

## 2021-04-06 NOTE — Progress Notes (Signed)
Pt voided, bladder scan completed, bladder empty.  Notified MD.  Order to discharge pt home.

## 2021-04-06 NOTE — Progress Notes (Signed)
No pain Bit sore in thighs from legs in stirrups; walking 3x well All incisions look good Mild purple scrotum- soft and supple Urine clear Vitals Ok Went over post op in detail and voiding trial and wound care

## 2021-04-06 NOTE — Plan of Care (Signed)
°  Problem: Education: Goal: Knowledge of General Education information will improve Description: Including pain rating scale, medication(s)/side effects and non-pharmacologic comfort measures Outcome: Adequate for Discharge   Problem: Clinical Measurements: Goal: Ability to maintain clinical measurements within normal limits will improve Outcome: Adequate for Discharge Goal: Will remain free from infection Outcome: Adequate for Discharge Goal: Diagnostic test results will improve Outcome: Adequate for Discharge Goal: Respiratory complications will improve Outcome: Adequate for Discharge Goal: Cardiovascular complication will be avoided Outcome: Adequate for Discharge   Problem: Activity: Goal: Risk for activity intolerance will decrease Outcome: Adequate for Discharge   Problem: Nutrition: Goal: Adequate nutrition will be maintained Outcome: Adequate for Discharge   Problem: Coping: Goal: Level of anxiety will decrease Outcome: Adequate for Discharge   Problem: Elimination: Goal: Will not experience complications related to bowel motility Outcome: Adequate for Discharge Goal: Will not experience complications related to urinary retention Outcome: Adequate for Discharge   Problem: Pain Managment: Goal: General experience of comfort will improve Outcome: Adequate for Discharge   Problem: Safety: Goal: Ability to remain free from injury will improve Outcome: Adequate for Discharge   Problem: Skin Integrity: Goal: Risk for impaired skin integrity will decrease Outcome: Adequate for Discharge   Problem: Education: Goal: Required Educational Video(s) Outcome: Adequate for Discharge   Problem: Clinical Measurements: Goal: Ability to maintain clinical measurements within normal limits will improve Outcome: Adequate for Discharge Goal: Postoperative complications will be avoided or minimized Outcome: Adequate for Discharge   Problem: Skin Integrity: Goal:  Demonstration of wound healing without infection will improve Outcome: Adequate for Discharge

## 2021-04-06 NOTE — Discharge Summary (Signed)
Date of admission: 04/05/2021  Date of discharge: 04/06/2021  Admission diagnosis: stress incontinence  Discharge diagnosis: stress incontinence  Secondary diagnoses: Ca of prostate  History and Physical: For full details, please see admission history and physical. Briefly, Mark [name] is a 80 y.o. year old patient with the above diagnosis.   Hospital Course: Male sling with good post op course  Laboratory values:  Recent Labs    04/06/21 0458  HGB 13.3  HCT 39.4   No results for input(s): CREATININE in the last 72 hours.  Disposition: Home  Discharge instruction: The patient was instructed to be ambulatory but told to refrain from heavy lifting, strenuous activity, or driving. Detailed  Discharge medications:  Allergies as of 04/06/2021       Reactions   Codeine Itching, Hives   Lisinopril Cough   Percocet [oxycodone-acetaminophen] Itching        Medication List     TAKE these medications    acetaminophen 500 MG tablet Commonly known as: TYLENOL Take 2 tablets (1,000 mg total) by mouth every 6 (six) hours as needed. What changed:  how much to take when to take this reasons to take this   aspirin EC 81 MG tablet Take 81 mg by mouth every morning.   atorvastatin 40 MG tablet Commonly known as: LIPITOR Take 40 mg by mouth daily.   CALCIUM 600+D PO Take 1 tablet by mouth daily.   diclofenac Sodium 1 % Gel Commonly known as: VOLTAREN Apply 1 application topically 4 (four) times daily as needed (pain).   diphenhydrAMINE 25 MG tablet Commonly known as: BENADRYL Take 25 mg by mouth every 6 (six) hours as needed for allergies.   docusate sodium 100 MG capsule Commonly known as: Colace Take 1 capsule (100 mg total) by mouth daily as needed.   ferrous sulfate 325 (65 FE) MG tablet Take 325 mg by mouth every other day.   FIBER PO Take 1 capsule by mouth 2 (two) times daily.   Fish Oil 1000 MG Caps Take 1,000 mg by mouth 2 (two) times daily.    gabapentin 300 MG capsule Commonly known as: NEURONTIN Take 300 mg by mouth 2 (two) times daily.   Gemtesa 75 MG Tabs Generic drug: Vibegron Take 75 mg by mouth daily.   nitroGLYCERIN 0.4 MG SL tablet Commonly known as: NITROSTAT Place 0.4 mg under the tongue every 5 (five) minutes as needed for chest pain.   potassium chloride 10 MEQ tablet Commonly known as: KLOR-CON Take 10 mEq by mouth 2 (two) times daily.   PRESERVISION AREDS 2 PO Take 1 capsule by mouth 2 (two) times daily.   REFRESH OP Place 1 drop into both eyes daily as needed (dry eyes).   SINEX ULTRA FINE MIST REGULAR NA Place 1 spray into both nostrils daily as needed (congestion).   sulfamethoxazole-trimethoprim 800-160 MG tablet Commonly known as: BACTRIM DS Take 1 tablet by mouth 2 (two) times daily.   terbinafine 250 MG tablet Commonly known as: LAMISIL Take 250 mg by mouth daily.   traMADol 50 MG tablet Commonly known as: Ultram Take 1 tablet (50 mg total) by mouth every 6 (six) hours as needed.   valsartan 160 MG tablet Commonly known as: DIOVAN Take 0.5 tablets (80 mg total) by mouth daily. What changed: how much to take   vitamin B-12 500 MCG tablet Commonly known as: CYANOCOBALAMIN Take 500 mcg by mouth daily.   Vitamin D-3 25 MCG (1000 UT) Caps Take 1,000 Units  by mouth daily.        Followup:

## 2021-04-06 NOTE — Progress Notes (Signed)
Discontinued foley catheter per order.  Pt to call when he voids.  RN/NT will perform bladder scan after pt voids.  Pt verbalized understanding.  Will continue to monitor.

## 2021-04-18 NOTE — H&P (Signed)
I was consulted to assess the patient's urinary incontinence is robotic prostatectomy 5-7 years ago. He leaks sometimes with coughing sneezing and bending lifting. He leaks more when he is active. He sometimes has urge incontinence. He has no bedwetting. He wears 1 pad a day the so but into the day usually. He is doing physical therapy. He has to void almost every hour with urgency but now he hold it for 2 hours. He gets up once a night reports a good flow    He has had low back surgery. He has had an umbilical hernia repair post robotic surgery. He is left-handed. No diabetes.    in the standing position negative cough test. Normal male genitalia    Pathophysiology of incontinence and role of urodynamics and likely cystoscopy in the future discussed. Patient may be an excellent candidate for a male sling based upon severity of incontinence    we had a very good talk about the male sling. Other than complications of went over it and a lot of detail. He understands that if he does not get dry he may not reach his goal but he does soak 1 pad a day many days. He is leaning towards having urodynamics but he wants to think about it. He will call and I will schedule him. At age 39 watchful waiting he thought was reasonable as well. Will proceed accordingly    On urodynamics maximum bladder capacity was 324 mL. He had increased bladder sensation. Bladder was stable. At 200 mL he had mild leakage at a cuff leak point pressure of 70 cm of water and a Valsalva leak point pressure of 109 cm of water. At 300 mL's cough leak point pressure was 37 cm of water with mild leakage. When I reviewed the cystometrogram he may have been having some terminal bladder overactivity before he was given the permission to void. It was also marked as an involuntary void. He then voided off this contraction it appears. He voided 290 mL with a maximum flow of 20 mils per second. Maximum voiding pressure 18 cm of water. He emptied  efficiently. EMG activity within normal limits. Spinal hardware noted. Elongated bladder with bladder trabeculation noted. He also leaks a mild amount without awareness when his bladder was full. The details of the urodynamics are signed dictated    The patient primarily has mild stress incontinence based on the study but I believe also has some bladder overactivity noted. This correlates that sometimes he does have urge incontinence.    Patient underwent cystoscopy. The penile bulbar urethra normal. Bladder neck mildly coapting. Bladder mucosa and trigone were normal. Procedure well-tolerated    I gave him Myrbetriq 50 mg samples and prescription. I gave him oxybutynin ER 10 mg 30x11. I will see him in 8 weeks. If these medications do not reach his treatment goal we will discuss the male sling. I will mention artificial sphincter. He served in [employer] for 4-years    Today  Frequency stable. Incontinence stable.  Patient was voiding larger volumes and leaking more probably because of increased bladder capacity and oxybutynin. He never took the [employer]. I drew him a picture went over the male sling in detail. He understands a lot that we talked about before. We had a very good conversation. He understands the rare risk of worsening incontinence. We talked about rare risks of erosion and infection and sequelae. We talked injury other structures and sequelae. Showed an artificial sphincter with based upon his  incontinence at the male sling with very good choice. He now is up to 2 pads a day. He actually uses tissue as well to decrease his pad count.    I am not crystal-clear but he may or may not have taken the Myrbetriq so I gave him some Gemtesa samples. We will cancel surgery if he does well on them we will try to do surgery before Christmas if we can    I discussed flow symptoms in detail with the patient during the consent process. I will make sure that I tell him before surgery that in rare  cases a man can have retention that requires repeat surgery to loosen the sling and then he would buy back his urinary incontinence. This is especially the case with the newer sling      ALLERGIES: Codeine Derivatives     MEDICATIONS: Myrbetriq 50 mg tablet, extended release 24 hr 1 tablet PO Daily  Omeprazole  Oxybutynin Chloride Er 10 mg tablet, extended release 24 hr 1 tablet PO Daily  Sildenafil Citrate 100 mg tablet 1 tablet PO as directed PRN  Aspirin Ec 81 mg tablet, delayed release  Atorvastatin Calcium 80 mg tablet Oral  Citracal + D 250 mg calcium-5 mcg (200 unit) tablet Oral  Ferrous Sulfate  Fibercon 625 mg tablet Oral  Fish Oil CAPS Oral  Fluconazole 100 mg tablet Oral  Gabapentin  Nasonex 50 mcg/actuation aerosol, spray with pump 0 Nasal  Nitroglycerin 0.4 MG Sublingual Tablet Sublingual 0 Sublingual  Potassium Chloride 20 meq tablet, ext release, particles/crystals Oral  Preservision Areds  Tylenol  Vitamin D3 25 mcg (1,000 unit) tablet Oral      GU PSH: Complex cystometrogram, w/ void pressure and urethral pressure profile studies, any technique - 02/07/2021 Complex Uroflow - 02/07/2021 Cystoscopy - 02/17/2021 Emg surf Electrd - 02/07/2021 Inject For cystogram - 02/07/2021 Intrabd voidng Press - 02/07/2021 Laparoscopy; Lymphadenectomy - 2015 Robotic Radical Prostatectomy - 2015        PSH Notes: Laparoscopy With Bilateral Total Pelvic Lymphadenectomy, Prostatect Retropubic Radical W/ Nerve Sparing Laparoscopic, Cath Stent Placement, Back Surgery, Orthopedic Surgery    NON-GU PSH: Bmi>=30Or<22 Cal No Followup - 2019 Doc Meds Verified W/Pt Or Re - 2019 Musculoskeletal Surgery - 2015 Pain Neg No Plan - 2019       GU PMH: Mixed incontinence - 02/17/2021, - 06/11/2020, - 05/21/2020 Urinary Frequency - 02/17/2021 Stress Incontinence - 02/07/2021, - 06/11/2020, - 05/14/2020, - 04/16/2020, - 04/01/2020, - 03/26/2020, - 2019, - 2019, - 2019, - 2019, - 2019, Male stress  incontinence, - 2017 Urge incontinence - 06/11/2020, - 05/14/2020, - 04/16/2020, - 04/01/2020, - 2019 History of prostate cancer - 05/21/2020, - 03/26/2020, - 2018 Dysuria - 05/14/2020, Dysuria, - 2016 ED due to arterial insufficiency - 03/26/2020, Erectile dysfunction due to arterial insufficiency, - 2017 Prostate Cancer, Prostate cancer - 2017 Balanitis, Balanitis - 2016 Primary hypogonadism, Hypogonadism, male - 64      PMH Notes:    1) Prostate cancer: He is s/p treatment with a BNS RAL radical prostatectomy and BPLND on 02/05/14.    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 surgical margins  Pretreatment PSA: 5.57  Pretreatment SHIM: 8      NON-GU PMH: Muscle weakness (generalized) - 06/11/2020, - 05/14/2020, - 04/16/2020, - 04/01/2020 Other muscle spasm - 06/11/2020, - 05/14/2020, - 04/16/2020, - 04/01/2020 Other specified disorders of muscle - 06/11/2020, - 05/14/2020, - 04/16/2020, - 04/01/2020 Incisional hernia without obstruction or gangrene - 2019 Arthritis  Coronary Artery Disease Hypercholesterolemia Hypertension Sleep Apnea     FAMILY HISTORY: Chronic Obstructive Pulmonary Disease - Runs In Family Hypertension - Runs In Family Lung Cancer - Runs In Family    SOCIAL HISTORY: Marital Status: Married Preferred Language: English; Ethnicity: Not Hispanic Or Latino; Race: White Current Smoking Status: Patient does not smoke anymore.    Tobacco Use Assessment Completed: Used Tobacco in last 30 days? Does not drink anymore.  Drinks 1 caffeinated drink per day.     Notes: Former smoker, Living Situation: Supportive and safe, Exercise habits, Activities of daily living (ADL's), independent, Married, Former smoker, Occupation, Alcohol use    REVIEW OF SYSTEMS:    GU Review Male:   Patient reports frequent urination, hard to postpone urination, and leakage of urine. Patient denies burning/ pain with urination, get up at night to urinate, stream starts and stops, trouble  starting your stream, have to strain to urinate , erection problems, and penile pain.  Gastrointestinal (Upper):   Patient denies nausea, vomiting, and indigestion/ heartburn.  Gastrointestinal (Lower):   Patient denies diarrhea and constipation.  Constitutional:   Patient denies fever, night sweats, weight loss, and fatigue.  Skin:   Patient denies skin rash/ lesion and itching.  Eyes:   Patient denies blurred vision and double vision.  Ears/ Nose/ Throat:   Patient denies sore throat and sinus problems.  Hematologic/Lymphatic:   Patient denies swollen glands and easy bruising.  Cardiovascular:   Patient denies leg swelling and chest pains.  Respiratory:   Patient denies cough and shortness of breath.  Endocrine:   Patient denies excessive thirst.  Musculoskeletal:   Patient denies back pain and joint pain.  Neurological:   Patient denies headaches and dizziness.  Psychologic:   Patient denies depression and anxiety.    VITAL SIGNS: None    Complexity of Data:   03/19/20 03/25/19 03/13/18 03/09/17 08/04/16 02/02/16 07/29/15 01/21/15  PSA  Total PSA <0.015 ng/mL <0.015 ng/mL <0.015 ng/mL <0.015 ng/mL < 0.015 ng/dl < 0.02 ng/dl <0.01  <0.01      09/29/13  Hormones  Testosterone, Total 275       PROCEDURES:           Urinalysis - 81003 Dipstick Dipstick Cont'd  Color: Yellow Bilirubin: Neg  Appearance: Clear Ketones: Neg  Specific Gravity: 1.025 Blood: Neg  pH: 5.5 Protein: Neg  Glucose: Neg Urobilinogen: 0.2    Nitrites: Neg    Leukocyte Esterase: Neg      Notes:        ASSESSMENT:      ICD-10 Details  1 GU:   Mixed incontinence - N39.46   2   Urinary Frequency - R35.0               Notes:   I drew him a picture and we talked about a male sling in detail. Pros, cons, general surgical and anesthetic risks, and other options including behavioral therapy, the artificial sphincter, and watchful waiting were discussed. He understands that male slings are successful in  80-85% of cases for stress incontinence (dry in approximately ), 50% for urge incontinence, and that in a small % of cases the incontinence can worsen. Surgical risks were described but not limited to the discussion of injury to neighboring structures including the bowel (with possible life-threatening sepsis and colostomy), bladder, urethra, and tendons/muscles (all resulting in further surgery). We talked about injury to nerves/soft tissue leading to debilitating and intractable pelvic, abdominal, and lower extremity pain syndromes  and neuropathies. The risks of urinary retention requiring catheterization and slowing of urinary stream were discussed. The risk of urethral erosion and infection were discussed with sequelae. Bleeding risks with transfusion rates and risks of perineal numbness and erectile dysfunction were discussed. The usual post-operative course was described. The patient understands that he might not reach his treatment goal and that he might be worse following surgery.

## 2021-10-16 ENCOUNTER — Encounter: Payer: Self-pay | Admitting: Cardiovascular Disease

## 2021-10-16 NOTE — Progress Notes (Signed)
Mark [name] Date of Birth  May 14, 1940       [facility] [address], [place]    [place], [place]  [address]         Problem List: 1. CAD - PCI to LAD - 2002 Linard Millers)  2. Hypertension 3. Hyperlipidemia 4.  Gastritis - mild GI bleeding     Mark [name] was recently admitted to the hospital with some CP.  Stress myoview was negative.   He is back doing his normal activities.   He swims regularly ( 3 times a week)    Dec. 10, 2014:  He has had some back pain  / sciatica.  No angina.   Able to do all of his normal activities without problems.   He is retired from [employer].    Oct. 13, 2015:  Mark [name] is doing well.  No CP,  On ASA alone  He was diagnosed with prostate cancer in March 2015.     He is off Testosterone supplement.   Now needs surgery.   He has elected to go with the [facility] robotic surgery.    He is splitting his metoprolol in half and taking twice a day.   He swims on a regular basis-perhaps 3 times a week. He swims for about 30-45 minutes and probably as much as one mild each day that he swims. He has no trouble climbing  1-2 flights of stairs. He is somewhat limited by his left ankle injury.  Had a stress Myoview study last year which showed:  IMPRESSION:  1. No scintigraphic evidence of prior infarction or  pharmacologically induced ischemia.  2. Normal wall motion. Ejection fraction - 71%.  Nov. 7, 2016:  Mark [name] is doing great. Swims 3 times a week .  No CP or dyspnea  Had robotic prostate surgery last year.  Is doing well with that .  Nov. 17, 2017:  Mark [name] is seen today for follow up of his CAD, HR is slow today   Tries to avoid salt Has some occasional episodes of orthostasis   Does not check his BP at home .   April 25, 2017: Doing well from a cardiac standpoint.   Has had some gastritis that seems to be related to his naproxyn .  No further GI bleeding . No CP or dyspnea Not getting regular exercise - needs  to get back to his swimming .  Has had some fatigue.  Maybe related to some anemia   Jan. 27, 2019:  Mark [name] is seen today for follow-up of his coronary artery disease. No CP , no dyspnea.   Exercises some,   Has slacked off his swimming now .   Has chronic ankle issues.   March 21, 2019:  Mark [name] is seen back today for follow-up of his coronary artery disease, hypertension, hyperlipidemia.  No cp or dyspnea.   Not much exercise.  Used to swim .  Has a hernia that will need to be repaired.   Will give clearence He is at low risk for his hernia repair.   He may hold his ASA for 5-7 days prior to surgery  Walks his dog regularly  Had labs at [place] from the [place] reveals a triglyceride level of 185.  The LDL is 36.  The HDL is 32.Marland Kitchen  Hemoglobin A1c is 5.5.  July 23, 2020: Mark [name] is seen today for follow up of his  CAD, HTN, HLD. Had some chest discomfort last year,  Went to the ER in [place].   Was thought to be anxiety Walks his dog  Has some ankle issues ,    Does not want to have surgery ,   Surgery would be difficult to heal up and would not absolutely solve his problems  bP has been elevated at home  Has lots of stress at home  BP is mildly elevated .  Is on Valsartan 80 mg a day and also on phenylephrine nasal spray as need  October 17, 2021 Mark [name] is seen today for follow up of his CAD, HTN, HLD Had a bladder surgery . Is doing well with that  No cp, no dyspnea Wt is 196 lbs ( down 22 lbs from last year )   Labs from Dr. Doreene Adas office from May night, 2023 reveals a total cholesterol of 95 HDL is 39 LDL is 40 Triglyceride level is 78. Hemoglobin is 15 Potassium is 4.9     Current Outpatient Medications on File Prior to Visit  Medication Sig Dispense Refill   acetaminophen (TYLENOL) 500 MG tablet Take 2 tablets by mouth every 6 hours as needed. 100 tablet 2   aspirin EC 81 MG tablet Take 81 mg by mouth every morning.     atorvastatin (LIPITOR) 40 MG  tablet Take 40 mg by mouth daily.     Calcium Carbonate-Vitamin D (CALCIUM 600+D PO) Take 1 tablet by mouth daily.      Cholecalciferol (VITAMIN D-3) 25 MCG (1000 UT) CAPS Take 1,000 Units by mouth daily.      diclofenac Sodium (VOLTAREN) 1 % GEL Apply 1 application topically 4 (four) times daily as needed (pain).     diphenhydrAMINE (BENADRYL) 25 MG tablet Take 25 mg by mouth every 6 (six) hours as needed for allergies.     docusate sodium (COLACE) 100 MG capsule Take 1 capsule by mouth daily as needed. 30 capsule 2   ferrous sulfate 325 (65 FE) MG tablet Take 325 mg by mouth every other day.     FIBER PO Take 1 capsule by mouth 2 (two) times daily.     gabapentin (NEURONTIN) 300 MG capsule Take 300 mg by mouth 2 (two) times daily.      Multiple Vitamins-Minerals (PRESERVISION AREDS 2 PO) Take 1 capsule by mouth 2 (two) times daily.     nitroGLYCERIN (NITROSTAT) 0.4 MG SL tablet Place 0.4 mg under the tongue every 5 (five) minutes as needed for chest pain.     Omega-3 Fatty Acids (FISH OIL) 1000 MG CAPS Take 1,000 mg by mouth 2 (two) times daily.     Phenylephrine HCl (SINEX ULTRA FINE MIST REGULAR NA) Place 1 spray into both nostrils daily as needed (congestion).     Polyvinyl Alcohol-Povidone (REFRESH OP) Place 1 drop into both eyes daily as needed (dry eyes).     potassium chloride (KLOR-CON) 10 MEQ tablet Take 10 mEq by mouth 2 (two) times daily.     valsartan (DIOVAN) 160 MG tablet Take 0.5 tablets (80 mg total) by mouth daily. 15 tablet 11   vitamin B-12 (CYANOCOBALAMIN) 500 MCG tablet Take 500 mcg by mouth daily.     No current facility-administered medications on file prior to visit.  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 and numbness rt lower leg / foot also has foot drop left foot wears brace   Prostate cancer ([place]) 06/17/13   gleason 3+4=7, vol 57.52 cc   Prostate cancer ([place])    Sciatica    Sleep apnea    no longer needs c- pap per sleep study per pt    Past Surgical History:  Procedure Laterality Date   ANGIOPLASTY  2002   stent placement   [place], 2002, 2005   lumbar fusion with hardware   COLONOSCOPY     [place]   left   [place] N/A 04/28/2019   Procedure: [place] with mesh;  Surgeon: Clovis Riley, MD;  [facility]: [facility];  Service: General;  Laterality: N/A;   LYMPHADENECTOMY Bilateral 02/05/2014   Procedure: Noel Journey;  Surgeon: Raynelle Bring, MD;  [facility];  Service: Urology;  Laterality: Bilateral;   POLYPECTOMY     PROSTATE BIOPSY  06/17/13   gleason 7   ROBOT ASSISTED LAPAROSCOPIC RADICAL PROSTATECTOMY N/A 02/05/2014   Procedure: ROBOTIC ASSISTED LAPAROSCOPIC RADICAL PROSTATECTOMY LEVEL 2;  Surgeon: Raynelle Bring, MD;  [facility];  Service: Urology;  Laterality: N/A;   URETHRAL SLING N/A 04/05/2021   Procedure: MALE SLING;  Surgeon: Bjorn Loser, MD;  [facility];  Service: Urology;  Laterality: N/A;  9 MINS FOR THIS CASE    Social History   Tobacco Use  Smoking Status Former   Packs/day: 0.50   Years: 20.00   Total pack years: 10.00   Types: Cigarettes   Quit date: 05/21/1978   Years since quitting: 43.4  Smokeless Tobacco Never    Social History   Substance and Sexual Activity  Alcohol Use Yes   Alcohol/week: 2.0 - 3.0 standard drinks of alcohol   Types: 1 Glasses of wine, 1 - 2 Cans of beer per week    Comment: occasional    Family History  Problem Relation Age of Onset   COPD Mother    Diabetes Mother    Hypertension Mother    Heart disease Mother    Breast cancer Mother    Thyroid disease Mother    Lung cancer Father    CAD Brother 59       early   43 cancer Brother    Hypertension Brother    Hypertension Sister    Diabetes Brother    Heart attack Brother 76   Colon cancer Neg Hx    Stomach cancer Neg Hx    Colon polyps Neg Hx    Esophageal cancer Neg Hx    Rectal cancer Neg Hx     Reviw of Systems:  Reviewed in the HPI.  All other systems are negative.  Physical Exam: Blood pressure 136/70, pulse (!) 58, height '5\' 9"'$  (1.753 m), weight 196 lb (88.9 kg), SpO2 97 %.  GEN:  Well nourished, well developed in no acute distress HEENT: Normal NECK: No JVD; No carotid bruits LYMPHATICS: No lymphadenopathy CARDIAC: RRR , no murmurs,  rubs, gallops RESPIRATORY:  Clear to auscultation without rales, wheezing or rhonchi  ABDOMEN: Soft, non-tender, non-distended MUSCULOSKELETAL:  No edema; No deformity  SKIN: Warm and dry NEUROLOGIC:  Alert and oriented x 3    ECG: October 17, 2021: Sinus bradycardia at 58.  Nonspecific ST and T wave changes.   Assessment / Plan:   1. CAD - PCI to LAD - 2002 Linard Millers)  -   denies having any episodes of angina.  Continue current medications.  2. Hypertension -     blood pressure is well controlled.  Continue current medications.  3. Hyperlipidemia -   lipids from Dr. Doreene Adas office look great .    [name], MD  10/17/2021 2:33 PM    [address],  [place], [place]  [address] Pager [phone] Phone: [phone]; Fax: [phone]

## 2021-10-17 ENCOUNTER — Ambulatory Visit: Payer: Medicare Other | Admitting: Cardiovascular Disease

## 2021-10-17 ENCOUNTER — Encounter: Payer: Self-pay | Admitting: Cardiovascular Disease

## 2021-10-17 VITALS — BP 136/70 | HR 58 | Ht 69.0 in | Wt 196.0 lb

## 2021-10-17 DIAGNOSIS — I2510 Atherosclerotic heart disease of native coronary artery without angina pectoris: Secondary | ICD-10-CM

## 2021-10-17 DIAGNOSIS — E782 Mixed hyperlipidemia: Secondary | ICD-10-CM | POA: Diagnosis not present

## 2021-10-17 NOTE — Patient Instructions (Signed)
Medication Instructions:  Your physician recommends that you continue on your current medications as directed. Please refer to the Current Medication list given to you today.  *If you need a refill on your cardiac medications before your next appointment, please call your pharmacy*   Lab Work: NONE If you have labs (blood work) drawn today and your tests are completely normal, you will receive your results only by: MyChart Message (if you have MyChart) OR A paper copy in the mail If you have any lab test that is abnormal or we need to change your treatment, we will call you to review the results.   Testing/Procedures: NONE   Follow-Up: At [facility], you and your health needs are our priority.  As part of our continuing mission to provide you with exceptional heart care, we have created designated Provider Care Teams.  These Care Teams include your primary Cardiologist (physician) and Advanced Practice Providers (APPs -  Physician Assistants and Nurse Practitioners) who all work together to provide you with the care you need, when you need it.  Your next appointment:   1 year(s)  The format for your next appointment:   In Person  Provider:   Swinyer, Weaver, or Nahser {     Important Information About Sugar       

## 2022-12-03 ENCOUNTER — Encounter: Payer: Self-pay | Admitting: Cardiovascular Disease

## 2022-12-03 NOTE — Progress Notes (Unsigned)
Mark [name] Date of Birth  08/14/1940       [facility]    [place] [address]    [place], [place]  [address]         Problem List: 1. CAD - PCI to LAD - 2002 Mark [name])  2. Hypertension 3. Hyperlipidemia 4.  Gastritis - mild GI bleeding     Mark [name] was recently admitted to the hospital with some CP.  Stress myoview was negative.   He is back doing his normal activities.   He swims regularly ( 3 times a week)    Dec. 10, 2014:  He has had some back pain  / sciatica.  No angina.   Able to do all of his normal activities without problems.   He is retired from [employer].    Oct. 13, 2015:  Mark [name] is doing well.  No CP,  On ASA alone  He was diagnosed with prostate cancer in March 2015.     He is off Testosterone supplement.   Now needs surgery.   He has elected to go with the [facility] robotic surgery.    He is splitting his metoprolol in half and taking twice a day.   He swims on a regular basis-perhaps 3 times a week. He swims for about 30-45 minutes and probably as much as one mild each day that he swims. He has no trouble climbing  1-2 flights of stairs. He is somewhat limited by his left ankle injury.  Had a stress Myoview study last year which showed:  IMPRESSION:  1. No scintigraphic evidence of prior infarction or  pharmacologically induced ischemia.  2. Normal wall motion. Ejection fraction - 71%.  Nov. 7, 2016:  Mark [name] is doing great. Swims 3 times a week .  No CP or dyspnea  Had robotic prostate surgery last year.  Is doing well with that .  Nov. 17, 2017:  Mark [name] is seen today for follow up of his CAD, HR is slow today   Tries to avoid salt Has some occasional episodes of orthostasis   Does not check his BP at home .   April 25, 2017: Doing well from a cardiac standpoint.   Has had some gastritis that seems to be related to his naproxyn .  No further GI bleeding . No CP or dyspnea Not getting regular exercise - needs  to get back to his swimming .  Has had some fatigue.  Maybe related to some anemia   Jan. 27, 2019:  Mark [name] is seen today for follow-up of his coronary artery disease. No CP , no dyspnea.   Exercises some,   Has slacked off his swimming now .   Has chronic ankle issues.   March 21, 2019:  Mark [name] is seen back today for follow-up of his coronary artery disease, hypertension, hyperlipidemia.  No cp or dyspnea.   Not much exercise.  Used to swim .  Has a hernia that will need to be repaired.   Will give clearence He is at low risk for his hernia repair.   He may hold his ASA for 5-7 days prior to surgery  Walks his dog regularly  Had [name] at [facility]  [name] from the [place] reveals a triglyceride level of 185.  The LDL is 36.  The HDL is 32.Mark [name]  Hemoglobin A1c is 5.5.  July 23, 2020: Mark [name] is seen today for follow up of his  CAD, HTN, HLD. Had some chest discomfort last year,  Went to the ER in [place].   Was thought to be anxiety Walks his dog  Has some ankle issues ,    Does not want to have surgery ,   Surgery would be difficult to heal up and would not absolutely solve his problems  bP has been elevated at home  Has lots of stress at home  BP is mildly elevated .  Is on Valsartan 80 mg a day and also on phenylephrine nasal spray as need  October 17, 2021 Mark [name] is seen today for follow up of his CAD, HTN, HLD Had a bladder surgery . Is doing well with that  No cp, no dyspnea Wt is 196 lbs ( down 22 lbs from last year )   [name] from Mark [name] office from May night, 2023 reveals a total cholesterol of 95 HDL is 39 LDL is 40 Triglyceride level is 78. Hemoglobin is 15 Potassium is 4.9   Aug. 26, 2024 Mark [name] is seen for follow up of his CAD, HTN, HLD Wt is      Current Outpatient Medications on File Prior to Visit  Medication Sig Dispense Refill   aspirin EC 81 MG tablet Take 81 mg by mouth every morning.     atorvastatin (LIPITOR) 40 MG tablet Take 40 mg by mouth  daily.     Calcium Carbonate-Vitamin D (CALCIUM 600+D PO) Take 1 tablet by mouth daily.      Cholecalciferol (VITAMIN D-3) 25 MCG (1000 UT) CAPS Take 1,000 Units by mouth daily.      diclofenac Sodium (VOLTAREN) 1 % GEL Apply 1 application topically 4 (four) times daily as needed (pain).     diphenhydrAMINE (BENADRYL) 25 MG tablet Take 25 mg by mouth every 6 (six) hours as needed for allergies.     ferrous sulfate 325 (65 FE) MG tablet Take 325 mg by mouth every other day.     FIBER PO Take 1 capsule by mouth 2 (two) times daily.     gabapentin (NEURONTIN) 300 MG capsule Take 300 mg by mouth 2 (two) times daily.      Multiple Vitamins-Minerals (PRESERVISION AREDS 2 PO) Take 1 capsule by mouth 2 (two) times daily.     nitroGLYCERIN (NITROSTAT) 0.4 MG SL tablet Place 0.4 mg under the tongue every 5 (five) minutes as needed for chest pain.     Omega-3 Fatty Acids (FISH OIL) 1000 MG CAPS Take 1,000 mg by mouth 2 (two) times daily.     Phenylephrine HCl (SINEX ULTRA FINE MIST REGULAR NA) Place 1 spray into both nostrils daily as needed (congestion).     Polyvinyl Alcohol-Povidone (REFRESH OP) Place 1 drop into both eyes daily as needed (dry eyes).     potassium chloride (KLOR-CON) 10 MEQ tablet Take 10 mEq by mouth 2 (two) times daily.     valsartan (DIOVAN) 160 MG tablet Take 0.5 tablets (80 mg total) by mouth daily. 15 tablet 11   vitamin B-12 (CYANOCOBALAMIN) 500 MCG tablet Take 500 mcg by mouth daily.     No current facility-administered medications on file prior to visit.  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 - has foot drop and numbness rt lower leg / foot also has foot drop left foot wears brace   Prostate cancer ([facility]) 06/17/13   gleason 3+4=7, vol 57.52 cc   Prostate cancer ([facility])    Sciatica    Sleep apnea    no longer needs c- pap per sleep study per pt    Past Surgical History:  Procedure Laterality Date   ANGIOPLASTY  2002   stent placement   BACK SURGERY  1998, 2002, 2005   lumbar fusion with hardware   COLONOSCOPY     ELBOW SURGERY  1998   left   INCISIONAL HERNIA REPAIR N/A 04/28/2019   Procedure: LAPAROSCOPIC INCISIONAL HERNIA with mesh;  Surgeon: Berna Bue, MD;  [facility];  Service: General;  Laterality: N/A;   LYMPHADENECTOMY Bilateral 02/05/2014   Procedure: [place];  Surgeon: Heloise Purpura, MD;  [facility];  Service: Urology;  Laterality: Bilateral;   POLYPECTOMY     PROSTATE BIOPSY  06/17/13   gleason 7   ROBOT ASSISTED LAPAROSCOPIC RADICAL PROSTATECTOMY N/A 02/05/2014   Procedure: ROBOTIC ASSISTED LAPAROSCOPIC RADICAL PROSTATECTOMY LEVEL 2;  Surgeon: Heloise Purpura, MD;  [facility];  Service: Urology;  Laterality: N/A;   URETHRAL SLING N/A 04/05/2021   Procedure: MALE SLING;  Surgeon: Alfredo Martinez, MD;  [facility];  Service: Urology;  Laterality: N/A;  90 MINS FOR THIS CASE    Social History   Tobacco Use  Smoking Status Former   Current packs/day: 0.00   Average packs/day: 0.5 packs/day for 20.0 years (10.0 ttl pk-yrs)   Types: Cigarettes   Start date: 05/21/1958   Quit date: 05/21/1978   Years since quitting: 44.5  Smokeless Tobacco Never    Social History   Substance and Sexual Activity  Alcohol Use Yes   Alcohol/week: 2.0 - 3.0 standard drinks of alcohol   Types: 1 Glasses of wine, 1 - 2 Cans of beer per week   Comment: occasional    Family History  Problem Relation Age of  Onset   COPD Mother    Diabetes Mother    Hypertension Mother    Heart disease Mother    Breast cancer Mother    Thyroid disease Mother    Lung cancer Father    CAD Brother 74       early   Lung cancer Brother    Hypertension Brother    Hypertension Sister    Diabetes Brother    Heart attack Brother 29   Colon cancer Neg Hx    Stomach cancer Neg Hx    Colon polyps Neg Hx    Esophageal cancer Neg Hx    Rectal cancer Neg Hx     Reviw of Systems:  Reviewed in the HPI.  All other systems are negative.  Physical Exam: There were no vitals taken for this visit.  No BP recorded.  {Refresh Note OR Click here to enter BP  :1}***    GEN:  Well nourished, well developed in no acute distress HEENT: Normal NECK: No JVD; No carotid bruits LYMPHATICS: No lymphadenopathy CARDIAC: RRR ***, no murmurs, rubs, gallops RESPIRATORY:  Clear  to auscultation without rales, wheezing or rhonchi  ABDOMEN: Soft, non-tender, non-distended MUSCULOSKELETAL:  No edema; No deformity  SKIN: Warm and dry NEUROLOGIC:  Alert and oriented x 3     ECG:        Assessment / Plan:   1. CAD - PCI to LAD - 2002 Mark [name])  -     2. Hypertension -        3. Hyperlipidemia -       Kristeen Miss, MD  12/03/2022 12:40 PM    [facility] [address] [place],  [address] [place], [place]  [address] Pager [phone] Phone: [phone]; Fax: [phone]

## 2022-12-04 ENCOUNTER — Ambulatory Visit: Payer: Medicare Other | Attending: Cardiovascular Disease | Admitting: Cardiovascular Disease

## 2022-12-04 ENCOUNTER — Encounter: Payer: Self-pay | Admitting: Cardiovascular Disease

## 2022-12-04 VITALS — BP 132/70 | HR 56 | Ht 69.0 in | Wt 215.0 lb

## 2022-12-04 DIAGNOSIS — I2510 Atherosclerotic heart disease of native coronary artery without angina pectoris: Secondary | ICD-10-CM | POA: Diagnosis not present

## 2022-12-04 DIAGNOSIS — R011 Cardiac murmur, unspecified: Secondary | ICD-10-CM | POA: Insufficient documentation

## 2022-12-04 NOTE — Patient Instructions (Signed)
Medication Instructions:   Your physician recommends that you continue on your current medications as directed. Please refer to the Current Medication list given to you today.  *If you need a refill on your cardiac medications before your next appointment, please call your pharmacy*   Testing/Procedures:  Your physician has requested that you have an echocardiogram. Echocardiography is a painless test that uses sound waves to create images of your heart. It provides your doctor with information about the size and shape of your heart and how well your heart's chambers and valves are working. This procedure takes approximately one hour. There are no restrictions for this procedure. Please do NOT wear cologne, perfume, aftershave, or lotions (deodorant is allowed). Please arrive 15 minutes prior to your appointment time.    Follow-Up: At [facility], you and your health needs are our priority.  As part of our continuing mission to provide you with exceptional heart care, we have created designated Provider Care Teams.  These Care Teams include your primary Cardiologist (physician) and Advanced Practice Providers (APPs -  Physician Assistants and Nurse Practitioners) who all work together to provide you with the care you need, when you need it.  We recommend signing up for the patient portal called "MyChart".  Sign up information is provided on this After Visit Summary.  MyChart is used to connect with patients for Virtual Visits (Telemedicine).  Patients are able to view lab/test results, encounter notes, upcoming appointments, etc.  Non-urgent messages can be sent to your provider as well.   To learn more about what you can do with MyChart, go to [email or web address].    Your next appointment:   1 year(s)  Provider:   Kristeen Miss, MD

## 2022-12-25 ENCOUNTER — Ambulatory Visit (HOSPITAL_COMMUNITY): Payer: Medicare Other | Attending: Cardiology

## 2022-12-25 DIAGNOSIS — I2510 Atherosclerotic heart disease of native coronary artery without angina pectoris: Secondary | ICD-10-CM | POA: Diagnosis present

## 2022-12-25 DIAGNOSIS — R011 Cardiac murmur, unspecified: Secondary | ICD-10-CM | POA: Insufficient documentation

## 2022-12-25 LAB — ECHOCARDIOGRAM COMPLETE
Area-P 1/2: 3.03 cm2
P 1/2 time: 652 ms
S' Lateral: 2.8 cm

## 2023-01-29 ENCOUNTER — Emergency Department (HOSPITAL_COMMUNITY): Payer: Medicare Other

## 2023-01-29 ENCOUNTER — Encounter (HOSPITAL_COMMUNITY): Payer: Self-pay

## 2023-01-29 ENCOUNTER — Other Ambulatory Visit: Payer: Self-pay

## 2023-01-29 ENCOUNTER — Emergency Department (HOSPITAL_COMMUNITY)
Admission: EM | Admit: 2023-01-29 | Discharge: 2023-01-29 | Disposition: A | Discharge status: Home / Self Care | Payer: Medicare Other | Attending: Emergency Medicine | Admitting: Emergency Medicine

## 2023-01-29 DIAGNOSIS — Z7982 Long term (current) use of aspirin: Secondary | ICD-10-CM | POA: Diagnosis not present

## 2023-01-29 DIAGNOSIS — R7989 Other specified abnormal findings of blood chemistry: Secondary | ICD-10-CM | POA: Insufficient documentation

## 2023-01-29 DIAGNOSIS — Z8546 Personal history of malignant neoplasm of prostate: Secondary | ICD-10-CM | POA: Diagnosis not present

## 2023-01-29 DIAGNOSIS — I2510 Atherosclerotic heart disease of native coronary artery without angina pectoris: Secondary | ICD-10-CM | POA: Insufficient documentation

## 2023-01-29 DIAGNOSIS — R079 Chest pain, unspecified: Secondary | ICD-10-CM | POA: Diagnosis present

## 2023-01-29 DIAGNOSIS — I10 Essential (primary) hypertension: Secondary | ICD-10-CM | POA: Insufficient documentation

## 2023-01-29 DIAGNOSIS — R600 Localized edema: Secondary | ICD-10-CM | POA: Insufficient documentation

## 2023-01-29 DIAGNOSIS — Z79899 Other long term (current) drug therapy: Secondary | ICD-10-CM | POA: Diagnosis not present

## 2023-01-29 LAB — BASIC METABOLIC PANEL
Anion gap: 10 (ref 5–15)
BUN: 14 mg/dL (ref 8–23)
CO2: 26 mmol/L (ref 22–32)
Calcium: 8.6 mg/dL — ABNORMAL LOW (ref 8.9–10.3)
Chloride: 101 mmol/L (ref 98–111)
Creatinine, Ser: 1 mg/dL (ref 0.61–1.24)
GFR, Estimated: >60 mL/min (ref >60)
Glucose, Bld: 101 mg/dL — ABNORMAL HIGH (ref 70–99)
Potassium: 3.4 mmol/L — ABNORMAL LOW (ref 3.5–5.1)
Sodium: 137 mmol/L (ref 135–145)

## 2023-01-29 LAB — CBC
HCT: 42.3 % (ref 39.0–52.0)
Hemoglobin: 14.7 g/dL (ref 13.0–17.0)
MCH: 31.5 pg (ref 26.0–34.0)
MCHC: 34.8 g/dL (ref 30.0–36.0)
MCV: 90.6 fL (ref 80.0–100.0)
Platelets: 196 10*3/uL (ref 150–400)
RBC: 4.67 MIL/uL (ref 4.22–5.81)
RDW: 12.7 % (ref 11.5–15.5)
WBC: 6.8 10*3/uL (ref 4.0–10.5)
nRBC: 0 % (ref 0.0–0.2)

## 2023-01-29 LAB — TROPONIN I (HIGH SENSITIVITY)
Troponin I (High Sensitivity): 23 ng/L — ABNORMAL HIGH (ref <18)
Troponin I (High Sensitivity): 23 ng/L — ABNORMAL HIGH (ref <18)

## 2023-01-29 LAB — BRAIN NATRIURETIC PEPTIDE: B Natriuretic Peptide: 16.7 pg/mL (ref 0.0–100.0)

## 2023-01-29 MED ORDER — SODIUM BICARBONATE 8.4 % IV SOLN
50.0000 meq | Freq: Once | INTRAVENOUS | Status: AC
  Administered 2023-01-29: 50 meq via INTRAVENOUS
  Filled 2023-01-29: qty 50

## 2023-01-29 MED ORDER — NITROGLYCERIN 0.4 MG SL SUBL
0.4000 mg | SUBLINGUAL_TABLET | Freq: Once | SUBLINGUAL | Status: AC
  Administered 2023-01-29: 0.4 mg via SUBLINGUAL
  Filled 2023-01-29: qty 1

## 2023-01-29 NOTE — ED Provider Triage Note (Signed)
Emergency Medicine Provider Triage Evaluation Note  Mark [name] , a 82 y.o. male  was evaluated in triage.  Pt complains of Chest pain around 4:00 am that woke him sleep, constant since. Took NTG x2 at 6:00 without significant relief. No SOB. Stent in Sept 2002. No cough or fever. Better when active.   Review of Systems  Positive: Chest pain Negative: Vomiting, SOB  Physical Exam  BP 118/69 (BP Location: Right Arm)   Pulse (!) 55   Temp 97.8 F (36.6 C)   Resp 16   Ht 5\' 9"  (1.753 m)   Wt 97.5 kg   SpO2 97%   BMI 31.74 kg/m  Gen:   Awake, no distress   Resp:  Normal effort  MSK:   Moves extremities without difficulty  Other:  RRR, 1+ pitting edema bilaterally ("normal")  Medical Decision Making  Medically screening exam initiated at 2:31 PM.  Appropriate orders placed.  FARAH SCHIRTZINGER was informed that the remainder of the evaluation will be completed by another provider, this initial triage assessment does not replace that evaluation, and the importance of remaining in the ED until their evaluation is complete.  EKG, labs, CXR pending   Elpidio Anis, PA-C 01/29/23 1434

## 2023-01-29 NOTE — ED Triage Notes (Signed)
Pt c/o right sided chest pain started at 0400 today. PT denies N/V, SOB

## 2023-01-29 NOTE — ED Provider Notes (Signed)
[place] EMERGENCY DEPARTMENT AT [facility] Provider Note   CSN: [id number] Arrival date & time: 01/29/23  1359     History  Chief Complaint  Patient presents with   Chest Pain    Mark [name] is a 82 y.o. male.   Chest Pain Patient presents with chest pain.  Anterior chest.  Anterior to right side.  Began around 4 in the morning.  Nitroglycerin this morning without relief.  States it does feel like previous angina that he had had a few months ago.  That was isolated and had echocardiogram that was reassuring at that time.  Did have previously remote cardiac stent in 2007.  Had negative stress test in 2014.    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 Authorizing Provider  aspirin EC 81 MG tablet Take 81 mg by mouth every morning.    [provider]  atorvastatin (LIPITOR) 40 MG tablet Take 40 mg by mouth daily.    [provider]  Calcium Carbonate-Vitamin D (CALCIUM 600+D PO) Take 1 tablet by mouth daily.     [provider]  Cholecalciferol (VITAMIN D-3) 25 MCG (1000 UT) CAPS Take 1,000 Units by mouth daily.     [provider]  diclofenac Sodium (VOLTAREN) 1 % GEL Apply 1 application topically 4 (four) times daily as needed  (pain).    [provider]  diphenhydrAMINE (BENADRYL) 25 MG tablet Take 25 mg by mouth every 6 (six) hours as needed for allergies.    [provider]  ferrous sulfate 325 (65 FE) MG tablet Take 325 mg by mouth every other day.    [provider]  FIBER PO Take 1 capsule by mouth 2 (two) times daily.    [provider]  gabapentin (NEURONTIN) 300 MG capsule Take 300 mg by mouth 2 (two) times daily.     [provider]  Multiple Vitamins-Minerals (PRESERVISION AREDS 2 PO) Take 1 capsule by mouth 2 (two) times daily.    [provider]  nitroGLYCERIN (NITROSTAT) 0.4 MG SL tablet Place 0.4 mg under the tongue every 5 (five) minutes as needed for chest pain.    [provider]  Omega-3 Fatty Acids (FISH OIL) 1000 MG CAPS Take 1,000 mg by mouth 2 (two) times daily.    [provider]  Phenylephrine HCl (SINEX ULTRA FINE MIST REGULAR NA) Place 1 spray into both nostrils daily as needed (congestion).    [provider]  Polyvinyl Alcohol-Povidone (REFRESH OP) Place 1 drop into both eyes daily as needed (dry eyes).  [provider]  potassium chloride (KLOR-CON) 10 MEQ tablet Take 10 mEq by mouth 2 (two) times daily.    [provider]  solifenacin (VESICARE) 5 MG tablet Take 5 mg by mouth daily. 11/08/22   [provider]  valsartan (DIOVAN) 160 MG tablet Take 0.5 tablets (80 mg total) by mouth daily. 05/29/19   Nahser, Deloris Ping, MD  vitamin B-12 (CYANOCOBALAMIN) 500 MCG tablet Take 500 mcg by mouth daily.    [provider]      Allergies    Codeine, Lisinopril, and Percocet [oxycodone-acetaminophen]    Review of Systems   Review of Systems  Cardiovascular:  Positive for chest pain.    Physical Exam Updated Vital Signs BP 131/67   Pulse (!) 49   Temp (!) 97.5 F (36.4 C) (Oral)   Resp (!) 21   Ht 5\' 9"  (1.753 m)   Wt 97.5 kg   SpO2 98%   BMI 31.74 kg/m  Physical  Exam Vitals and nursing note reviewed.  Cardiovascular:     Rate and Rhythm: Normal rate and regular rhythm.  Pulmonary:     Breath sounds: No decreased breath sounds or wheezing.  Chest:     Chest wall: No tenderness.  Abdominal:     Tenderness: There is no abdominal tenderness.  Musculoskeletal:     Right lower leg: Edema present.     Left lower leg: Edema present.  Skin:    Capillary Refill: Capillary refill takes less than 2 seconds.  Neurological:     Mental Status: He is alert.     ED Results / Procedures / Treatments   Labs (all labs ordered are listed, but only abnormal results are displayed) Labs Reviewed  BASIC METABOLIC PANEL - Abnormal; Notable for the following components:      Result Value   Potassium 3.4 (*)    Glucose, Bld 101 (*)    Calcium 8.6 (*)    All other components within normal limits  TROPONIN I (HIGH SENSITIVITY) - Abnormal; Notable for the following components:   Troponin I (High Sensitivity) 23 (*)    All other components within normal limits  TROPONIN I (HIGH SENSITIVITY) - Abnormal; Notable for the following components:   Troponin I (High Sensitivity) 23 (*)    All other components within normal limits  CBC  BRAIN NATRIURETIC PEPTIDE    EKG EKG Interpretation Date/Time:  Monday January 29 2023 14:14:42 EDT Ventricular Rate:  52 PR Interval:  206 QRS Duration:  78 QT Interval:  432 QTC Calculation: 401 R Axis:   42  Text Interpretation: Sinus bradycardia Otherwise normal ECG No significant change since last tracing Confirmed by Benjiman Core [phone]) on 01/29/2023 5:38:14 PM  Radiology DG Chest 2 View  Result Date: 01/29/2023 CLINICAL DATA:  Right-sided chest pain x1 day. EXAM: CHEST - 2 VIEW COMPARISON:  January 30, 2014 FINDINGS: The heart size and mediastinal contours are within normal limits. Both lungs are clear. Multilevel degenerative changes seen throughout the thoracic spine. IMPRESSION: No active cardiopulmonary  disease. Electronically Signed   By: Aram Candela M.D.   On: 01/29/2023 17:41    Procedures Procedures    Medications Ordered in ED Medications  nitroGLYCERIN (NITROSTAT) SL tablet 0.4 mg (0.4 mg Sublingual Given 01/29/23 1757)  sodium bicarbonate injection 50 mEq (50 mEq Intravenous Given 01/29/23 1806)    ED Course/ Medical Decision Making/ A&P  Medical Decision Making Amount and/or Complexity of Data Reviewed Labs: ordered. Radiology: ordered.  Risk Prescription drug management.   Patient with chest pain.  Anterior chest.  EKG reassuring.  Does have cardiac history.  Differential diagnose includes cardiac causes, noncardiac causes such as GERD.  However initial troponin mildly elevated at 23.  Repeat also 23.  No relief with nitro.  However with previous stent and continued pain will discuss with cardiology.  Second troponin also at 23.  Continued pain.  Without change it is reassuring.  Discussed with Dr. Gaynelle Arabian from cardiology.  He reviewed EKG and history.  With stable troponins he thinks is reasonable for short-term follow-up and states he will be calling to help get this arranged.  Also cardiology follow-up consult put in.  Appears stable for discharge home.  Patient also reportedly had had some stress in his life.  Asking for resources.         Final Clinical Impression(s) / ED Diagnoses Final diagnoses:  Nonspecific chest pain    Rx / DC Orders ED Discharge Orders          Ordered    Ambulatory referral to Cardiology       Comments: If you have not heard from the Cardiology office within the next 72 hours please call [phone].   01/29/23 Loreli Slot, MD 01/29/23 [phone]

## 2023-02-22 ENCOUNTER — Encounter: Payer: Self-pay | Admitting: Nurse Practitioner

## 2023-02-22 ENCOUNTER — Ambulatory Visit: Payer: Medicare Other | Attending: Nurse Practitioner | Admitting: Nurse Practitioner

## 2023-02-22 VITALS — BP 138/80 | HR 71 | Ht 69.0 in | Wt 217.0 lb

## 2023-02-22 DIAGNOSIS — I351 Nonrheumatic aortic (valve) insufficiency: Secondary | ICD-10-CM | POA: Diagnosis not present

## 2023-02-22 DIAGNOSIS — M7989 Other specified soft tissue disorders: Secondary | ICD-10-CM | POA: Diagnosis not present

## 2023-02-22 DIAGNOSIS — I10 Essential (primary) hypertension: Secondary | ICD-10-CM

## 2023-02-22 DIAGNOSIS — I2510 Atherosclerotic heart disease of native coronary artery without angina pectoris: Secondary | ICD-10-CM

## 2023-02-22 DIAGNOSIS — E785 Hyperlipidemia, unspecified: Secondary | ICD-10-CM

## 2023-02-22 NOTE — Patient Instructions (Signed)
Medication Instructions:   Your physician recommends that you continue on your current medications as directed. Please refer to the Current Medication list given to you today.   *If you need a refill on your cardiac medications before your next appointment, please call your pharmacy*   Lab Work:  None ordered.  If you have labs (blood work) drawn today and your tests are completely normal, you will receive your results only by: MyChart Message (if you have MyChart) OR A paper copy in the mail If you have any lab test that is abnormal or we need to change your treatment, we will call you to review the results.   Testing/Procedures:  None ordered.   Follow-Up: At [facility], you and your health needs are our priority.  As part of our continuing mission to provide you with exceptional heart care, we have created designated Provider Care Teams.  These Care Teams include your primary Cardiologist (physician) and Advanced Practice Providers (APPs -  Physician Assistants and Nurse Practitioners) who all work together to provide you with the care you need, when you need it.  We recommend signing up for the patient portal called "MyChart".  Sign up information is provided on this After Visit Summary.  MyChart is used to connect with patients for Virtual Visits (Telemedicine).  Patients are able to view lab/test results, encounter notes, upcoming appointments, etc.  Non-urgent messages can be sent to your provider as well.   To learn more about what you can do with MyChart, go to [email or web address].    Your next appointment:   1 year(s)  Provider:   Eligha Bridegroom, NP         Other Instructions  Your physician wants you to follow-up in: 1 year.  You will receive a reminder letter in the mail two months in advance. If you don't receive a letter, please call our office to schedule the follow-up appointment.  Adopting a Healthy Lifestyle.   Weight: Know what a healthy  weight is for you (roughly BMI <25) and aim to maintain this. You can calculate your body mass index on your smart phone. Unfortunately, this is not the most accurate measure of healthy weight, but it is the simplest measurement to use. A more accurate measurement involves body scanning which measures lean muscle, fat tissue and bony density. We do not have this equipment at [facility].    Diet: Aim for 7+ servings of fruits and vegetables daily Limit animal fats in diet for cholesterol and heart health - choose grass fed whenever available Avoid highly processed foods (fast food burgers, tacos, fried chicken, pizza, hot dogs, french fries)  Saturated fat comes in the form of butter, lard, coconut oil, margarine, partially hydrogenated oils, and fat in meat. These increase your risk of cardiovascular disease.  Use healthy plant oils, such as olive, canola, soy, corn, sunflower and peanut.  Whole foods such as fruits, vegetables and whole grains have fiber  Men need > 38 grams of fiber per day Women need > 25 grams of fiber per day  Load up on vegetables and fruits - one-half of your plate: Aim for color and variety, and remember that potatoes dont count. Go for whole grains - one-quarter of your plate: Whole wheat, barley, wheat berries, quinoa, oats, brown rice, and foods made with them. If you want pasta, go with whole wheat pasta. Protein power - one-quarter of your plate: Fish, chicken, beans, and nuts are all healthy, versatile protein sources. Limit red  meat. You need carbohydrates for energy! The type of carbohydrate is more important than the amount. Choose carbohydrates such as vegetables, fruits, whole grains, beans, and nuts in the place of white rice, white pasta, potatoes (baked or fried), macaroni and cheese, cakes, cookies, and donuts.  If youre thirsty, drink water. Coffee and tea are good in moderation, but skip sugary drinks and limit milk and dairy products to one or two daily  servings. Keep sugar intake at 6 teaspoons or 24 grams or LESS       Exercise: Aim for 150 min of moderate intensity exercise weekly for heart health, and weights twice weekly for bone health Stay active - any steps are better than no steps! Aim for 7-9 hours of sleep daily          Mediterranean Diet  Why follow it? Research shows. Those who follow the Mediterranean diet have a reduced risk of heart disease  The diet is associated with a reduced incidence of Parkinson's and Alzheimer's diseases People following the diet may have longer life expectancies and lower rates of chronic diseases  The Dietary Guidelines for Americans recommends the Mediterranean diet as an eating plan to promote health and prevent disease  What Is the Mediterranean Diet?  Healthy eating plan based on typical foods and recipes of Mediterranean-style cooking The diet is primarily a plant based diet; these foods should make up a majority of meals   Starches - Plant based foods should make up a majority of meals - They are an important sources of vitamins, minerals, energy, antioxidants, and fiber - Choose whole grains, foods high in fiber and minimally processed items  - Typical grain sources include wheat, oats, barley, corn, brown rice, bulgar, farro, millet, polenta, couscous  - Various types of beans include chickpeas, lentils, fava beans, black beans, white beans   Fruits  Veggies - Large quantities of antioxidant rich fruits & veggies; 6 or more servings  - Vegetables can be eaten raw or lightly drizzled with oil and cooked  - Vegetables common to the traditional Mediterranean Diet include: artichokes, arugula, beets, broccoli, brussel sprouts, cabbage, carrots, celery, collard greens, cucumbers, eggplant, kale, leeks, lemons, lettuce, mushrooms, okra, onions, peas, peppers, potatoes, pumpkin, radishes, rutabaga, shallots, spinach, sweet potatoes, turnips, zucchini - Fruits common to the Mediterranean Diet  include: apples, apricots, avocados, cherries, clementines, dates, figs, grapefruits, grapes, melons, nectarines, oranges, peaches, pears, pomegranates, strawberries, tangerines  Fats - Replace butter and margarine with healthy oils, such as olive oil, canola oil, and tahini  - Limit nuts to no more than a handful a day  - Nuts include walnuts, almonds, pecans, pistachios, pine nuts  - Limit or avoid candied, honey roasted or heavily salted nuts - Olives are central to the [employer] - can be eaten whole or used in a variety of dishes   Meats Protein - Limiting red meat: no more than a few times a month - When eating red meat: choose lean cuts and keep the portion to the size of deck of cards - Eggs: approx. 0 to 4 times a week  - Fish and lean poultry: at least 2 a week  - Healthy protein sources include, chicken, [place], lean beef, lamb - Increase intake of seafood such as tuna, salmon, trout, mackerel, shrimp, scallops - Avoid or limit high fat processed meats such as sausage and bacon  Dairy - Include moderate amounts of low fat dairy products  - Focus on healthy dairy such as fat  free yogurt, skim milk, low or reduced fat cheese - Limit dairy products higher in fat such as whole or 2% milk, cheese, ice cream  Alcohol - Moderate amounts of red wine is ok  - No more than 5 oz daily for women (all ages) and men older than age 2  - No more than 10 oz of wine daily for men younger than 48  Other - Limit sweets and other desserts  - Use herbs and spices instead of salt to flavor foods  - Herbs and spices common to the traditional Mediterranean Diet include: basil, bay leaves, chives, cloves, cumin, fennel, garlic, lavender, marjoram, mint, oregano, parsley, pepper, rosemary, sage, savory, sumac, tarragon, thyme   It's not just a diet, it's a lifestyle:  The Mediterranean diet includes lifestyle factors typical of those in the region  Foods, drinks and meals are best eaten with others  and savored Daily physical activity is important for overall good health This could be strenuous exercise like running and aerobics This could also be more leisurely activities such as walking, housework, yard-work, or taking the stairs Moderation is the key; a balanced and healthy diet accommodates most foods and drinks Consider portion sizes and frequency of consumption of certain foods   Meal Ideas & Options:  Breakfast:  Whole wheat toast or whole wheat English muffins with peanut butter & hard boiled egg Steel cut oats topped with apples & cinnamon and skim milk  Fresh fruit: banana, strawberries, melon, berries, peaches  Smoothies: strawberries, bananas, greek yogurt, peanut butter Low fat greek yogurt with blueberries and granola  Egg white omelet with spinach and mushrooms Breakfast couscous: whole wheat couscous, apricots, skim milk, cranberries  Sandwiches:  Hummus and grilled vegetables (peppers, zucchini, squash) on whole wheat bread   Grilled chicken on whole wheat pita with lettuce, tomatoes, cucumbers or tzatziki  [place] salad on whole wheat bread: tuna salad made with greek yogurt, olives, red peppers, capers, green onions Garlic rosemary lamb pita: lamb sauted with garlic, rosemary, salt & pepper; add lettuce, cucumber, greek yogurt to pita - flavor with lemon juice and black pepper  Seafood:  Mediterranean grilled salmon, seasoned with garlic, basil, parsley, lemon juice and black pepper Shrimp, lemon, and spinach whole-grain pasta salad made with low fat greek yogurt  Seared scallops with lemon orzo  Seared tuna steaks seasoned salt, pepper, coriander topped with tomato mixture of olives, tomatoes, olive oil, minced garlic, parsley, green onions and cappers  Meats:  Herbed greek chicken salad with kalamata olives, cucumber, feta  Red bell peppers stuffed with spinach, bulgur, lean ground beef (or lentils) & topped with feta   Kebabs: skewers of chicken, tomatoes,  onions, zucchini, squash  [place] burgers: made with red onions, mint, dill, lemon juice, feta cheese topped with roasted red peppers Vegetarian Cucumber salad: cucumbers, artichoke hearts, celery, red onion, feta cheese, tossed in olive oil & lemon juice  Hummus and whole grain pita points with a greek salad (lettuce, tomato, feta, olives, cucumbers, red onion) Lentil soup with celery, carrots made with vegetable broth, garlic, salt and pepper  Tabouli salad: parsley, bulgur, mint, scallions, cucumbers, tomato, radishes, lemon juice, olive oil, salt and pepper.

## 2023-02-22 NOTE — Progress Notes (Signed)
Cardiology Office Note:  .   Date:  02/22/2023  ID:  Mark [name], DOB 1941/03/28, MRN [id number] PCP: Noberto Retort, MD  [facility] Providers Cardiologist:  Kristeen Miss, MD    Patient Profile: .      PMH Coronary artery disease Stent placement 2002 Dyslipidemia Prostate cancer Mild aortic insufficiency  ED visit 01/29/23 with chest pain.  Mildly elevated troponin at 23 >> 23, EKG reassuring.  He did not have any relief with nitroglycerin.  Plan was discussed with Dr. Gaynelle Arabian and outpatient follow-up was recommended.       History of Present Illness: .   Mark [name] is a very pleasant 82 y.o. male who presents for follow-up after a recent emergency room visit for chest pain. He reports chest pain resolved shortly after arriving at the emergency room and has not recurred. He attributes the chest pain to anxiety due to multiple stressors, including his wife's recent surgery and his granddaughter's labor induction. He also continues to grieve the death of his son. He denies any exertional chest pain or shortness of breath and continues to swim regularly for exercise. He reports some swelling around the ankles but denies any other signs of fluid overload. He has foot drop and nerve damage in the legs. He expresses a desire to improve his diet and lose weight. [facility] because it was somewhat elevated and has been improved with increases in hydrochlorothiazide and losartan.   Discussed the use of AI scribe software for clinical note transcription with the patient, who gave verbal consent to proceed.   ROS: See HPI       Studies Reviewed: .        Risk Assessment/Calculations:             Physical Exam:   VS:  BP 138/80 (BP Location: Left Arm, Patient Position: Sitting, Cuff Size: Large)   Pulse 71   Ht 5\' 9"  (1.753 m)   Wt 217 lb (98.4 kg)   SpO2 96%   BMI 32.05 kg/m    Wt Readings from Last 3 Encounters:  02/22/23 217 lb  (98.4 kg)  01/29/23 214 lb 15.2 oz (97.5 kg)  12/04/22 215 lb (97.5 kg)    GEN: Well nourished, well developed in no acute distress NECK: No JVD; No carotid bruits CARDIAC: RRR, Systolic murmurs LUSB. No rubs, gallops RESPIRATORY:  Clear to auscultation without rales, wheezing or rhonchi  ABDOMEN: Soft, non-tender, non-distended EXTREMITIES:  Mild bilateral LE edema; No deformity     ASSESSMENT AND PLAN: .    CAD without angina: Remote stenting to LAD.  Recent episode of chest pain that led him to the ED with mildly elevated troponin but flat.  EKG was unremarkable.  He has had no further occurrences. He continues to exercise with no concerning cardiac symptoms with exertion.  We discussed testing for ischemia evaluation but he would like to hold off for now.  He will let us know if he has worsening symptoms.  No bleeding concerns.  Cholesterol has been well-controlled.  Continue Hyzaar, fish oil, atorvastatin, and aspirin.  Hypertension: Recent adjustment in anti-hypertensive therapy. BP is well controlled today. [facility] per [facility].   Leg swelling: Mild bilateral LE edema.  He has grade 1 diastolic dysfunction on echo 12/25/22. He denies dyspnea, orthopnea, PND.  He appears euvolemic on exam.  We discussed symptoms of diastolic heart failure. Continue hydrochlorothiazide.   Hyperlipidemia LDL goal < 70: Last  cholesterol check in May 2024 showed LDL of 47. Continue atorvastatin.  He requested information on healthy diet which was provided.  Aim for 150 minutes of moderate intensity exercise each week along with heart healthy, mostly plant-based diet avoiding processed foods, simple carbohydrates, sugar, and saturated fat.  Aortic insufficiency: Calcification of the aortic valve without stenosis.  Mild aortic insufficiency with normal LVEF, G1 DD.  Echo results reviewed in detail and questions answered to his satisfaction.  He is asymptomatic.  We will continue to monitor clinically for now.        Dispo: 1 year with me  Signed, Eligha Bridegroom, NP-C

## 2023-05-16 DIAGNOSIS — I2510 Atherosclerotic heart disease of native coronary artery without angina pectoris: Secondary | ICD-10-CM | POA: Diagnosis not present

## 2023-05-16 DIAGNOSIS — Z8546 Personal history of malignant neoplasm of prostate: Secondary | ICD-10-CM | POA: Diagnosis not present

## 2023-05-16 DIAGNOSIS — E669 Obesity, unspecified: Secondary | ICD-10-CM | POA: Diagnosis not present

## 2023-05-16 DIAGNOSIS — Z6831 Body mass index (BMI) 31.0-31.9, adult: Secondary | ICD-10-CM | POA: Diagnosis not present

## 2023-05-16 DIAGNOSIS — I10 Essential (primary) hypertension: Secondary | ICD-10-CM | POA: Diagnosis not present

## 2023-05-16 DIAGNOSIS — F17211 Nicotine dependence, cigarettes, in remission: Secondary | ICD-10-CM | POA: Diagnosis not present

## 2023-05-16 DIAGNOSIS — Z008 Encounter for other general examination: Secondary | ICD-10-CM | POA: Diagnosis not present

## 2023-05-16 DIAGNOSIS — E785 Hyperlipidemia, unspecified: Secondary | ICD-10-CM | POA: Diagnosis not present

## 2023-08-31 DIAGNOSIS — E7800 Pure hypercholesterolemia, unspecified: Secondary | ICD-10-CM | POA: Diagnosis not present

## 2023-08-31 DIAGNOSIS — I25119 Atherosclerotic heart disease of native coronary artery with unspecified angina pectoris: Secondary | ICD-10-CM | POA: Diagnosis not present

## 2023-08-31 DIAGNOSIS — Z0000 Encounter for general adult medical examination without abnormal findings: Secondary | ICD-10-CM | POA: Diagnosis not present

## 2023-08-31 DIAGNOSIS — M792 Neuralgia and neuritis, unspecified: Secondary | ICD-10-CM | POA: Diagnosis not present

## 2023-08-31 DIAGNOSIS — Z8546 Personal history of malignant neoplasm of prostate: Secondary | ICD-10-CM | POA: Diagnosis not present

## 2023-08-31 DIAGNOSIS — R251 Tremor, unspecified: Secondary | ICD-10-CM | POA: Diagnosis not present

## 2023-08-31 DIAGNOSIS — I10 Essential (primary) hypertension: Secondary | ICD-10-CM | POA: Diagnosis not present

## 2023-09-17 DIAGNOSIS — Z85828 Personal history of other malignant neoplasm of skin: Secondary | ICD-10-CM | POA: Diagnosis not present

## 2023-09-17 DIAGNOSIS — D485 Neoplasm of uncertain behavior of skin: Secondary | ICD-10-CM | POA: Diagnosis not present

## 2023-09-17 DIAGNOSIS — I788 Other diseases of capillaries: Secondary | ICD-10-CM | POA: Diagnosis not present

## 2023-09-17 DIAGNOSIS — L578 Other skin changes due to chronic exposure to nonionizing radiation: Secondary | ICD-10-CM | POA: Diagnosis not present

## 2023-09-17 DIAGNOSIS — L570 Actinic keratosis: Secondary | ICD-10-CM | POA: Diagnosis not present

## 2024-03-28 ENCOUNTER — Emergency Department (HOSPITAL_BASED_OUTPATIENT_CLINIC_OR_DEPARTMENT_OTHER)

## 2024-03-28 ENCOUNTER — Encounter (HOSPITAL_BASED_OUTPATIENT_CLINIC_OR_DEPARTMENT_OTHER): Payer: Self-pay | Admitting: Emergency Medicine

## 2024-03-28 ENCOUNTER — Other Ambulatory Visit: Payer: Self-pay

## 2024-03-28 ENCOUNTER — Emergency Department (HOSPITAL_BASED_OUTPATIENT_CLINIC_OR_DEPARTMENT_OTHER)
Admission: EM | Admit: 2024-03-28 | Discharge: 2024-03-28 | Disposition: A | Discharge status: Home / Self Care | Attending: Emergency Medicine | Admitting: Emergency Medicine

## 2024-03-28 DIAGNOSIS — I2510 Atherosclerotic heart disease of native coronary artery without angina pectoris: Secondary | ICD-10-CM | POA: Insufficient documentation

## 2024-03-28 DIAGNOSIS — I10 Essential (primary) hypertension: Secondary | ICD-10-CM | POA: Diagnosis not present

## 2024-03-28 DIAGNOSIS — R059 Cough, unspecified: Secondary | ICD-10-CM | POA: Diagnosis present

## 2024-03-28 DIAGNOSIS — J101 Influenza due to other identified influenza virus with other respiratory manifestations: Secondary | ICD-10-CM | POA: Insufficient documentation

## 2024-03-28 DIAGNOSIS — Z7982 Long term (current) use of aspirin: Secondary | ICD-10-CM | POA: Insufficient documentation

## 2024-03-28 LAB — RESP PANEL BY RT-PCR (RSV, FLU A&B, COVID)  RVPGX2
Influenza A by PCR: POSITIVE — AB
Influenza B by PCR: NEGATIVE
Resp Syncytial Virus by PCR: NEGATIVE
SARS Coronavirus 2 by RT PCR: NEGATIVE

## 2024-03-28 NOTE — ED Triage Notes (Signed)
 Pt in with cough and congestion x 3 days, low grade fever. Pt denies any sob or cp, does report some soreness to lower ribcage due to all the coughing. States productive sputum is yellow

## 2024-03-28 NOTE — Discharge Instructions (Addendum)
 Continue hydration and Tylenol 

## 2024-03-28 NOTE — ED Provider Notes (Signed)
 " [place] [facility] Provider Note   CSN: [id number] Arrival date & time: 03/28/24  1047     Patient presents with: Flu-Like Symptoms   Mark [name] is a 83 y.o. male.   Patient here with cough and congestion for 3 days low-grade fever.  Denies any shortness of breath or chest pain but does have some soreness to the low rib cage from coughing.  He has some sputum production.  History of hypertension high cholesterol CAD.  No nausea vomiting diarrhea.  The history is provided by the patient.       Prior to Admission medications  Medication Sig Start Date End Date Taking? Authorizing Provider  aspirin  EC 81 MG tablet Take 81 mg by mouth every morning.    [provider]  atorvastatin  (LIPITOR ) 40 MG tablet Take 40 mg by mouth daily.    [provider]  diclofenac Sodium (VOLTAREN) 1 % GEL Apply 1 application topically 4 (four) times daily as needed (pain).    [provider]  diphenhydrAMINE  (BENADRYL ) 25 MG tablet Take 25 mg by mouth every 6 (six) hours as needed for allergies.    [provider]  ferrous sulfate 325 (65 FE) MG tablet Take 325 mg by mouth every other day.    [provider]  FIBER PO Take 1 capsule by mouth 2 (two) times daily.    [provider]  gabapentin  (NEURONTIN ) 300 MG capsule Take 300 mg by mouth 2 (two) times daily.     [provider]  losartan-hydrochlorothiazide  (HYZAAR) 100-25 MG tablet Take 1 tablet by mouth daily. 12/12/22   [provider]  Multiple Vitamins-Minerals (PRESERVISION AREDS 2 PO) Take 1 capsule by mouth 2 (two) times daily.    [provider]  nitroGLYCERIN  (NITROSTAT ) 0.4 MG SL tablet Place 0.4 mg under the tongue every 5 (five) minutes as needed for chest pain.    [provider]  Omega-3 Fatty Acids (FISH OIL) 1000 MG CAPS Take 1,000 mg by mouth 2 (two) times daily.    [provider]  Phenylephrine   HCl (SINEX ULTRA FINE MIST REGULAR NA) Place 1 spray into both nostrils daily as needed (congestion).    [provider]  Polyvinyl Alcohol-Povidone (REFRESH OP) Place 1 drop into both eyes daily as needed (dry eyes).    [provider]  potassium chloride  (KLOR-CON ) 10 MEQ tablet Take 10 mEq by mouth 2 (two) times daily.    [provider]  vitamin B-12 (CYANOCOBALAMIN) 500 MCG tablet Take 500 mcg by mouth daily.    [provider]    Allergies: Codeine, Lisinopril, and Percocet [oxycodone -acetaminophen ]    Review of Systems  Updated Vital Signs BP 125/76 (BP Location: Right Arm)   Pulse 91   Temp 99 F (37.2 C) (Oral)   Resp 18   Wt 98.4 kg   SpO2 95%   BMI 32.04 kg/m   Physical Exam Vitals and nursing note reviewed.  Constitutional:      General: He is not in acute distress.    Appearance: He is well-developed. He is not ill-appearing.  HENT:     Head: Normocephalic and atraumatic.     Nose: Nose normal.     Mouth/Throat:     Mouth: Mucous membranes are moist.  Eyes:     Extraocular Movements: Extraocular movements intact.     Conjunctiva/sclera: Conjunctivae normal.     Pupils: Pupils are equal, round, and reactive to  light.  Cardiovascular:     Rate and Rhythm: Normal rate and regular rhythm.     Pulses: Normal pulses.     Heart sounds: Normal heart sounds. No murmur heard. Pulmonary:     Effort: Pulmonary effort is normal. No respiratory distress.     Breath sounds: Normal breath sounds.  Abdominal:     General: Abdomen is flat.     Palpations: Abdomen is soft.     Tenderness: There is no abdominal tenderness.  Musculoskeletal:        General: No swelling.     Cervical back: Normal range of motion and neck supple.  Skin:    General: Skin is warm and dry.     Capillary Refill: Capillary refill takes less than 2 seconds.  Neurological:     General: No focal deficit present.     Mental Status: He is alert and oriented to  person, place, and time.     Cranial Nerves: No cranial nerve deficit.     Sensory: No sensory deficit.     Motor: No weakness.     Coordination: Coordination normal.  Psychiatric:        Mood and Affect: Mood normal.     (all labs ordered are listed, but only abnormal results are displayed) Labs Reviewed  RESP PANEL BY RT-PCR (RSV, FLU A&B, COVID)  RVPGX2 - Abnormal; Notable for the following components:      Result Value   Influenza A by PCR POSITIVE (*)    All other components within normal limits    EKG: None  Radiology: No results found.   Procedures   Medications Ordered in the ED - No data to display                                  Medical Decision Making Amount and/or Complexity of Data Reviewed Radiology: ordered.   Mark [name] is here with flulike symptoms.  Normal vitals.  Low-grade fever.  Tylenol  prior to arrival.  He is positive for flu A.  Chest x-ray negative for pneumonia pneumothorax.  He is very well-appearing.  He has been vaccinated for flu this season.  He is mildly symptomatic.  I do not think he would benefit from Tamiflu given that he is already has several days of symptoms.  Is not having any GI symptoms.  Have no concern for any other acute process.  Continue supportive care at home.  Discharged in good condition.  Understands return precautions.  Tylenol  ibuprofen hydration.  His wife is sick with the same symptoms.  This chart was dictated using voice recognition software.  Despite best efforts to proofread,  errors can occur which can change the documentation meaning.      Final diagnoses:  Influenza A    ED Discharge Orders     None          Ruthe Cornet, DO 03/28/24 1216  "

## 2024-03-28 NOTE — ED Notes (Signed)
 ED Provider at bedside.

## 2024-04-14 NOTE — Progress Notes (Signed)
 " Cardiology Office Note:   Date:  04/14/2024  ID:  Mark [name], DOB 1941/02/21, MRN [id number] PCP: Mark Elsie SAUNDERS, MD  [facility] Providers Cardiologist:  Mark Archer, MD { Chief Complaint:  Chief Complaint  Patient presents with   Heart Problem      History of Present Illness:   Mark [name] is a 84 y.o. male with a PMH of CAD s/p PCI (2002), HTN, HLD, prostates CA who presents for follow up.  It is my first time meeting the patient today as he presents for follow-up.  He had a recent bout with the flu for which he saw some residual cough but otherwise feels much better.  Overall he says he doing very well denying chest pain, SOB, PND, orthopnea, palpitations.  He is taking all of his medications as prescribed without side effect.   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 rt lower leg / foot also has foot drop left foot wears brace   Prostate cancer (HCC) 06/17/13   gleason 3+4=7, vol 57.52 cc   Prostate cancer (HCC)    Sciatica    Sleep apnea    no longer needs c- pap per sleep study per pt     Studies Reviewed:    EKG:  EKG Interpretation Date/Time:  Tuesday April 15 2024 10:59:11 EST Ventricular Rate:  72 PR Interval:  182 QRS Duration:  76 QT Interval:  388 QTC Calculation: 424 R Axis:   31  Text Interpretation: Normal sinus rhythm with sinus arrhythmia Possible Inferior infarct , age undetermined When compared with ECG of 29-Jan-2023 14:14, No significant change was found Confirmed by [name] Mark [phone]) on 04/15/2024 11:00:56 AM     Cardiac Studies &  Procedures   ______________________________________________________________________________________________     ECHOCARDIOGRAM  ECHOCARDIOGRAM COMPLETE 12/25/2022  Narrative ECHOCARDIOGRAM REPORT    Patient Name:   Mark [name] Date of Exam: 12/25/2022 Medical Rec #:  [id number]         Height:       69.0 in Accession #:    [id number]        Weight:       215.0 lb Date of Birth:  1941-02-15         BSA:          2.130 m Patient Age:    82 years          BP:           132/70 mmHg Patient Gender: M                 HR:           55 bpm. Exam Location:  [address]  Procedure: 3D Echo, 2D Echo, Color Doppler, Cardiac Doppler and Strain Analysis  Indications:    R01.1 Murmur  History:        Patient has no prior history of Echocardiogram examinations. CAD; Risk Factors:Hypertension and Dyslipidemia.  Sonographer:    Mark  [name] RDCS Referring Phys: [id number] PHILIP J NAHSER  IMPRESSIONS   1. Left ventricular ejection fraction, by estimation, is 60 to 65%. Left ventricular ejection fraction by 3D volume is 66 %. The left ventricle has normal function. The left ventricle has no regional wall motion abnormalities. There is mild left ventricular hypertrophy. Left ventricular diastolic parameters are consistent with Grade I diastolic dysfunction (impaired relaxation). The average left ventricular global longitudinal strain is -23.4 %. The global longitudinal strain is normal. 2. Right ventricular systolic function is normal. The right ventricular size is normal. 3. The mitral valve is normal in structure. No evidence of mitral valve regurgitation. No evidence of mitral stenosis. 4. The aortic valve is calcified. There is mild calcification of the aortic valve. Aortic valve regurgitation is mild. Aortic valve sclerosis is present, with no evidence of aortic valve stenosis. Aortic regurgitation PHT measures 652 msec. 5. The inferior vena cava is normal in size with greater than 50%  respiratory variability, suggesting right atrial pressure of 3 mmHg.  FINDINGS Left Ventricle: Left ventricular ejection fraction, by estimation, is 60 to 65%. Left ventricular ejection fraction by 3D volume is 66 %. The left ventricle has normal function. The left ventricle has no regional wall motion abnormalities. The average left ventricular global longitudinal strain is -23.4 %. The global longitudinal strain is normal. The left ventricular internal cavity size was normal in size. There is mild left ventricular hypertrophy. Left ventricular diastolic parameters are consistent with Grade I diastolic dysfunction (impaired relaxation).  Right Ventricle: The right ventricular size is normal. No increase in right ventricular wall thickness. Right ventricular systolic function is normal.  Left Atrium: Left atrial size was normal in size.  Right Atrium: Right atrial size was normal in size.  Pericardium: There is no evidence of pericardial effusion.  Mitral Valve: The mitral valve is normal in structure. Mild mitral annular calcification. No evidence of mitral valve regurgitation. No evidence of mitral valve stenosis.  Tricuspid Valve: The tricuspid valve is normal in structure. Tricuspid valve regurgitation is not demonstrated. No evidence of tricuspid stenosis.  Aortic Valve: The aortic valve is calcified. There is mild calcification of the aortic valve. Aortic valve regurgitation is mild. Aortic regurgitation PHT measures 652 msec. Aortic valve sclerosis is present, with no evidence of aortic valve stenosis.  Pulmonic Valve: The pulmonic valve was normal in structure. Pulmonic valve regurgitation is not visualized. No evidence of pulmonic stenosis.  Aorta: The aortic root is normal in size and structure.  Venous: The inferior vena cava is normal in size with greater than 50% respiratory variability, suggesting right atrial pressure of 3 mmHg.  IAS/Shunts: No atrial level shunt detected by  color flow Doppler.   LEFT VENTRICLE PLAX 2D LVIDd:         4.00 cm         Diastology LVIDs:         2.80 cm         LV e' medial:    7.40 cm/s LV PW:         1.10 cm         LV E/e' medial:  12.2 LV IVS:        1.20 cm         LV e' lateral:   6.74 cm/s LVOT diam:     2.10 cm         LV E/e' lateral: 13.4 LV SV:         85 LV SV  Index:   40              2D LVOT Area:     3.46 cm        Longitudinal Strain 2D Strain GLS  -23.4 % Avg:  3D Volume EF LV 3D EF:    Left ventricul ar ejection fraction by 3D volume is 66 %.  3D Volume EF: 3D EF:        66 % LV EDV:       109 ml LV ESV:       37 ml LV SV:        72 ml  RIGHT VENTRICLE             IVC RV Basal diam:  3.70 cm     IVC diam: 2.00 cm RV Mid diam:    3.50 cm RV S prime:     12.00 cm/s TAPSE (M-mode): 1.8 cm  LEFT ATRIUM             Index        RIGHT ATRIUM           Index LA diam:        4.10 cm 1.92 cm/m   RA Area:     16.90 cm LA Vol (A2C):   61.8 ml 29.01 ml/m  RA Volume:   40.20 ml  18.87 ml/m LA Vol (A4C):   55.5 ml 26.05 ml/m LA Biplane Vol: 59.4 ml 27.88 ml/m AORTIC VALVE LVOT Vmax:   98.00 cm/s LVOT Vmean:  61.800 cm/s LVOT VTI:    0.245 m AI PHT:      652 msec  AORTA Ao Root diam: 3.60 cm Ao Asc diam:  3.70 cm  MITRAL VALVE MV Area (PHT): 3.03 cm     SHUNTS MV Decel Time: 250 msec     Systemic VTI:  0.24 m MV E velocity: 90.10 cm/s   Systemic Diam: 2.10 cm MV A velocity: 114.00 cm/s MV E/A ratio:  0.79  Oneil Parchment MD Electronically signed by Oneil Parchment MD Signature Date/Time: 12/25/2022/1:13:06 PM    Final          ______________________________________________________________________________________________      Risk Assessment/Calculations:              Physical Exam:     VS:  BP 110/60   Pulse 72   Ht 5' 9 (1.753 m)   Wt 214 lb 6.4 oz (97.3 kg)   SpO2 97%   BMI 31.66 kg/m      Wt Readings from Last 3 Encounters:  03/28/24 216 lb 14.9 oz (98.4 kg)   02/22/23 217 lb (98.4 kg)  01/29/23 214 lb 15.2 oz (97.5 kg)     GEN: Well nourished, well developed, in no acute distress NECK: No JVD; No carotid bruits CARDIAC: RRR, no murmurs, rubs, gallops RESPIRATORY:  Clear to auscultation without rales, wheezing or rhonchi  ABDOMEN: Soft, non-tender, non-distended, normal bowel sounds EXTREMITIES:  Warm and well perfused, trace bilateral pitting edema; No deformity, 2+ radial pulses PSYCH: Normal mood and affect   Assessment & Plan   #CAD s/p PCI to LAD (2002) - Prior PCI without any ongoing chest pain or cardiopulmonary symptoms. - No changes. Continue aspirin  81 mg indefinitely Follow-up in 12 months Refilled nitroglycerin   #HTN - Blood pressure is perfect.  No changes. Continue losartan 100 mg daily Continue HCTZ 25 mg daily Check BMP, magnesium  #HLD - Due for lipid check. 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 -Can repeat echocardiogram in ~3 years for surveillance         This note was written with the assistance of a dictation microphone or AI dictation software. Please excuse any typos or grammatical errors.   Signed, Mark Archer, MD 04/14/2024 6:08 PM    [facility]  "

## 2024-04-15 ENCOUNTER — Encounter: Payer: Self-pay | Admitting: Student in an Organized Health Care Education/Training Program

## 2024-04-15 ENCOUNTER — Ambulatory Visit: Admitting: Student in an Organized Health Care Education/Training Program

## 2024-04-15 VITALS — BP 110/60 | HR 72 | Ht 69.0 in | Wt 214.4 lb

## 2024-04-15 DIAGNOSIS — M7989 Other specified soft tissue disorders: Secondary | ICD-10-CM | POA: Diagnosis not present

## 2024-04-15 DIAGNOSIS — I10 Essential (primary) hypertension: Secondary | ICD-10-CM

## 2024-04-15 DIAGNOSIS — R011 Cardiac murmur, unspecified: Secondary | ICD-10-CM | POA: Diagnosis not present

## 2024-04-15 DIAGNOSIS — I2510 Atherosclerotic heart disease of native coronary artery without angina pectoris: Secondary | ICD-10-CM

## 2024-04-15 DIAGNOSIS — I351 Nonrheumatic aortic (valve) insufficiency: Secondary | ICD-10-CM | POA: Diagnosis not present

## 2024-04-15 DIAGNOSIS — E785 Hyperlipidemia, unspecified: Secondary | ICD-10-CM | POA: Diagnosis not present

## 2024-04-15 MED ORDER — NITROGLYCERIN 0.4 MG SL SUBL: 0.4000 mg | SUBLINGUAL_TABLET | SUBLINGUAL | 3 refills | Status: AC | PRN

## 2024-04-15 NOTE — Patient Instructions (Signed)
 Medication Instructions:  Refilled nitroglycerin    *If you need a refill on your cardiac medications before your next appointment, please call your pharmacy*  Lab Work: BMP MAGNESIUM  LIPID PANEL   If you have labs (blood work) drawn today and your tests are completely normal, you will receive your results only by: MyChart Message (if you have MyChart) OR A paper copy in the mail If you have any lab test that is abnormal or we need to change your treatment, we will call you to review the results.  Follow-Up: At [facility], you and your health needs are our priority.  As part of our continuing mission to provide you with exceptional heart care, our providers are all part of one team.  This team includes your primary Cardiologist (physician) and Advanced Practice Providers or APPs (Physician Assistants and Nurse Practitioners) who all work together to provide you with the care you need, when you need it.  Your next appointment:   12 month(s)  Provider:   Georganna Archer, MD

## 2024-04-16 LAB — BASIC METABOLIC PANEL WITH GFR
BUN/Creatinine Ratio: 15 (ref 10–24)
BUN: 16 mg/dL (ref 8–27)
CO2: 26 mmol/L (ref 20–29)
Calcium: 9.4 mg/dL (ref 8.6–10.2)
Chloride: 101 mmol/L (ref 96–106)
Creatinine, Ser: 1.08 mg/dL (ref 0.76–1.27)
Glucose: 127 mg/dL — ABNORMAL HIGH (ref 70–99)
Potassium: 4.3 mmol/L (ref 3.5–5.2)
Sodium: 142 mmol/L (ref 134–144)
eGFR: 69 mL/min/1.73

## 2024-04-16 LAB — LIPID PANEL
Chol/HDL Ratio: 3.3 ratio (ref 0.0–5.0)
Cholesterol, Total: 98 mg/dL — ABNORMAL LOW (ref 100–199)
HDL: 30 mg/dL — ABNORMAL LOW
LDL Chol Calc (NIH): 35 mg/dL (ref 0–99)
Triglycerides: 207 mg/dL — ABNORMAL HIGH (ref 0–149)
VLDL Cholesterol Cal: 33 mg/dL (ref 5–40)

## 2024-04-16 LAB — MAGNESIUM: Magnesium: 1.6 mg/dL (ref 1.6–2.3)

## 2024-04-17 ENCOUNTER — Ambulatory Visit: Payer: Self-pay | Admitting: Student in an Organized Health Care Education/Training Program
